# Patient Record
Sex: Female | Born: 1935 | Race: Black or African American | Hispanic: No | Smoking: Never smoker
Health system: Southern US, Community
[De-identification: ages and names within clinical notes are randomized; demographics above are authoritative.]

## PROBLEM LIST (undated history)

## (undated) DIAGNOSIS — I4891 Unspecified atrial fibrillation: Secondary | ICD-10-CM

## (undated) DIAGNOSIS — I2510 Atherosclerotic heart disease of native coronary artery without angina pectoris: Secondary | ICD-10-CM

## (undated) DIAGNOSIS — Z950 Presence of cardiac pacemaker: Secondary | ICD-10-CM

## (undated) DIAGNOSIS — D649 Anemia, unspecified: Secondary | ICD-10-CM

## (undated) DIAGNOSIS — J45909 Unspecified asthma, uncomplicated: Secondary | ICD-10-CM

## (undated) DIAGNOSIS — I639 Cerebral infarction, unspecified: Secondary | ICD-10-CM

## (undated) DIAGNOSIS — C801 Malignant (primary) neoplasm, unspecified: Secondary | ICD-10-CM

## (undated) DIAGNOSIS — K635 Polyp of colon: Secondary | ICD-10-CM

## (undated) HISTORY — PX: CARDIAC SURGERY: SHX584

## (undated) HISTORY — PX: CORONARY ANGIOPLASTY WITH STENT PLACEMENT: SHX49

## (undated) HISTORY — PX: CORONARY ARTERY BYPASS GRAFT: SHX141

## (undated) HISTORY — PX: ABDOMINAL HYSTERECTOMY: SHX81

---

## 2011-08-18 ENCOUNTER — Emergency Department (HOSPITAL_COMMUNITY): Payer: Medicare Other

## 2011-08-18 ENCOUNTER — Inpatient Hospital Stay (HOSPITAL_COMMUNITY)
Admission: EM | Admit: 2011-08-18 | Discharge: 2011-08-23 | DRG: 064 | Disposition: A | Discharge status: Rehabilitation Facility | Payer: Medicare Other | Attending: Neurology | Admitting: Neurology

## 2011-08-18 ENCOUNTER — Encounter (HOSPITAL_COMMUNITY): Payer: Self-pay | Admitting: *Deleted

## 2011-08-18 DIAGNOSIS — I639 Cerebral infarction, unspecified: Secondary | ICD-10-CM | POA: Diagnosis not present

## 2011-08-18 DIAGNOSIS — D649 Anemia, unspecified: Secondary | ICD-10-CM

## 2011-08-18 DIAGNOSIS — Z951 Presence of aortocoronary bypass graft: Secondary | ICD-10-CM

## 2011-08-18 DIAGNOSIS — Z8673 Personal history of transient ischemic attack (TIA), and cerebral infarction without residual deficits: Secondary | ICD-10-CM

## 2011-08-18 DIAGNOSIS — F29 Unspecified psychosis not due to a substance or known physiological condition: Secondary | ICD-10-CM

## 2011-08-18 DIAGNOSIS — E119 Type 2 diabetes mellitus without complications: Secondary | ICD-10-CM | POA: Diagnosis present

## 2011-08-18 DIAGNOSIS — Z950 Presence of cardiac pacemaker: Secondary | ICD-10-CM | POA: Diagnosis present

## 2011-08-18 DIAGNOSIS — Z9889 Other specified postprocedural states: Secondary | ICD-10-CM

## 2011-08-18 DIAGNOSIS — R531 Weakness: Secondary | ICD-10-CM | POA: Diagnosis present

## 2011-08-18 DIAGNOSIS — N289 Disorder of kidney and ureter, unspecified: Secondary | ICD-10-CM | POA: Diagnosis present

## 2011-08-18 DIAGNOSIS — IMO0001 Reserved for inherently not codable concepts without codable children: Secondary | ICD-10-CM | POA: Diagnosis present

## 2011-08-18 DIAGNOSIS — E876 Hypokalemia: Secondary | ICD-10-CM | POA: Diagnosis present

## 2011-08-18 DIAGNOSIS — Z7982 Long term (current) use of aspirin: Secondary | ICD-10-CM

## 2011-08-18 DIAGNOSIS — J449 Chronic obstructive pulmonary disease, unspecified: Secondary | ICD-10-CM | POA: Diagnosis present

## 2011-08-18 DIAGNOSIS — I509 Heart failure, unspecified: Secondary | ICD-10-CM | POA: Diagnosis present

## 2011-08-18 DIAGNOSIS — N390 Urinary tract infection, site not specified: Secondary | ICD-10-CM | POA: Diagnosis present

## 2011-08-18 DIAGNOSIS — R29810 Facial weakness: Secondary | ICD-10-CM | POA: Diagnosis present

## 2011-08-18 DIAGNOSIS — J45909 Unspecified asthma, uncomplicated: Secondary | ICD-10-CM | POA: Diagnosis present

## 2011-08-18 DIAGNOSIS — I2510 Atherosclerotic heart disease of native coronary artery without angina pectoris: Secondary | ICD-10-CM | POA: Diagnosis present

## 2011-08-18 DIAGNOSIS — I6521 Occlusion and stenosis of right carotid artery: Secondary | ICD-10-CM

## 2011-08-18 DIAGNOSIS — G8190 Hemiplegia, unspecified affecting unspecified side: Secondary | ICD-10-CM | POA: Diagnosis present

## 2011-08-18 DIAGNOSIS — R5381 Other malaise: Secondary | ICD-10-CM

## 2011-08-18 DIAGNOSIS — R410 Disorientation, unspecified: Secondary | ICD-10-CM | POA: Diagnosis present

## 2011-08-18 DIAGNOSIS — J4489 Other specified chronic obstructive pulmonary disease: Secondary | ICD-10-CM | POA: Diagnosis present

## 2011-08-18 DIAGNOSIS — G9340 Encephalopathy, unspecified: Secondary | ICD-10-CM | POA: Diagnosis present

## 2011-08-18 DIAGNOSIS — R739 Hyperglycemia, unspecified: Secondary | ICD-10-CM

## 2011-08-18 DIAGNOSIS — Z87898 Personal history of other specified conditions: Secondary | ICD-10-CM | POA: Diagnosis present

## 2011-08-18 DIAGNOSIS — I63239 Cerebral infarction due to unspecified occlusion or stenosis of unspecified carotid arteries: Principal | ICD-10-CM | POA: Diagnosis present

## 2011-08-18 HISTORY — DX: Unspecified asthma, uncomplicated: J45.909

## 2011-08-18 HISTORY — DX: Cerebral infarction, unspecified: I63.9

## 2011-08-18 HISTORY — DX: Presence of cardiac pacemaker: Z95.0

## 2011-08-18 HISTORY — DX: Atherosclerotic heart disease of native coronary artery without angina pectoris: I25.10

## 2011-08-18 LAB — URINALYSIS, ROUTINE W REFLEX MICROSCOPIC
Ketones, ur: NEGATIVE mg/dL
Nitrite: NEGATIVE
Protein, ur: 30 mg/dL — AB
Urobilinogen, UA: 0.2 mg/dL (ref 0.0–1.0)

## 2011-08-18 LAB — CBC WITH DIFFERENTIAL/PLATELET
Basophils Relative: 0 % (ref 0–1)
Eosinophils Absolute: 0 10*3/uL (ref 0.0–0.7)
Eosinophils Relative: 0 % (ref 0–5)
Lymphs Abs: 0.7 10*3/uL (ref 0.7–4.0)
MCH: 24.5 pg — ABNORMAL LOW (ref 26.0–34.0)
MCHC: 31.8 g/dL (ref 30.0–36.0)
MCV: 77 fL — ABNORMAL LOW (ref 78.0–100.0)
Monocytes Absolute: 0.7 10*3/uL (ref 0.1–1.0)
Neutrophils Relative %: 86 % — ABNORMAL HIGH (ref 43–77)
Platelets: 207 10*3/uL (ref 150–400)
RBC: 4 MIL/uL (ref 3.87–5.11)
WBC Morphology: INCREASED

## 2011-08-18 LAB — COMPREHENSIVE METABOLIC PANEL
Alkaline Phosphatase: 75 U/L (ref 39–117)
BUN: 29 mg/dL — ABNORMAL HIGH (ref 6–23)
Chloride: 105 mEq/L (ref 96–112)
Creatinine, Ser: 1.43 mg/dL — ABNORMAL HIGH (ref 0.50–1.10)
GFR calc Af Amer: 37 mL/min — ABNORMAL LOW (ref >90)
Glucose, Bld: 355 mg/dL — ABNORMAL HIGH (ref 70–99)
Potassium: 2.9 mEq/L — ABNORMAL LOW (ref 3.5–5.1)
Total Bilirubin: 1.5 mg/dL — ABNORMAL HIGH (ref 0.3–1.2)

## 2011-08-18 LAB — TROPONIN I: Troponin I: <0.3 ng/mL (ref <0.30)

## 2011-08-18 LAB — URINE MICROSCOPIC-ADD ON

## 2011-08-18 LAB — RETICULOCYTES: Retic Ct Pct: 1 % (ref 0.4–3.1)

## 2011-08-18 LAB — PRO B NATRIURETIC PEPTIDE: Pro B Natriuretic peptide (BNP): 7537 pg/mL — ABNORMAL HIGH (ref 0–450)

## 2011-08-18 LAB — PROCALCITONIN: Procalcitonin: 3.78 ng/mL

## 2011-08-18 MED ORDER — ONDANSETRON HCL 4 MG PO TABS: 4.0000 mg | ORAL_TABLET | Freq: Four times a day (QID) | ORAL | Status: DC | PRN

## 2011-08-18 MED ORDER — POTASSIUM CHLORIDE CRYS ER 20 MEQ PO TBCR
20.0000 meq | EXTENDED_RELEASE_TABLET | Freq: Two times a day (BID) | ORAL | Status: DC
  Administered 2011-08-18 – 2011-08-23 (×8): 20 meq via ORAL
  Filled 2011-08-18 (×10): qty 1

## 2011-08-18 MED ORDER — SODIUM CHLORIDE 0.9 % IJ SOLN
3.0000 mL | Freq: Two times a day (BID) | INTRAMUSCULAR | Status: DC
  Administered 2011-08-20 – 2011-08-22 (×6): 3 mL via INTRAVENOUS

## 2011-08-18 MED ORDER — DEXTROSE 5 % IV SOLN
1.0000 g | INTRAVENOUS | Status: DC
  Administered 2011-08-19 – 2011-08-22 (×4): 1 g via INTRAVENOUS
  Filled 2011-08-18 (×6): qty 10

## 2011-08-18 MED ORDER — ENOXAPARIN SODIUM 30 MG/0.3ML ~~LOC~~ SOLN
30.0000 mg | SUBCUTANEOUS | Status: DC
  Administered 2011-08-18: 30 mg via SUBCUTANEOUS
  Filled 2011-08-18 (×2): qty 0.3

## 2011-08-18 MED ORDER — ISOSORBIDE MONONITRATE ER 30 MG PO TB24
30.0000 mg | ORAL_TABLET | Freq: Every day | ORAL | Status: DC
  Administered 2011-08-19 – 2011-08-23 (×4): 30 mg via ORAL
  Filled 2011-08-18 (×6): qty 1

## 2011-08-18 MED ORDER — LATANOPROST 0.005 % OP SOLN
1.0000 [drp] | Freq: Every day | OPHTHALMIC | Status: DC
  Administered 2011-08-20 – 2011-08-22 (×3): 1 [drp] via OPHTHALMIC
  Filled 2011-08-18 (×2): qty 2.5

## 2011-08-18 MED ORDER — ALBUTEROL SULFATE (5 MG/ML) 0.5% IN NEBU
2.5000 mg | INHALATION_SOLUTION | Freq: Four times a day (QID) | RESPIRATORY_TRACT | Status: DC | PRN
  Administered 2011-08-19: 2.5 mg via RESPIRATORY_TRACT
  Filled 2011-08-18: qty 0.5

## 2011-08-18 MED ORDER — SODIUM CHLORIDE 0.9 % IJ SOLN
3.0000 mL | Freq: Two times a day (BID) | INTRAMUSCULAR | Status: DC
  Administered 2011-08-18: 3 mL via INTRAVENOUS
  Filled 2011-08-18: qty 3

## 2011-08-18 MED ORDER — SODIUM CHLORIDE 0.9 % IV SOLN: 250.0000 mL | INTRAVENOUS | Status: DC | PRN

## 2011-08-18 MED ORDER — SODIUM CHLORIDE 0.9 % IJ SOLN: 3.0000 mL | INTRAMUSCULAR | Status: DC | PRN

## 2011-08-18 MED ORDER — ASPIRIN EC 81 MG PO TBEC
81.0000 mg | DELAYED_RELEASE_TABLET | Freq: Every day | ORAL | Status: DC
  Administered 2011-08-19: 81 mg via ORAL
  Filled 2011-08-18 (×2): qty 1

## 2011-08-18 MED ORDER — CLONIDINE HCL 0.2 MG/24HR TD PTWK
0.2000 mg | MEDICATED_PATCH | TRANSDERMAL | Status: DC
  Administered 2011-08-18: 0.2 mg via TRANSDERMAL

## 2011-08-18 MED ORDER — INSULIN ASPART 100 UNIT/ML ~~LOC~~ SOLN
0.0000 [IU] | Freq: Three times a day (TID) | SUBCUTANEOUS | Status: DC
  Administered 2011-08-19: 5 [IU] via SUBCUTANEOUS

## 2011-08-18 MED ORDER — CLONIDINE HCL 0.2 MG/24HR TD PTWK
MEDICATED_PATCH | TRANSDERMAL | Status: AC
  Filled 2011-08-18: qty 1

## 2011-08-18 MED ORDER — DEXTROSE 5 % IV SOLN
1.0000 g | Freq: Once | INTRAVENOUS | Status: AC
  Administered 2011-08-18: 1 g via INTRAVENOUS
  Filled 2011-08-18: qty 10

## 2011-08-18 MED ORDER — ONDANSETRON HCL 4 MG/2ML IJ SOLN
4.0000 mg | Freq: Four times a day (QID) | INTRAMUSCULAR | Status: DC | PRN
  Administered 2011-08-19: 4 mg via INTRAVENOUS
  Filled 2011-08-18: qty 2

## 2011-08-18 MED ORDER — METOPROLOL TARTRATE 50 MG PO TABS
50.0000 mg | ORAL_TABLET | Freq: Two times a day (BID) | ORAL | Status: DC
  Administered 2011-08-18: 50 mg via ORAL
  Filled 2011-08-18: qty 1

## 2011-08-18 MED ORDER — ENOXAPARIN SODIUM 40 MG/0.4ML ~~LOC~~ SOLN: 40.0000 mg | SUBCUTANEOUS | Status: DC

## 2011-08-18 NOTE — ED Notes (Signed)
Pt c/o SOB, polyuria as well as malodorous urine. Pt denied any pain or discomfort. Pt does not appear to be in distress. Pt has been taking albuterol for SOB with relief however, it has not been affective in past few days.

## 2011-08-18 NOTE — H&P (Signed)
Chief Complaint:  weak  HPI: 78 yo female h/o cri, chf unknown ef, s/p cabg, who lives with her son who is taking excellent care of her and is very knowledgeable about her health problems comes in with several days of worsening generalized weakness, low grade fevers, increase in urinary frequency and new incontinence along with a very fowl smelling odor to urine.  At her baseline she gets up and moves around very well walking around the house without a walker but the last several days has been basically bed bound and needing much more help to get up.  Son gives her her diuretics prn based on how much swelling she has in her legs, there has been no swelling in her legs over the last several weeks.  Has not noticed any worsening sob, patient does not complain of any cp, sob, n/v,d.  She does not frequently require abx tx for infections.  There has been no notice of slurred speech or any focal weakness in any extremeties.  No bleeding issues.  She has also had decreased po intake for several days.  Review of Systems:  O/w neg  Past Medical History: Past Medical History  Diagnosis Date  . Coronary artery disease   . Diabetes mellitus   . Pacemaker   . Asthma    Past Surgical History  Procedure Date  . Cardiac surgery   . Coronary angioplasty with stent placement   . Coronary artery bypass graft   . Abdominal hysterectomy   . Colon polpys     Medications: Prior to Admission medications   Medication Sig Start Date End Date Taking? Authorizing Provider  aspirin EC 81 MG tablet Take 81 mg by mouth every morning.   Yes Historical Provider, MD  bumetanide (BUMEX) 1 MG tablet Take 1 mg by mouth daily. *Only take if fluid retention is current. Provided via son discretion. Patient is to take Klor-Con with this medication*   Yes Historical Provider, MD  cloNIDine (CATAPRES - DOSED IN MG/24 HR) 0.2 mg/24hr patch Place 1 patch onto the skin once a week.   Yes Historical Provider, MD    insulin lispro (HUMALOG) 100 UNIT/ML injection Inject 5-10 Units into the skin 3 (three) times daily before meals. *Inject 5 units in the morning and 10 units at lunch and at dinner*   Yes Historical Provider, MD  isosorbide mononitrate (IMDUR) 30 MG 24 hr tablet Take 30 mg by mouth daily.   Yes Historical Provider, MD  metoprolol (LOPRESSOR) 50 MG tablet Take 50 mg by mouth 2 (two) times daily.   Yes Historical Provider, MD  potassium chloride SA (K-DUR,KLOR-CON) 20 MEQ tablet Take 20 mEq by mouth daily. *To be taken when Bumex is taken for fluid retention*   Yes Historical Provider, MD    Allergies:   Allergies  Allergen Reactions  . Lisinopril     Social History:  reports that she has never smoked. She does not have any smokeless tobacco history on file. She reports that she does not drink alcohol or use illicit drugs.  Family History: History reviewed. No pertinent family history.  Physical Exam: Filed Vitals:   08/18/11 1445 08/18/11 1735 08/18/11 1927 08/18/11 2040  BP: 124/44  139/60 165/71  Pulse: 59  59   Temp: 100 F (37.8 C) 99.5 F (37.5 C)  98.4 F (36.9 C)  TempSrc: Oral Rectal  Oral  Resp: 20  18 17   Height:    5\' 2"  (1.575 m)  Weight:  59.1 kg (130 lb 4.7 oz)  SpO2: 100%  99%    General appearance: alert, cooperative and no distress Neck: no JVD and supple, symmetrical, trachea midline Lungs: clear to auscultation bilaterally Heart: regular rate and rhythm, S1, S2 normal, no murmur, click, rub or gallop Abdomen: soft, non-tender; bowel sounds normal; no masses,  no organomegaly Extremities: extremities normal, atraumatic, no cyanosis or edema Pulses: 2+ and symmetric Skin: Skin color, texture, turgor normal. No rashes or lesions Neurologic: Grossly normal    Labs on Admission:   [facility] 08/18/11 1658  NA 144  K 2.9*  CL 105  CO2 28  GLUCOSE 355*  BUN 29*  CREATININE 1.43*  CALCIUM 8.8  MG --  PHOS --    Basename 08/18/11 1658  AST 15   ALT 11  ALKPHOS 75  BILITOT 1.5*  PROT 6.2  ALBUMIN 2.4*    Basename 08/18/11 1658  WBC 10.6*  NEUTROABS 9.2*  HGB 9.8*  HCT 30.8*  MCV 77.0*  PLT 207    Basename 08/18/11 1658  CKTOTAL --  CKMB --  CKMBINDEX --  TROPONINI <0.30   Radiological Exams on Admission: Dg Chest Portable 1 View  08/18/2011  *RADIOLOGY REPORT*  Clinical Data: Shortness of breath.  PORTABLE CHEST - 1 VIEW  Comparison: None.  Findings: Trachea is midline.  Heart is enlarged.  Pacemaker lead tip projects over the right ventricle.  Lungs are clear.  No pleural fluid.  IMPRESSION: No acute findings.  Original Report Authenticated By: Reyes Ivan, M.D.    Assessment/Plan Present on Admission:  78 yo female with generalized weakness from uti .UTI (urinary tract infection) .Confusion .Generalized weakness .Renal insufficiency, mild .Coronary artery disease .Asthma .Anemia .Hypokalemia .Diabetes mellitus .CHF, chronic .S/P placement of cardiac pacemaker .H/O angioedema  rocephin for uti and cx sent.  Her chf is compensated and will ck an echo to assess her lv function.  No prior visits here so no old records.  Mild renal insuff no baseline.  Will hold off on ivf due to her h/o chf unknown EF as she is still taking po intake.  Hold diuretics.  Ck anemia panel.  Place on ssi.  Obtain PT eval.  Son has no help at home, will need HH/pt set up at d/c for home.  Increase her po potassium intake.  DAVID,RACHAL A [id number] 08/18/2011, 9:17 PM

## 2011-08-18 NOTE — ED Provider Notes (Cosign Needed)
History    This chart was scribed for Ward Givens, MD, MD by Smitty Pluck. The patient was seen in room APA14 and the patient's care was started at 4:20PM.   CSN: [id number]  Arrival date & time 08/18/11  1431   First MD Initiated Contact with Patient 08/18/11 1550      Chief Complaint  Patient presents with  . Shortness of Breath    (Consider location/radiation/quality/duration/timing/severity/associated sxs/prior Treatment) Level V caveat for confusion  The history is provided by the patient and a relative.   Cindy Abbott is a 78 y.o. female who presents to the Emergency Department BIB family due to increased urinary incontinence, increased frequency of urination with foul smell and SOB onset 7 days ago. Pt's son reports pt's urine is dark  in color and has odor. Son reports that pt has had increased confusion within the past week. Pt is ambulatory at home. Patient has chronic shortness of breath. She denies chest pain, nausea, and diarrhea, swelling of her feet. Son denies fever that he is aware of.    Pt has hx pacemaker, bypass surgery, 5 stints placed to prepare her for polyp removal surgery at [facility].   PCP is Dr. Graciela Husbands at [facility] in [place], [place]   Past Medical History  Diagnosis Date  . Coronary artery disease   . Diabetes mellitus   . Pacemaker   . Asthma     Past Surgical History  Procedure Date  . Cardiac surgery   . Coronary angioplasty with stent placement x5 stents    . Coronary artery bypass graft  2000  . Abdominal hysterectomy   . Colon polpys   Pacemaker--[employer]  History reviewed. No pertinent family history.  History  Substance Use Topics  . Smoking status: Never Smoker   . Smokeless tobacco: Not on file  . Alcohol Use: No  lives with son  OB History    Grav Para Term Preterm Abortions TAB SAB Ect Mult Living                  Review of Systems  All other systems reviewed and are negative.  10 Systems reviewed and all are  negative for acute change except as noted in the HPI.    Allergies  Lisinopril  Home Medications   Current Outpatient Rx  Name Route Sig Dispense Refill  . ASPIRIN EC 81 MG PO TBEC Oral Take 81 mg by mouth every morning.    . BUMETANIDE 1 MG PO TABS Oral Take 1 mg by mouth daily. *Only take if fluid retention is current. Provided via son discretion. Patient is to take Klor-Con with this medication*    . CLONIDINE HCL 0.2 MG/24HR TD PTWK Transdermal Place 1 patch onto the skin once a week.    . INSULIN LISPRO (HUMAN) 100 UNIT/ML [place] SOLN Subcutaneous Inject 5-10 Units into the skin 3 (three) times daily before meals. *Inject 5 units in the morning and 10 units at lunch and at dinner*    . ISOSORBIDE MONONITRATE ER 30 MG PO TB24 Oral Take 30 mg by mouth daily.    Marland Kitchen METOPROLOL TARTRATE 50 MG PO TABS Oral Take 50 mg by mouth 2 (two) times daily.    Marland Kitchen POTASSIUM CHLORIDE CRYS ER 20 MEQ PO TBCR Oral Take 20 mEq by mouth daily. *To be taken when Bumex is taken for fluid retention*      BP 124/44  Pulse 59  Temp 100 F (37.8 C) (Oral)  Resp 20  SpO2 100%  Vital signs normal borderline bradycardia   Physical Exam  Nursing note and vitals reviewed. Constitutional: She appears well-developed and well-nourished.  Non-toxic appearance. She does not appear ill. No distress.       Seems generally weak   HENT:  Head: Normocephalic and atraumatic.  Right Ear: External ear normal.  Left Ear: External ear normal.  Nose: Nose normal. No mucosal edema or rhinorrhea.  Mouth/Throat: Oropharynx is clear and moist and mucous membranes are normal. No dental abscesses or uvula swelling.       Tongue is dry  Eyes: Conjunctivae and EOM are normal. Pupils are equal, round, and reactive to light.  Neck: Normal range of motion and full passive range of motion without pain. Neck supple.  Cardiovascular: Normal rate, regular rhythm and normal heart sounds.  Exam reveals no gallop and no friction rub.     No murmur heard.      harsh crescendo systolic murmur   Pulmonary/Chest: Breath sounds normal. Tachypnea noted. No respiratory distress. She has no wheezes. She has no rhonchi. She has no rales. She exhibits no tenderness and no crepitus.  Abdominal: Soft. Normal appearance and bowel sounds are normal. She exhibits no distension. There is no tenderness. There is no rebound and no guarding.  Musculoskeletal: Normal range of motion. She exhibits no edema and no tenderness.       Moves all extremities well.   Neurological: She is alert. She has normal strength. No cranial nerve deficit.       Pt thinks she is at [facility] in [place]  Skin: Skin is warm, dry and intact. No rash noted. No erythema. No pallor.  Psychiatric: She has a normal mood and affect. Her speech is normal and behavior is normal. Her mood appears not anxious.       Flat affect     ED Course  Procedures (including critical care time)   Medications  cefTRIAXone (ROCEPHIN) 1 g in dextrose 5 % 50 mL IVPB (1 g Intravenous New Bag/Given 08/18/11 1850)    DIAGNOSTIC STUDIES: Oxygen Saturation is 100% on room air, normal by my interpretation.    COORDINATION OF CARE: 4:31PM EDP discusses pt ED treatment with pt  6:48PM EDP rechecks pt and discuss lab results with pt's son  1905 Dr Onalee Hua, admit to tele, team 1  Results for orders placed during the hospital encounter of 08/18/11  CBC WITH DIFFERENTIAL      Component Value Range   WBC 10.6 (*) 4.0 - 10.5 K/uL   RBC 4.00  3.87 - 5.11 MIL/uL   Hemoglobin 9.8 (*) 12.0 - 15.0 g/dL   HCT 16.1 (*) 09.6 - 04.5 %   MCV 77.0 (*) 78.0 - 100.0 fL   MCH 24.5 (*) 26.0 - 34.0 pg   MCHC 31.8  30.0 - 36.0 g/dL   RDW 40.9 (*) 81.1 - 91.4 %   Platelets 207  150 - 400 K/uL   Neutrophils Relative 86 (*) 43 - 77 %   Lymphocytes Relative 7 (*) 12 - 46 %   Monocytes Relative 7  3 - 12 %   Eosinophils Relative 0  0 - 5 %   Basophils Relative 0  0 - 1 %   Neutro Abs 9.2 (*) 1.7 - 7.7 K/uL   Lymphs  Abs 0.7  0.7 - 4.0 K/uL   Monocytes Absolute 0.7  0.1 - 1.0 K/uL   Eosinophils Absolute 0.0  0.0 - 0.7 K/uL  Basophils Absolute 0.0  0.0 - 0.1 K/uL   RBC Morphology SCHISTOCYTES PRESENT (2-5/hpf)     WBC Morphology INCREASED BANDS (>20% BANDS)     Smear Review LARGE PLATELETS PRESENT    COMPREHENSIVE METABOLIC PANEL      Component Value Range   Sodium 144  135 - 145 mEq/L   Potassium 2.9 (*) 3.5 - 5.1 mEq/L   Chloride 105  96 - 112 mEq/L   CO2 28  19 - 32 mEq/L   Glucose, Bld 355 (*) 70 - 99 mg/dL   BUN 29 (*) 6 - 23 mg/dL   Creatinine, Ser 1.61 (*) 0.50 - 1.10 mg/dL   Calcium 8.8  8.4 - 09.6 mg/dL   Total Protein 6.2  6.0 - 8.3 g/dL   Albumin 2.4 (*) 3.5 - 5.2 g/dL   AST 15  0 - 37 U/L   ALT 11  0 - 35 U/L   Alkaline Phosphatase 75  39 - 117 U/L   Total Bilirubin 1.5 (*) 0.3 - 1.2 mg/dL   GFR calc non Af Amer 32 (*) >90 mL/min   GFR calc Af Amer 37 (*) >90 mL/min  URINALYSIS, ROUTINE W REFLEX MICROSCOPIC      Component Value Range   Color, Urine YELLOW  YELLOW   APPearance HAZY (*) CLEAR   Specific Gravity, Urine 1.020  1.005 - 1.030   pH 5.5  5.0 - 8.0   Glucose, UA >1000 (*) NEGATIVE mg/dL   Hgb urine dipstick MODERATE (*) NEGATIVE   Bilirubin Urine NEGATIVE  NEGATIVE   Ketones, ur NEGATIVE  NEGATIVE mg/dL   Protein, ur 30 (*) NEGATIVE mg/dL   Urobilinogen, UA 0.2  0.0 - 1.0 mg/dL   Nitrite NEGATIVE  NEGATIVE   Leukocytes, UA SMALL (*) NEGATIVE  PRO B NATRIURETIC PEPTIDE      Component Value Range   Pro B Natriuretic peptide (BNP) 7537.0 (*) 0 - 450 pg/mL  TROPONIN I      Component Value Range   Troponin I <0.30  <0.30 ng/mL  URINE MICROSCOPIC-ADD ON      Component Value Range   Squamous Epithelial / LPF RARE  RARE   WBC, UA TOO NUMEROUS TO COUNT  <3 WBC/hpf   RBC / HPF 3-6  <3 RBC/hpf   Bacteria, UA MANY (*) RARE   Laboratory interpretation all normal except urinary tract infection, elevated BNP, hyperglycemia, renal insufficiency, anemia, please note there  are no old labs to compare to   Dg Chest Portable 1 View  08/18/2011  *RADIOLOGY REPORT*  Clinical Data: Shortness of breath.  PORTABLE CHEST - 1 VIEW  Comparison: None.  Findings: Trachea is midline.  Heart is enlarged.  Pacemaker lead tip projects over the right ventricle.  Lungs are clear.  No pleural fluid.  IMPRESSION: No acute findings.  Original Report Authenticated By: Reyes Ivan, M.D.    Date: 08/18/2011  Rate: 95  Rhythm: demand pacemaker  QRS Axis:   Intervals:   ST/T Wave abnormalities:  Conduction Disutrbances  Narrative Interpretation:   Old EKG Reviewed: none available    1. Urinary tract infection   2. Confusion   3. Renal insufficiency   4. Anemia   5. Hyperglycemia   6. Coronary artery disease   7. Asthma    Plan admission  Devoria Albe, MD, FACEP    MDM   I personally performed the services described in this documentation, which was scribed in my presence. The recorded information has been  reviewed and considered.  Devoria Albe, MD, Armando Gang      Ward Givens, MD 08/18/11 [phone]

## 2011-08-18 NOTE — ED Notes (Signed)
Sob for breath , frequency of urination with odor. Cindy Abbott,.

## 2011-08-19 ENCOUNTER — Inpatient Hospital Stay (HOSPITAL_COMMUNITY): Payer: Medicare Other

## 2011-08-19 ENCOUNTER — Encounter (HOSPITAL_COMMUNITY): Payer: Self-pay | Admitting: Internal Medicine

## 2011-08-19 DIAGNOSIS — I6330 Cerebral infarction due to thrombosis of unspecified cerebral artery: Secondary | ICD-10-CM

## 2011-08-19 DIAGNOSIS — N289 Disorder of kidney and ureter, unspecified: Secondary | ICD-10-CM

## 2011-08-19 DIAGNOSIS — I509 Heart failure, unspecified: Secondary | ICD-10-CM

## 2011-08-19 DIAGNOSIS — I2510 Atherosclerotic heart disease of native coronary artery without angina pectoris: Secondary | ICD-10-CM

## 2011-08-19 DIAGNOSIS — I059 Rheumatic mitral valve disease, unspecified: Secondary | ICD-10-CM

## 2011-08-19 DIAGNOSIS — I639 Cerebral infarction, unspecified: Secondary | ICD-10-CM | POA: Diagnosis present

## 2011-08-19 DIAGNOSIS — D649 Anemia, unspecified: Secondary | ICD-10-CM

## 2011-08-19 DIAGNOSIS — J45909 Unspecified asthma, uncomplicated: Secondary | ICD-10-CM

## 2011-08-19 DIAGNOSIS — E876 Hypokalemia: Secondary | ICD-10-CM

## 2011-08-19 DIAGNOSIS — N390 Urinary tract infection, site not specified: Secondary | ICD-10-CM

## 2011-08-19 HISTORY — DX: Cerebral infarction, unspecified: I63.9

## 2011-08-19 LAB — BASIC METABOLIC PANEL
BUN: 31 mg/dL — ABNORMAL HIGH (ref 6–23)
CO2: 24 mEq/L (ref 19–32)
Chloride: 105 mEq/L (ref 96–112)
Creatinine, Ser: 1.3 mg/dL — ABNORMAL HIGH (ref 0.50–1.10)
Glucose, Bld: 313 mg/dL — ABNORMAL HIGH (ref 70–99)

## 2011-08-19 LAB — PROTIME-INR
INR: 1.11 (ref 0.00–1.49)
Prothrombin Time: 14.5 seconds (ref 11.6–15.2)

## 2011-08-19 LAB — CBC
HCT: 28.5 % — ABNORMAL LOW (ref 36.0–46.0)
MCH: 24.6 pg — ABNORMAL LOW (ref 26.0–34.0)
MCV: 76.2 fL — ABNORMAL LOW (ref 78.0–100.0)
RBC: 3.74 MIL/uL — ABNORMAL LOW (ref 3.87–5.11)
WBC: 11.3 10*3/uL — ABNORMAL HIGH (ref 4.0–10.5)

## 2011-08-19 LAB — IRON AND TIBC
Saturation Ratios: 6 % — ABNORMAL LOW (ref 20–55)
TIBC: 284 ug/dL (ref 250–470)
UIBC: 266 ug/dL (ref 125–400)

## 2011-08-19 LAB — MAGNESIUM: Magnesium: 1.6 mg/dL (ref 1.5–2.5)

## 2011-08-19 LAB — GLUCOSE, CAPILLARY: Glucose-Capillary: 211 mg/dL — ABNORMAL HIGH (ref 70–99)

## 2011-08-19 LAB — TSH: TSH: 0.665 u[IU]/mL (ref 0.350–4.500)

## 2011-08-19 LAB — MRSA PCR SCREENING: MRSA by PCR: NEGATIVE

## 2011-08-19 LAB — FERRITIN: Ferritin: 83 ng/mL (ref 10–291)

## 2011-08-19 LAB — HEMOGLOBIN A1C: Mean Plasma Glucose: 174 mg/dL — ABNORMAL HIGH (ref <117)

## 2011-08-19 LAB — APTT: aPTT: 33 seconds (ref 24–37)

## 2011-08-19 MED ORDER — INSULIN GLARGINE 100 UNIT/ML ~~LOC~~ SOLN
5.0000 [IU] | Freq: Every day | SUBCUTANEOUS | Status: DC
  Administered 2011-08-20 – 2011-08-21 (×2): 5 [IU] via SUBCUTANEOUS

## 2011-08-19 MED ORDER — ALTEPLASE 100 MG IV SOLR
INTRAVENOUS | Status: AC
  Filled 2011-08-19: qty 100

## 2011-08-19 MED ORDER — SENNOSIDES-DOCUSATE SODIUM 8.6-50 MG PO TABS
1.0000 | ORAL_TABLET | Freq: Every evening | ORAL | Status: DC | PRN
  Administered 2011-08-23: 1 via ORAL
  Filled 2011-08-19 (×2): qty 1

## 2011-08-19 MED ORDER — POTASSIUM CHLORIDE IN NACL 40-0.9 MEQ/L-% IV SOLN
INTRAVENOUS | Status: DC
  Administered 2011-08-19: 09:00:00 via INTRAVENOUS
  Filled 2011-08-19 (×3): qty 1000

## 2011-08-19 MED ORDER — HYDRALAZINE HCL 20 MG/ML IJ SOLN: 10.0000 mg | Freq: Once | INTRAMUSCULAR | Status: DC

## 2011-08-19 MED ORDER — HYDRALAZINE HCL 20 MG/ML IJ SOLN
INTRAMUSCULAR | Status: AC
  Administered 2011-08-19: 10 mg
  Filled 2011-08-19: qty 1

## 2011-08-19 MED ORDER — INSULIN ASPART 100 UNIT/ML ~~LOC~~ SOLN
0.0000 [IU] | Freq: Three times a day (TID) | SUBCUTANEOUS | Status: DC
  Administered 2011-08-19: 5 [IU] via SUBCUTANEOUS
  Administered 2011-08-19: 11 [IU] via SUBCUTANEOUS
  Administered 2011-08-20: 3 [IU] via SUBCUTANEOUS
  Administered 2011-08-20 – 2011-08-21 (×3): 5 [IU] via SUBCUTANEOUS
  Administered 2011-08-21 (×2): 8 [IU] via SUBCUTANEOUS
  Administered 2011-08-22: 3 [IU] via SUBCUTANEOUS

## 2011-08-19 MED ORDER — ALBUTEROL SULFATE (5 MG/ML) 0.5% IN NEBU
2.5000 mg | INHALATION_SOLUTION | RESPIRATORY_TRACT | Status: DC | PRN
  Administered 2011-08-19: 2.5 mg via RESPIRATORY_TRACT
  Filled 2011-08-19: qty 0.5

## 2011-08-19 MED ORDER — PANTOPRAZOLE SODIUM 40 MG IV SOLR
40.0000 mg | Freq: Every day | INTRAVENOUS | Status: DC
  Administered 2011-08-20: 40 mg via INTRAVENOUS
  Filled 2011-08-19 (×3): qty 40

## 2011-08-19 MED ORDER — IPRATROPIUM BROMIDE 0.02 % IN SOLN
0.5000 mg | Freq: Four times a day (QID) | RESPIRATORY_TRACT | Status: DC
  Administered 2011-08-20 – 2011-08-21 (×8): 0.5 mg via RESPIRATORY_TRACT
  Filled 2011-08-19 (×8): qty 2.5

## 2011-08-19 MED ORDER — ALBUTEROL SULFATE (5 MG/ML) 0.5% IN NEBU
2.5000 mg | INHALATION_SOLUTION | RESPIRATORY_TRACT | Status: DC | PRN
  Administered 2011-08-23: 2.5 mg via RESPIRATORY_TRACT
  Filled 2011-08-19: qty 0.5

## 2011-08-19 MED ORDER — SODIUM CHLORIDE 0.9 % IV SOLN
INTRAVENOUS | Status: DC
  Administered 2011-08-19: 23:00:00 via INTRAVENOUS

## 2011-08-19 MED ORDER — ACETAMINOPHEN 325 MG PO TABS: 650.0000 mg | ORAL_TABLET | ORAL | Status: DC | PRN

## 2011-08-19 MED ORDER — ACETAMINOPHEN 650 MG RE SUPP: 650.0000 mg | RECTAL | Status: DC | PRN

## 2011-08-19 MED ORDER — INSULIN ASPART 100 UNIT/ML ~~LOC~~ SOLN
0.0000 [IU] | Freq: Every day | SUBCUTANEOUS | Status: DC
  Administered 2011-08-21: 3 [IU] via SUBCUTANEOUS

## 2011-08-19 MED ORDER — METOPROLOL TARTRATE 25 MG PO TABS
25.0000 mg | ORAL_TABLET | Freq: Two times a day (BID) | ORAL | Status: DC
  Administered 2011-08-19 – 2011-08-23 (×6): 25 mg via ORAL
  Filled 2011-08-19 (×10): qty 1

## 2011-08-19 MED ORDER — ALBUTEROL SULFATE (5 MG/ML) 0.5% IN NEBU
2.5000 mg | INHALATION_SOLUTION | Freq: Four times a day (QID) | RESPIRATORY_TRACT | Status: DC
  Administered 2011-08-20 – 2011-08-21 (×8): 2.5 mg via RESPIRATORY_TRACT
  Filled 2011-08-19 (×8): qty 0.5

## 2011-08-19 MED ORDER — ALTEPLASE (STROKE) FULL DOSE INFUSION
0.9000 mg/kg | Freq: Once | INTRAVENOUS | Status: AC
  Administered 2011-08-19: 53 mg via INTRAVENOUS
  Filled 2011-08-19: qty 53

## 2011-08-19 MED ORDER — ALBUTEROL SULFATE (5 MG/ML) 0.5% IN NEBU
2.5000 mg | INHALATION_SOLUTION | Freq: Two times a day (BID) | RESPIRATORY_TRACT | Status: DC
  Administered 2011-08-19: 2.5 mg via RESPIRATORY_TRACT
  Filled 2011-08-19 (×2): qty 0.5

## 2011-08-19 MED ORDER — IPRATROPIUM BROMIDE 0.02 % IN SOLN
0.5000 mg | RESPIRATORY_TRACT | Status: DC | PRN
  Administered 2011-08-23: 0.5 mg via RESPIRATORY_TRACT
  Filled 2011-08-19: qty 2.5

## 2011-08-19 NOTE — Consult Note (Signed)
Name: Cindy Abbott MRN: [id number] DOB: [birth year]  LOS: 1  CRITICAL CARE CONSULT NOTE  History of Present Illness: 78 y/o female with CAD and asthma vs. COPD was admitted otn [facility] hospital on 08/19/11 for an acute ischaemic stroke in the setting of being hospitalized for a UTI.  Apparently she was admitted to [facility] on 8/14 for confusion due to a UTI.  On 8/15 she developed the sudden onset of left sided facial droop and weakness with slurred speech.  She received tPA and was transferred to [facility] for further management. PCCM consulted for shortness of breath which has responded to albuterol.  Lines / Drains: PIV  Cultures / Sepsis markers: 08/18/11 Urine culture > 100K GNR  Antibiotics: 8/14 Ceftriaxone (UTI) >>  Tests / Events: 8/15 CT Head >> Age related changes with atrophy and chronic microvascular ischaemic change, no hemorrhage     Past Medical History  Diagnosis Date  . Coronary artery disease   . Diabetes mellitus   . Pacemaker   . Asthma   . Stroke, acute, thrombotic 08/19/2011   Past Surgical History  Procedure Date  . Cardiac surgery   . Coronary angioplasty with stent placement   . Coronary artery bypass graft   . Abdominal hysterectomy   . Colon polpys    Prior to Admission medications   Medication Sig Start Date End Date Taking? Authorizing Provider  albuterol (PROVENTIL) (5 MG/ML) 0.5% nebulizer solution Take 2.5 mg by nebulization every 6 (six) hours as needed.   Yes Historical Provider, MD  aspirin EC 81 MG tablet Take 81 mg by mouth every morning.   Yes Historical Provider, MD  bumetanide (BUMEX) 1 MG tablet Take 1 mg by mouth daily. *Only take if fluid retention is current. Provided via son discretion. Patient is to take Klor-Con with this medication*   Yes Historical Provider, MD  cloNIDine (CATAPRES - DOSED IN MG/24 HR) 0.2 mg/24hr patch Place 1 patch onto the skin once a week.   Yes Historical Provider, MD  insulin lispro (HUMALOG) 100 UNIT/ML  injection Inject 5-10 Units into the skin 3 (three) times daily before meals. *Inject 5 units in the morning and 10 units at lunch and at dinner*   Yes Historical Provider, MD  isosorbide mononitrate (IMDUR) 30 MG 24 hr tablet Take 30 mg by mouth daily.   Yes Historical Provider, MD  latanoprost (XALATAN) 0.005 % ophthalmic solution Place 1 drop into both eyes at bedtime.   Yes Historical Provider, MD  metoprolol (LOPRESSOR) 50 MG tablet Take 50 mg by mouth 2 (two) times daily.   Yes Historical Provider, MD  potassium chloride SA (K-DUR,KLOR-CON) 20 MEQ tablet Take 20 mEq by mouth daily. *To be taken when Bumex is taken for fluid retention*   Yes Historical Provider, MD   Allergies  Allergen Reactions  . Lisinopril    History reviewed. No pertinent family history. Social History  reports that she has never smoked. She does not have any smokeless tobacco history on file. She reports that she does not drink alcohol or use illicit drugs.  Review Of Systems   Cannot obtain due to slurred speech  Vital Signs:   Filed Vitals:   08/19/11 2230 08/19/11 2245 08/19/11 2300 08/19/11 2315  BP: 151/39 149/37 148/46 149/41  Pulse: 59   59  Temp:      TempSrc:      Resp: 22 21 20 22   Height:      Weight:  SpO2: 100% 99% 99% 100%    Physical Examination: Gen: chronically ill appearing HEENT: NCAT, PERRL,  Neck: supple without masses PULM: Insp crackles R base, prolonged expiration CV: RRR, no mgr, JVD elevated AB: BS+, soft, nontender, no hsm Ext: warm, trace edema, no clubbing, no cyanosis, scd's in place Derm: no rash or skin breakdown Neuro: Awake and alert, oriented to hospital, follows commands on R; L facial droop, does not move L hand, can wiggle L foot, bend leg at hip Psyche: Normal mood and affect  Labs and Imaging:   CBC    Component Value Date/Time   WBC 11.3* 08/19/2011 0444   RBC 3.74* 08/19/2011 0444   HGB 9.2* 08/19/2011 0444   HCT 28.5* 08/19/2011 0444   PLT 208  08/19/2011 0444   MCV 76.2* 08/19/2011 0444   MCH 24.6* 08/19/2011 0444   MCHC 32.3 08/19/2011 0444   RDW 16.9* 08/19/2011 0444   LYMPHSABS 0.7 08/18/2011 1658   MONOABS 0.7 08/18/2011 1658   EOSABS 0.0 08/18/2011 1658   BASOSABS 0.0 08/18/2011 1658    BMET    Component Value Date/Time   NA 141 08/19/2011 0444   K 3.1* 08/19/2011 0444   CL 105 08/19/2011 0444   CO2 24 08/19/2011 0444   GLUCOSE 313* 08/19/2011 0444   BUN 31* 08/19/2011 0444   CREATININE 1.30* 08/19/2011 0444   CALCIUM 8.2* 08/19/2011 0444   GFRNONAA 36* 08/19/2011 0444   GFRAA 42* 08/19/2011 0444    ABG No results found for this basename: phart, pco2, pco2art, po2, po2art, hco3, tco2, acidbasedef, o2sat   CXR: Cardiomegally, no pulm edema  Assessment and Plan: 78 y/o female with CAD, CHF, and asthma who had an acute ischaemic CVA on 8/15 while being treated for a UTI at [facility].  Etiology of stroke uncertain, underlying a-fib?  PCCM consulted for shortness of breath and increased work of breathing.  This responded well to albuterol and is likely due to her underlying asthma vs. COPD.  Stroke, acute, thrombotic (08/19/2011)   Assessment: ischaemic, s/p tPA; L sided weakness    Plan:  -per neurology  Asthma ()   Assessment: vs. COPD; per son, she receives scheduled nebulized bronchodilators at home   Plan:  -scheduled and prn duoneb  UTI (urinary tract infection) (08/18/2011)   Assessment:    Plan:  -ceftriaxone -f/u urine culture  Shortness of breath:   Assessment: likely due to asthma as responded to bronchodilators, JVD up but CXR normal and otherwise not volume up on exam; RLL crackles but no fevers, cough to suggest pneumonia   Plan: -monitor fluid status -repeat CXR if febrile, status changes -see asthma   Heber Valparaiso, M.D. Pulmonary and Critical Care Medicine [facility] Pager: [phone]  08/19/2011, 11:34 PM

## 2011-08-19 NOTE — Progress Notes (Signed)
Pt's son called RN in room, pt.'s face drooping on left side,pt.'s speech slurred, pt. Weak on left side, VSS, [id number], Dr. Sherrie Mustache paged.

## 2011-08-19 NOTE — H&P (Signed)
Admission H&P    Chief Complaint: Left sided weakness HPI: Cindy Abbott is an 78 y.o. female who was admitted to [facility] on 08/18/2011 with a UTI.  Today was noted by staff to be doing well until about 1730 when she was noted to have left facial droop, left sided weakness and slurred speech.  Family was with the patient and notified staff.   Patient soon became less alert as well.  Code stroke was called.  Initial exam is documented as " the patient was semi-alert and tried to follow commands as ordered. She had a visual field deficit with left visual field neglect, left facial droop, and flaccid paresis of her left arm and left leg. She was able to lift up her her right arm against gravity approximately 45 and was able to flex her right leg. She had noticeable dysarthria".  Head CT was unremarkable.  Teleneurology was consulted and patient was given tPA.  She is now transferred to [facility] for further management.    LSN: [id number] tPA Given: Yes  Past Medical History  Diagnosis Date  . Coronary artery disease   . Diabetes mellitus   . Pacemaker   . Asthma   . Stroke, acute, thrombotic 08/19/2011    Past Surgical History  Procedure Date  . Cardiac surgery   . Coronary angioplasty with stent placement   . Coronary artery bypass graft   . Abdominal hysterectomy   . Colon polpys     History reviewed. No pertinent family history. Social History:  reports that she has never smoked. She does not have any smokeless tobacco history on file. She reports that she does not drink alcohol or use illicit drugs.  Allergies:  Allergies  Allergen Reactions  . Lisinopril     Medications Prior to Admission  Medication Sig Dispense Refill  . albuterol (PROVENTIL) (5 MG/ML) 0.5% nebulizer solution Take 2.5 mg by nebulization every 6 (six) hours as needed.      Marland Kitchen aspirin EC 81 MG tablet Take 81 mg by mouth every morning.      . bumetanide (BUMEX) 1 MG tablet Take 1 mg by mouth daily. *Only take if fluid retention is  current. Provided via son discretion. Patient is to take Klor-Con with this medication*      . cloNIDine (CATAPRES - DOSED IN MG/24 HR) 0.2 mg/24hr patch Place 1 patch onto the skin once a week.      . insulin lispro (HUMALOG) 100 UNIT/ML injection Inject 5-10 Units into the skin 3 (three) times daily before meals. *Inject 5 units in the morning and 10 units at lunch and at dinner*      . isosorbide mononitrate (IMDUR) 30 MG 24 hr tablet Take 30 mg by mouth daily.      Marland Kitchen latanoprost (XALATAN) 0.005 % ophthalmic solution Place 1 drop into both eyes at bedtime.      . metoprolol (LOPRESSOR) 50 MG tablet Take 50 mg by mouth 2 (two) times daily.      . potassium chloride SA (K-DUR,KLOR-CON) 20 MEQ tablet Take 20 mEq by mouth daily. *To be taken when Bumex is taken for fluid retention*        ROS: History obtained from the patient  General ROS: negative for - chills, fatigue, fever, night sweats, weight gain or weight loss Psychological ROS: negative for - behavioral disorder, hallucinations, memory difficulties, mood swings or suicidal ideation Ophthalmic ROS: negative for - blurry vision, double vision, eye pain or loss of vision  ENT ROS: negative for - epistaxis, nasal discharge, oral lesions, sore throat, tinnitus or vertigo Allergy and Immunology ROS: negative for - hives or itchy/watery eyes Hematological and Lymphatic ROS: negative for - bleeding problems, bruising or swollen lymph nodes Endocrine ROS: negative for - galactorrhea, hair pattern changes, polydipsia/polyuria or temperature intolerance Respiratory ROS: shortness of breath  Cardiovascular ROS: negative for - chest pain, dyspnea on exertion, edema or irregular heartbeat Gastrointestinal ROS: negative for - abdominal pain, diarrhea, hematemesis, nausea/vomiting or stool incontinence Genito-Urinary ROS: negative for - dysuria, hematuria, incontinence or urinary frequency/urgency Musculoskeletal ROS: negative for - joint  swelling or muscular weakness Neurological ROS: as noted in HPI Dermatological ROS: negative for rash and skin lesion changes  Physical Examination: Blood pressure 171/45, pulse 59, temperature 99.6 F (37.6 C), temperature source Oral, resp. rate 23, height 5\' 2"  (1.575 m), weight 61 kg (134 lb 7.7 oz), SpO2 99.00%.  General Examination: HEENT-  Normocephalic, no lesions, without obvious abnormality.  Normal external eye and conjunctiva.  Normal TM's bilaterally.  Normal auditory canals and external ears. Normal external nose, mucus membranes and septum.  Normal pharynx. Neck supple with no masses, nodes, nodules or enlargement. Cardiovascular - S1, S2 normal Lungs - decreased breath sounds at the bases with wheezing noted.   Abdomen - soft, non-tender; bowel sounds normal; no masses,  no organomegaly Extremities - no edema  Neurologic Examination: Mental Status: Alert, oriented, thought content appropriate.  Speech fluent although decreased due to increased work of breathing.  Able to follow commands.  Left neglect. Cranial Nerves: II: Discs flat bilaterally;  Poor vision from the right eye.  Pupils equal, round, reactive to light and accommodation III,IV, VI: ptosis not present, extra-ocular motions intact bilaterally with right gaze preference V,VII: left facial droop, facial light touch sensation normal bilaterally VIII: hearing normal bilaterally IX,X: gag reflex decreased XI: trapezius strength decreased on the left XII: tongue strength normal  Motor: Able to lift RUE and RLE against gravity and uses spontaneously.  Unable to lift LUE or LLE off the bed.  Hand grip at 2-3/5.  Able to wiggle toes on the left. Sensory: Pinprick and light touch intact throughout, bilaterally Deep Tendon Reflexes: 1+ with absent AJ's bilaterally Plantars: Right: upgoing   Left: upgoing Cerebellar: Not performed  Laboratory Studies:   Basic Metabolic Panel:  Lab 08/19/11 4403 08/19/11 0444  08/18/11 1658  NA -- 141 144  K -- 3.1* 2.9*  CL -- 105 105  CO2 -- 24 28  GLUCOSE -- 313* 355*  BUN -- 31* 29*  CREATININE -- 1.30* 1.43*  CALCIUM -- 8.2* 8.8  MG 1.6 -- --  PHOS -- -- --    Liver Function Tests:  Lab 08/18/11 1658  AST 15  ALT 11  ALKPHOS 75  BILITOT 1.5*  PROT 6.2  ALBUMIN 2.4*   No results found for this basename: LIPASE:5,AMYLASE:5 in the last 168 hours No results found for this basename: AMMONIA:3 in the last 168 hours  CBC:  Lab 08/19/11 0444 08/18/11 1658  WBC 11.3* 10.6*  NEUTROABS -- 9.2*  HGB 9.2* 9.8*  HCT 28.5* 30.8*  MCV 76.2* 77.0*  PLT 208 207    Cardiac Enzymes:  Lab 08/18/11 1658  CKTOTAL --  CKMB --  CKMBINDEX --  TROPONINI <0.30    BNP: No components found with this basename: POCBNP:5  CBG:  Lab 08/19/11 2023 08/19/11 1731 08/19/11 1647 08/19/11 1130 08/19/11 0743  GLUCAP 211* 252* 245* 340* 258*  Microbiology: Results for orders placed during the hospital encounter of 08/18/11  URINE CULTURE     Status: Normal (Preliminary result)   Collection Time   08/18/11  5:33 PM      Component Value Range Status Comment   Specimen Description URINE, CATHETERIZED   Final    Special Requests NONE   Final    Culture  Setup Time 08/19/2011 00:30   Final    Colony Count >=100,000 COLONIES/ML   Final    Culture GRAM NEGATIVE RODS   Final    Report Status PENDING   Incomplete     Coagulation Studies:  [facility] 08/19/11 2016  LABPROT 14.5  INR 1.11    Urinalysis:  Lab 08/18/11 1732  COLORURINE YELLOW  LABSPEC 1.020  PHURINE 5.5  GLUCOSEU >1000*  HGBUR MODERATE*  BILIRUBINUR NEGATIVE  KETONESUR NEGATIVE  PROTEINUR 30*  UROBILINOGEN 0.2  NITRITE NEGATIVE  LEUKOCYTESUR SMALL*    Lipid Panel:  No results found for this basename: chol, trig, hdl, cholhdl, vldl, ldlcalc    HgbA1C:  Lab Results  Component Value Date   HGBA1C 7.7* 08/18/2011    Urine Drug Screen:   No results found for this basename:  labopia, cocainscrnur, labbenz, amphetmu, thcu, labbarb    Alcohol Level: No results found for this basename: ETH:2 in the last 168 hours  Imaging: Dg Chest 1 View  08/19/2011  *RADIOLOGY REPORT*  Clinical Data: Acute onset weakness and slurred speech.  CHEST - 1 VIEW  Comparison: Chest 08/18/2011.  Findings: The patient and is status post CABG.  Marked enlargement of the cardiopericardial silhouette is again seen.  Lungs are clear.  No pneumothorax or effusion.  IMPRESSION: Marked enlargement of the cardiopericardial silhouette.  No acute abnormality.  Original Report Authenticated By: Bernadene Bell. Maricela Curet, M.D.   Ct Head Wo Contrast  08/19/2011  *RADIOLOGY REPORT*  Clinical Data: Code stroke.  Agonal breathing.  Left-sided facial droop and left-sided weakness.  CT HEAD WITHOUT CONTRAST  Technique:  Contiguous axial images were obtained from the base of the skull through the vertex without contrast.  Comparison: None.  Findings: Slight motion degradation.  Overall diagnostic.  Moderate age related atrophy with chronic microvascular ischemic change.  No definite acute cortical infarction, hemorrhage, mass lesion, hydrocephalus, or extra-axial fluid.  Intact calvarium. Clear sinuses and mastoids. Moderate vascular calcification.  IMPRESSION: Age related changes with atrophy and chronic microvascular ischemic change.  No visible acute cortical infarct or intracranial hemorrhage.  Critical Value/emergent results were called by telephone at the time of interpretation on 08/19/2011 at 6:30 p.m. to Dr. Sherrie Mustache, who verbally acknowledged these results.  Original Report Authenticated By: Elsie Stain, M.D.   Dg Chest Portable 1 View  08/18/2011  *RADIOLOGY REPORT*  Clinical Data: Shortness of breath.  PORTABLE CHEST - 1 VIEW  Comparison: None.  Findings: Trachea is midline.  Heart is enlarged.  Pacemaker lead tip projects over the right ventricle.  Lungs are clear.  No pleural fluid.  IMPRESSION: No acute  findings.  Original Report Authenticated By: Reyes Ivan, M.D.    Assessment: 78 y.o. female with acute onset left sided weakness.  tPA administered.  Transferred here for further management.  CT shows no acute changes.  Patient with multiple stroke risk factors.  Initally with elevated BP.  Hydralazine given and BP now controlled.  Patient on ASA prior to admission.  Stroke Risk Factors - CAD, diabetes mellitus and stroke in the past  Plan: 1. HgbA1c, fasting lipid panel  2. PT consult, OT consult, Speech consult 3. Echocardiogram 4. Carotid dopplers 5. Prophylactic therapy-Hold all blood thinners at this time.  Plavix to be considered for long term prophylaxis.   7. Risk factor modification.  BP initially elevated. Will hold all antihypertensives at this time and only treat on a prn basis. 8. Telemetry monitoring 9. Frequent neuro checks 10.  Patient with shortness of breath even after albuterol.  Will have CCM evaluate.  Consulting MD notified.      Thana Farr, MD [facility] [phone] 08/19/2011, 11:19 PM

## 2011-08-19 NOTE — Progress Notes (Signed)
*  PRELIMINARY RESULTS* Echocardiogram 2D Echocardiogram has been performed.  Cindy Abbott 08/19/2011, 11:57 AM

## 2011-08-19 NOTE — Progress Notes (Signed)
Pt. Transferred to ICU via stretcher, report given to Laury Deep RN.

## 2011-08-19 NOTE — Progress Notes (Addendum)
At a little after 5:30 PM, registered nurse, Darel Hong paged me. She reported that the patient was eating, conversing with her family and the nursing staff when she began having a left-sided facial droop, slurred speech, and noticeable left-sided weakness. Her blood pressure was 133/71, heart rate 60-62 beats per minute, and oxygen saturation 99% on 2 L of oxygen. Her EKG revealed a ventricular paced rhythm with a heart rate of 61 beats per minute. CT scan of her head revealed no acute changes, but with age-related atrophy and chronic microvascular ischemic change. Upon my initial evaluation, the patient was semi-alert and try to follow commands as ordered. She had a visual field deficit with left visual field neglect, left facial droop, and flaccid paresis of her left arm and left leg. She was able to lift up her her right arm against gravity approximately 45 and was able to flex her right leg. She had noticeable dysarthria. Quite different from my exam this morning. Telemetry neurologist, Dr. Caron Presume, was consulted. The exam was performed again. Dr. Caron Presume discussed the pros and cons and the risks and benefits of starting t-PA with her son, [name]. He agreed with TPA treatment. The patient was transferred to the ICU. The patient was discussed with  Dr. Onalee Hua.      Total critical care time: 45 minutes.    Pt transferred [facility] under neuro service dr Thad Ranger.  Spoke to dr Thad Ranger.  Pt stable on tpa.  Son updated.  Full code.

## 2011-08-19 NOTE — Progress Notes (Signed)
Utilization Review Complete  

## 2011-08-19 NOTE — Progress Notes (Signed)
Dr. Sherrie Mustache in room, new orders received.

## 2011-08-19 NOTE — Progress Notes (Signed)
Pt. Back from CT and chest x-ray, via stretcher, family at side, Vital signs stable.

## 2011-08-19 NOTE — Evaluation (Signed)
Physical Therapy Evaluation Patient Details Name: Cindy Abbott Abbott MRN: [id number] DOB: 11-May-1922 Today's Date: 08/19/2011 Time: 0820-     PT Assessment / Plan / Recommendation Clinical Impression  Pt with improving strength and mobility who is not yet at prior functional level who will need skilled care to improve to prior functional level.    PT Assessment  Patient needs continued PT services    Follow Up Recommendations  Home health PT    Barriers to Discharge None      Equipment Recommendations  None recommended by PT    Recommendations for Other Services     Frequency Min 3X/week    Precautions / Restrictions Precautions Precautions: Fall Restrictions Weight Bearing Restrictions: No   Pertinent Vitals/Pain none      Mobility  Bed Mobility Bed Mobility: Sit to Supine Sit to Supine: 6: Modified independent (Device/Increase time) Transfers Transfers: Sit to Stand Sit to Stand: 5: Supervision Ambulation/Gait Ambulation/Gait Assistance: 5: Supervision Ambulation Distance (Feet): 15 Feet Assistive device: Rolling walker Ambulation/Gait Assistance Details: Pt ambulates slowly with RW; attempted ambulation without RW but pt was to unsteady Gait velocity: slow    Exercises     PT Diagnosis: Generalized weakness  PT Problem List: Decreased strength;Decreased activity tolerance PT Treatment Interventions: Gait training;Therapeutic exercise   PT Goals Acute Rehab PT Goals PT Goal Formulation: With patient Time For Goal Achievement: 08/24/11 Potential to Achieve Goals: Good Pt will go Sit to Stand: with modified independence PT Goal: Sit to Stand - Progress: Goal set today Pt will Transfer Bed to Chair/Chair to Bed: with modified independence PT Transfer Goal: Bed to Chair/Chair to Bed - Progress: Goal set today Pt will Ambulate: 16 - 50 feet PT Goal: Ambulate - Progress: Goal set today  Visit Information  Last PT Received On: 08/19/11    Subjective Data  Subjective: Cindy Abbott Abbott states she is in no pain.  Feels better than she was Patient Stated Goal: Pt wants to be able to walk again   Prior Functioning  Home Living Lives With: Son Available Help at Discharge: Family Type of Home: House Home Access: Stairs to enter Secretary/administrator of Steps: 3 Entrance Stairs-Rails: Right Home Layout: One level Bathroom Shower/Tub: Engineer, manufacturing systems: Standard Bathroom Accessibility: Yes Home Adaptive Equipment: None (has walker in the house but does not use normally) Additional Comments: Pt sone states that pt normally does not walk over approximately 15 yards due to breathing difficulties Prior Function Level of Independence: Independent with assistive device(s) Able to Take Stairs?: Yes (with son's assistance) Driving: No Vocation: Retired Musician: No difficulties Dominant Hand: Right    Cognition  Overall Cognitive Status: Appears within functional limits for tasks assessed/performed Arousal/Alertness: Awake/alert Orientation Level: Appears intact for tasks assessed Behavior During Session: [facility] for tasks performed    Extremity/Trunk Assessment Right Lower Extremity Assessment RLE ROM/Strength/Tone: Deficits RLE ROM/Strength/Tone Deficits: generally 3+/5 Left Lower Extremity Assessment LLE ROM/Strength/Tone: Deficits LLE ROM/Strength/Tone Deficits: generally 3/5   Balance Balance Balance Assessed: No  End of Session PT - End of Session Equipment Utilized During Treatment: Gait belt Activity Tolerance: Patient tolerated treatment well Patient left: in bed;with call bell/phone within reach  GP     Cindy Abbott Abbott,Cindy Abbott 08/19/2011, 9:43 AM

## 2011-08-19 NOTE — Progress Notes (Signed)
Subjective: The patient is lying in bed. She has no complaints of nausea, vomiting, or abdominal pain. Her son is in the room. He says that she is much more alert than she was yesterday.  Objective: Vital signs in last 24 hours: Filed Vitals:   08/19/11 0154 08/19/11 0440 08/19/11 0450 08/19/11 0745  BP:   122/62   Pulse:   59   Temp:   98.1 F (36.7 C)   TempSrc:   Oral   Resp:   18   Height:      Weight:  59.1 kg (130 lb 4.7 oz)    SpO2: 100%  99% 98%   No intake or output data in the 24 hours ending 08/19/11 0818  Weight change:   Physical exam: General: Patient is a pleasant elderly 78 year old African-American woman laying in bed, in no acute distress. Lungs: Decreased breath sounds at the bases, otherwise clear. Heart: S1, S2, with a soft systolic murmur and borderline bradycardia. Abdomen: Positive bowel sounds, soft, nontender, nondistended. Extremities: No pedal edema. Neurologic: She is alert. She is oriented to herself, her son, and hospital. She is not oriented to time, date, or month. She is pleasant. Cranial nerves II through XII are grossly intact.   Lab Results: Basic Metabolic Panel:  Basename 08/19/11 0444 08/18/11 1658  NA 141 144  K 3.1* 2.9*  CL 105 105  CO2 24 28  GLUCOSE 313* 355*  BUN 31* 29*  CREATININE 1.30* 1.43*  CALCIUM 8.2* 8.8  MG -- --  PHOS -- --   Liver Function Tests:  Basename 08/18/11 1658  AST 15  ALT 11  ALKPHOS 75  BILITOT 1.5*  PROT 6.2  ALBUMIN 2.4*   No results found for this basename: LIPASE:2,AMYLASE:2 in the last 72 hours No results found for this basename: AMMONIA:2 in the last 72 hours CBC:  Basename 08/19/11 0444 08/18/11 1658  WBC 11.3* 10.6*  NEUTROABS -- 9.2*  HGB 9.2* 9.8*  HCT 28.5* 30.8*  MCV 76.2* 77.0*  PLT 208 207   Cardiac Enzymes:  Basename 08/18/11 1658  CKTOTAL --  CKMB --  CKMBINDEX --  TROPONINI <0.30   BNP:  Basename 08/18/11 1658  PROBNP 7537.0*   D-Dimer: No results  found for this basename: DDIMER:2 in the last 72 hours CBG:  Basename 08/19/11 0743 08/18/11 2153  GLUCAP 258* 312*   Hemoglobin A1C: No results found for this basename: HGBA1C in the last 72 hours Fasting Lipid Panel: No results found for this basename: CHOL,HDL,LDLCALC,TRIG,CHOLHDL,LDLDIRECT in the last 72 hours Thyroid Function Tests: No results found for this basename: TSH,T4TOTAL,FREET4,T3FREE,THYROIDAB in the last 72 hours Anemia Panel:  Basename 08/18/11 2130  VITAMINB12 --  FOLATE --  FERRITIN --  TIBC --  IRON --  RETICCTPCT 1.0   Coagulation: No results found for this basename: LABPROT:2,INR:2 in the last 72 hours Urine Drug Screen: Drugs of Abuse  No results found for this basename: labopia, cocainscrnur, labbenz, amphetmu, thcu, labbarb    Alcohol Level: No results found for this basename: ETH:2 in the last 72 hours Urinalysis:  Basename 08/18/11 1732  COLORURINE YELLOW  LABSPEC 1.020  PHURINE 5.5  GLUCOSEU >1000*  HGBUR MODERATE*  BILIRUBINUR NEGATIVE  KETONESUR NEGATIVE  PROTEINUR 30*  UROBILINOGEN 0.2  NITRITE NEGATIVE  LEUKOCYTESUR SMALL*   Misc. Labs:   Micro: No results found for this or any previous visit (from the past 240 hour(s)).  Studies/Results: Dg Chest Portable 1 View  08/18/2011  *RADIOLOGY REPORT*  Clinical  Data: Shortness of breath.  PORTABLE CHEST - 1 VIEW  Comparison: None.  Findings: Trachea is midline.  Heart is enlarged.  Pacemaker lead tip projects over the right ventricle.  Lungs are clear.  No pleural fluid.  IMPRESSION: No acute findings.  Original Report Authenticated By: Reyes Ivan, M.D.    Medications:  Scheduled:   . albuterol  2.5 mg Nebulization BID  . aspirin EC  81 mg Oral Daily  . cefTRIAXone (ROCEPHIN) IVPB 1 gram/50 mL D5W  1 g Intravenous Once  . cefTRIAXone (ROCEPHIN)  IV  1 g Intravenous Q24H  . cloNIDine  0.2 mg Transdermal Weekly  . enoxaparin (LOVENOX) injection  30 mg Subcutaneous Q24H  .  insulin aspart  0-15 Units Subcutaneous TID WC  . insulin aspart  0-5 Units Subcutaneous QHS  . insulin glargine  5 Units Subcutaneous QHS  . isosorbide mononitrate  30 mg Oral Daily  . latanoprost  1 drop Both Eyes QHS  . metoprolol  25 mg Oral BID  . potassium chloride SA  20 mEq Oral BID  . sodium chloride  3 mL Intravenous Q12H  . sodium chloride  3 mL Intravenous Q12H  . DISCONTD: enoxaparin (LOVENOX) injection  40 mg Subcutaneous Q24H  . DISCONTD: insulin aspart  0-9 Units Subcutaneous TID WC  . DISCONTD: metoprolol  50 mg Oral BID   Continuous:   . 0.9 % NaCl with KCl 40 mEq / L     ZOX:WRUEAV chloride, albuterol, ondansetron (ZOFRAN) IV, ondansetron, sodium chloride, DISCONTD: albuterol  Assessment: Principal Problem:  *Generalized weakness Active Problems:  Coronary artery disease  Asthma  UTI (urinary tract infection)  Confusion  Renal insufficiency, mild  Anemia  Hypokalemia  Diabetes mellitus  S/P CABG x 3  CHF, chronic  S/P placement of cardiac pacemaker  S/P colonoscopy with polypectomy  H/O angioedema    1. Urinary tract infection. We'll continue Rocephin. We'll check the results of the urine culture when available.  Generalized weakness and confusion/encephalopathy. Secondary to #1. Resolving.  Acute renal insufficiency. Her baseline creatinine is unknown. She may have underlying chronic kidney disease.  Type 2 diabetes mellitus. She is on sliding scale NovoLog currently. Her capillary blood glucose is not under optimal control currently. A1c is pending.  Hypokalemia. Possibly secondary to history of diuretic therapy. Magnesium deficiency will need to be ruled out.  Microscopic anemia. Anemia panel is pending.  Coronary artery disease/chronic congestive heart failure. Her ejection fraction is unknown. 2-D echocardiogram is pending.   Plan:  1. Continue antibiotic therapy. 2. Will start gentle IV fluids although her pro BNP is elevated and she  has a history of chronic congestive heart failure. I believe gentle IV fluids we'll augment healing of a urinary tract infection. 3. We'll check the results of the 2-D echocardiogram pending. We'll check the results of the urine culture pending. 4. We'll continue to replete potassium chloride in the IV fluids and orally. We'll check a magnesium level to rule out deficiency. 5. We'll check the results of the anemia panel pending. 6. Will increase the sliding scale NovoLog to the moderate scale. We'll add bedtime Lantus. We'll check the results of the hemoglobin A1c pending. 7. We'll order TSH.     LOS: 1 day   Cindy Abbott 08/19/2011, 8:18 AM

## 2011-08-19 NOTE — Progress Notes (Signed)
Consult with Neurologist Dr. Caron Presume done via link, new orders received.

## 2011-08-20 ENCOUNTER — Inpatient Hospital Stay (HOSPITAL_COMMUNITY): Payer: Medicare Other

## 2011-08-20 DIAGNOSIS — I6529 Occlusion and stenosis of unspecified carotid artery: Secondary | ICD-10-CM

## 2011-08-20 DIAGNOSIS — I6350 Cerebral infarction due to unspecified occlusion or stenosis of unspecified cerebral artery: Secondary | ICD-10-CM

## 2011-08-20 DIAGNOSIS — I69921 Dysphasia following unspecified cerebrovascular disease: Secondary | ICD-10-CM

## 2011-08-20 DIAGNOSIS — G8110 Spastic hemiplegia affecting unspecified side: Secondary | ICD-10-CM

## 2011-08-20 LAB — GLUCOSE, CAPILLARY
Glucose-Capillary: 202 mg/dL — ABNORMAL HIGH (ref 70–99)
Glucose-Capillary: 216 mg/dL — ABNORMAL HIGH (ref 70–99)

## 2011-08-20 LAB — LIPID PANEL
Cholesterol: 121 mg/dL (ref 0–200)
HDL: 27 mg/dL — ABNORMAL LOW (ref >39)
Total CHOL/HDL Ratio: 4.5 RATIO
Triglycerides: 118 mg/dL (ref <150)
VLDL: 24 mg/dL (ref 0–40)

## 2011-08-20 LAB — URINE CULTURE

## 2011-08-20 LAB — HEMOGLOBIN A1C: Mean Plasma Glucose: 189 mg/dL — ABNORMAL HIGH (ref <117)

## 2011-08-20 MED ORDER — RESOURCE THICKENUP CLEAR PO POWD
ORAL | Status: DC | PRN
Start: 2011-08-20 — End: 2011-08-23
  Filled 2011-08-20: qty 125

## 2011-08-20 MED ORDER — ASPIRIN 300 MG RE SUPP
300.0000 mg | Freq: Every day | RECTAL | Status: DC
  Administered 2011-08-20: 300 mg via RECTAL
  Filled 2011-08-20 (×2): qty 1

## 2011-08-20 MED ORDER — IOHEXOL 350 MG/ML SOLN
50.0000 mL | Freq: Once | INTRAVENOUS | Status: AC | PRN
  Administered 2011-08-20: 50 mL via INTRAVENOUS

## 2011-08-20 MED ORDER — BIOTENE DRY MOUTH MT LIQD
15.0000 mL | Freq: Four times a day (QID) | OROMUCOSAL | Status: DC
  Administered 2011-08-20 – 2011-08-22 (×7): 15 mL via OROMUCOSAL

## 2011-08-20 NOTE — Progress Notes (Signed)
I met with patient, son and his wife at bedside. Family can provide 24/7 care at d/c and prefer CIR. I will follow up Monday to prepare pt for admit when medically ready. Please call for any questions. [phone]

## 2011-08-20 NOTE — Progress Notes (Signed)
Stroke Team Progress Note  HISTORY Cindy Abbott is an 78 y.o. female who was admitted to [facility] on 08/18/2011 with a UTI. Today 08/19/2011 was noted by staff to be doing well until about 1730 when she was noted to have left facial droop, left sided weakness and slurred speech. Family was with the patient and notified staff. Patient soon became less alert as well. Code stroke was called. Initial exam is documented as " the patient was semi-alert and tried to follow commands as ordered. She had a visual field deficit with left visual field neglect, left facial droop, and flaccid paresis of her left arm and left leg. She was able to lift up her her right arm against gravity approximately 45 and was able to flex her right leg. She had noticeable dysarthria". Head CT was unremarkable. Teleneurology was consulted and patient was given tPA. She is now transferred to [facility] for further management.   SUBJECTIVE No family  is at the bedside.  She has persistent left hemiplegia and dysarthria.  OBJECTIVE Most recent Vital Signs: Filed Vitals:   08/20/11 0730 08/20/11 0800 08/20/11 0808 08/20/11 0900  BP: 143/36 141/42  148/39  Pulse: 60 60  60  Temp:   98.3 F (36.8 C)   TempSrc:   Oral   Resp: 14 15  15   Height:      Weight:      SpO2: 100% 100% 100% 100%   CBG (last 3)   Basename 08/20/11 0923 08/19/11 2023 08/19/11 1731  GLUCAP 202* 211* 252*   Intake/Output from previous day: 08/15 0701 - 08/16 0700 In: 1032.5 [I.V.:980.5; IV Piggyback:52] Out: -   IV Fluid Intake:     . sodium chloride 50 mL/hr at 08/20/11 0900  . 0.9 % NaCl with KCl 40 mEq / L 50 mL/hr at 08/19/11 2038    MEDICATIONS    . ipratropium  0.5 mg Nebulization Q6H   And  . albuterol  2.5 mg Nebulization Q6H  . alteplase  0.9 mg/kg Intravenous Once  . antiseptic oral rinse  15 mL Mouth Rinse QID  . cefTRIAXone (ROCEPHIN)  IV  1 g Intravenous Q24H  . cloNIDine  0.2 mg Transdermal Weekly  . hydrALAZINE      . hydrALAZINE  10 mg  Intravenous Once  . insulin aspart  0-15 Units Subcutaneous TID WC  . insulin aspart  0-5 Units Subcutaneous QHS  . insulin glargine  5 Units Subcutaneous QHS  . isosorbide mononitrate  30 mg Oral Daily  . latanoprost  1 drop Both Eyes QHS  . metoprolol  25 mg Oral BID  . pantoprazole (PROTONIX) IV  40 mg Intravenous QHS  . potassium chloride SA  20 mEq Oral BID  . sodium chloride  3 mL Intravenous Q12H  . DISCONTD: albuterol  2.5 mg Nebulization BID  . DISCONTD: aspirin EC  81 mg Oral Daily  . DISCONTD: enoxaparin (LOVENOX) injection  30 mg Subcutaneous Q24H  . DISCONTD: sodium chloride  3 mL Intravenous Q12H   PRN:  sodium chloride, acetaminophen, acetaminophen, albuterol, ipratropium, ondansetron (ZOFRAN) IV, ondansetron, senna-docusate, sodium chloride, DISCONTD: albuterol  Diet:  NPO  liquids Activity:  Bedrest DVT Prophylaxis:  SCDs   CLINICALLY SIGNIFICANT STUDIES Basic Metabolic Panel:  Lab 08/20/11 1478 08/19/11 0917 08/19/11 0444 08/18/11 1658  NA -- -- 141 144  K 4.1 -- 3.1* --  CL -- -- 105 105  CO2 -- -- 24 28  GLUCOSE -- -- 313* 355*  BUN -- --  31* 29*  CREATININE -- -- 1.30* 1.43*  CALCIUM -- -- 8.2* 8.8  MG -- 1.6 -- --  PHOS -- -- -- --   Liver Function Tests:  Lab 08/18/11 1658  AST 15  ALT 11  ALKPHOS 75  BILITOT 1.5*  PROT 6.2  ALBUMIN 2.4*   CBC:  Lab 08/19/11 0444 08/18/11 1658  WBC 11.3* 10.6*  NEUTROABS -- 9.2*  HGB 9.2* 9.8*  HCT 28.5* 30.8*  MCV 76.2* 77.0*  PLT 208 207   Coagulation:  Lab 08/19/11 2016  LABPROT 14.5  INR 1.11   Cardiac Enzymes:  Lab 08/18/11 1658  CKTOTAL --  CKMB --  CKMBINDEX --  TROPONINI <0.30   Urinalysis:  Lab 08/18/11 1732  COLORURINE YELLOW  LABSPEC 1.020  PHURINE 5.5  GLUCOSEU >1000*  HGBUR MODERATE*  BILIRUBINUR NEGATIVE  KETONESUR NEGATIVE  PROTEINUR 30*  UROBILINOGEN 0.2  NITRITE NEGATIVE  LEUKOCYTESUR SMALL*   Urine Culture > 100K GNR, final report pending  Lipid Panel      Component Value Date/Time   CHOL 121 08/20/2011 0407   TRIG 118 08/20/2011 0407   HDL 27* 08/20/2011 0407   CHOLHDL 4.5 08/20/2011 0407   VLDL 24 08/20/2011 0407   LDLCALC 70 08/20/2011 0407   HgbA1C  Lab Results  Component Value Date   HGBA1C 8.2* 08/20/2011   Urine Drug Screen:   No results found for this basename: labopia, cocainscrnur, labbenz, amphetmu, thcu, labbarb    Alcohol Level: No results found for this basename: ETH:2 in the last 168 hours  CT of the brain  08/19/2011  Age related changes with atrophy and chronic microvascular ischemic change.  No visible acute cortical infarct or intracranial hemorrhage.    MRI of the brain  pacer  MRA of the brain  pacer  2D Echocardiogram  08/19/2011 EF 55-60% with no source of embolus. Probable hypokinesis of the apical myocardium. Mitral valve: Mild to at worst moderate regurgitation.  Carotid Doppler    CXR   08/19/2011  Marked enlargement of the cardiopericardial silhouette.  No acute abnormality.  08/19/2011  Cardiomegaly without failure.  No acute abnormality or interval change. 08/18/2011  : No acute findings.   EKG  Paced rhythm.   Therapy Recommendations PT - ; OT - ; ST -   Physical Exam   Elderly african american lady not in distress.Awake alert. Afebrile. Head is nontraumatic. Neck is supple without bruit. Hearing is normal. Cardiac exam no murmur or gallop. Lungs are clear to auscultation. Distal pulses are well felt.  Neurological Exam ; Awake alert oriented x 2. severe dysarthria. No aphasia. Right gaze preference but can look to left.does not blink to threat on the left but does so on the right. CV her right lower facial weakness. Tongue deviates to the left. Dense left hemiplegia with grade 1-2 strength. Diminished sensation on the left. Mild left hemi-neglect. Left plantar is upgoing. Right plantar is downgoing. NIH stroke scale score 19. ASSESSMENT [name] is a 78 y.o. female presenting with acute left facial  droop, left sided weakness and slurred speech while an IP at [facility] for UTI. Specialists on Call were consulted and patient was administered tPA, 08/19/2011 at 1933 per RN report. Intervention was not considered. Suspected embolic infarct, workup underway. On aspirin 81 mg orally every day prior to admission. aspirin 325 mg orally every day ordered daily on admission. Patient with resultant left hemiparesis.  -diabetes, uncontrolleld, HgbA1c 8.2 -LDL 70 -CAD -Pacemaker -Asthma -UTI, >  100k GNR, final report pending, on Rocephin IV D#2 -anemia, Hgb 9.2 -leukocytosis, Wbc 11.2  Hospital day # 2  TREATMENT/PLAN -CT angio of the head and neck as patient cannot have MRI/A secondary to pacemaker -Resume aspirin 325 mg orally every day for secondary stroke prevention after 1733 if imaging negative for hemorrhage. 1st dose to be given prior to 12 midnight tonight -OOB. Therapy evals. Rehab consult. -get Specialist on Call records/consult note from [facility] -strict blood pressure control. Followup imaging per TPA protocol This patient is critically ill and at significant risk of neurological worsening, death and care requires constant monitoring of vital signs, hemodynamics,respiratory and cardiac monitoring,review of multiple databases, neurological assessment, discussion with family, other specialists and medical decision making of high complexity. I spent 30 minutes of neurocritical care time  in the care of  this patient.   Annie Main, MSN, RN, ANVP-BC, ANP-BC, [facility] Pager: [phone] 08/20/2011 12:36 PM  Scribe for Dr. Delia Heady, Stroke Center Medical Director, who has personally reviewed chart, pertinent data, examined the patient and developed the plan of care. Pager:  [phone]

## 2011-08-20 NOTE — Progress Notes (Signed)
*  PRELIMINARY RESULTS* Vascular Ultrasound Carotid Doppler has been completed.  Preliminary findings: Bilaterally 40-59% ICA stenosis with antegrade vertebral flow. Very technically difficult.  Farrel Demark, RDMS, RVT  08/20/2011, 10:14 AM

## 2011-08-20 NOTE — Evaluation (Signed)
Occupational Therapy Evaluation Patient Details Name: Cindy Abbott MRN: [id number] DOB: [birth year] Today's Date: 08/20/2011 Time: 4098-1191 OT Time Calculation (min): 30 min  OT Assessment / Plan / Recommendation Clinical Impression  78 yo female admitted to [facility] with severe UTI and code stroke called. Pt given TPA and transfered to [facility]. Pt with Lt side hemplegia that could benefit from skilled OT acutely.    OT Assessment  Patient needs continued OT Services    Follow Up Recommendations  Inpatient Rehab    Barriers to Discharge Other (comment) (TBA- lives with son)    Equipment Recommendations  Defer to next venue    Recommendations for Other Services Rehab consult  Frequency  Min 2X/week    Precautions / Restrictions Precautions Precautions: Fall Restrictions Weight Bearing Restrictions: No   Pertinent Vitals/Pain none    ADL  Grooming: Simulated;Wash/dry hands;Wash/dry face;Moderate assistance Where Assessed - Grooming: Supported sitting Upper Body Dressing: Performed;Maximal assistance Where Assessed - Upper Body Dressing: Supported sitting Toilet Transfer: Simulated;+2 Total assistance Toilet Transfer: Patient Percentage: 20% Statistician Method: Surveyor, minerals: Raised toilet seat with arms (or 3-in-1 over toilet) Equipment Used: Gait belt Transfers/Ambulation Related to ADLs: pt unable to perform at this time ADL Comments: Pt demonstrates Lt neglect. Pt pleasant and agreeable to treatment. Pt reports no deficits or changes since admission. Pt oriented to self and place (hospital) pt unable to state city or name of hospital. Pt states "i think its a hospital" Pt unable to state reason for admission  pt reports "my daughter thought I was sick". Pt with strong Lt lateral lean. Pt weight bearing on Lt UE and returned to EOB sitting support with increased truck activation. Pt attending to Lt side with mod v/c or tracking an object. Pt with Lt  facial droop present. Pt with poor awareness of deficits or need for assistance. Pt positioned in chair with Rn (A)ing with thicken liquids    OT Diagnosis: Generalized weakness;Cognitive deficits;Hemiplegia non-dominant side  OT Problem List: Decreased strength;Decreased activity tolerance;Impaired balance (sitting and/or standing);Decreased coordination;Decreased cognition;Decreased safety awareness;Decreased knowledge of use of DME or AE;Decreased knowledge of precautions;Cardiopulmonary status limiting activity;Impaired UE functional use;Impaired sensation OT Treatment Interventions: Self-care/ADL training;Therapeutic exercise;Neuromuscular education;DME and/or AE instruction;Therapeutic activities;Cognitive remediation/compensation;Visual/perceptual remediation/compensation;Patient/family education;Balance training   OT Goals Acute Rehab OT Goals OT Goal Formulation: With patient Time For Goal Achievement: 09/03/11 Potential to Achieve Goals: Good ADL Goals Pt Will Perform Grooming: with min assist;Sitting, chair;Supported;with cueing (comment type and amount) (verbal cue) ADL Goal: Grooming - Progress: Goal set today Pt Will Perform Upper Body Bathing: with min assist;Sitting, chair;Supported;with cueing (comment type and amount) (verbal) ADL Goal: Upper Body Bathing - Progress: Goal set today Pt Will Perform Upper Body Dressing: with min assist;Supported;Sitting, chair ADL Goal: Upper Body Dressing - Progress: Goal set today Pt Will Transfer to Toilet: with max assist;3-in-1;Stand pivot transfer ADL Goal: Toilet Transfer - Progress: Goal set today Miscellaneous OT Goals Miscellaneous OT Goal #1: Pt will tolerate weight bearing on Lt Ue for 2 minutes as precursor to EOB sitting  OT Goal: Miscellaneous Goal #1 - Progress: Goal set today  Visit Information  Last OT Received On: 08/20/11 Assistance Needed: +2 PT/OT Co-Evaluation/Treatment: Yes    Subjective Data  Subjective: "I can't  have too much blanket on me so I can walk" Patient Stated Goal: pt with cognitive deficits and difficult to fully assess   Prior Functioning  Vision/Perception  Home Living Lives With: Son Available Help at Discharge:  Family Type of Home: House Home Access: Stairs to enter [employer] of Steps: 3 Entrance Stairs-Rails: Right Home Layout: One level Bathroom Shower/Tub: Engineer, manufacturing systems: Standard Bathroom Accessibility: Yes Home Adaptive Equipment: Walker - rolling;Other (comment) (has RW but does not use it normally) Additional Comments: ALL information obtained from PT evaluation on [id number] at [facility]- Per chart review son reports pt normally only walks ~15 yds due to COPD Prior Function Level of Independence: Independent with assistive device(s) Able to Take Stairs?: Yes Driving: No Vocation: Retired Musician: No difficulties Dominant Hand: Right   Vision - Assessment Eye Alignment: Within Functional Limits Vision Assessment: Vision tested Ocular Range of Motion: Within Functional Limits Tracking/Visual Pursuits: Decreased smoothness of vertical tracking;Requires cues, head turns, or add eye shifts to track Saccades: Undershoots Convergence: Impaired (comment)  Cognition  Overall Cognitive Status: Impaired Area of Impairment: Memory;Following commands;Safety/judgement;Awareness of errors;Problem solving Arousal/Alertness: Awake/alert Orientation Level: Disoriented to;Place;Situation;Time Behavior During Session: [facility] for tasks performed Memory Deficits: pt unable to recall number of children. Pt reports none when asked. Pt reports living with sister however chart states lives with son. Pt reports daughter was the reason for admission Following Commands: Follows one step commands inconsistently Safety/Judgement: Decreased awareness of safety precautions;Decreased safety judgement for tasks assessed Awareness of Errors:  Assistance required to identify errors made Problem Solving: Pt asked "should you try to walk by yourself" pt said "i am not sure about that" which was a good response. However pt requesting only one blanket so that "its not too heavy so I walk"    Extremity/Trunk Assessment Right Upper Extremity Assessment RUE ROM/Strength/Tone: Within functional levels RUE Sensation: WFL - Light Touch RUE Coordination: WFL - gross/fine motor Left Upper Extremity Assessment LUE ROM/Strength/Tone: Deficits LUE ROM/Strength/Tone Deficits: hand grasp 2 out 5 (gross grasp), bicep 2- out 5, tricep 3+ out 5, shoulder flexion 2- out 5 LUE Sensation: Deficits LUE Sensation Deficits: poor with sensation testing, slight subluxation at joint LUE Coordination: Deficits Trunk Assessment Trunk Assessment: Kyphotic   Mobility Bed Mobility Bed Mobility: Rolling Left;Sitting - Scoot to Edge of Bed;Supine to Sit Rolling Left: 2: Max assist Supine to Sit: 1: +2 Total assist;HOB elevated Supine to Sit: Patient Percentage: 10% Sitting - Scoot to Edge of Bed: 1: +2 Total assist Sitting - Scoot to Edge of Bed: Patient Percentage: 10% Details for Bed Mobility Assistance: Pt required (A) to sequence task. Pt log rolled to Lt side to provide weight bearing prior to EOB. Pt with Lateral / posterior LT LOB lean Transfers Transfers: Sit to Stand;Stand to Sit Sit to Stand: With upper extremity assist;1: +2 Total assist;From bed Sit to Stand: Patient Percentage: 30% Stand to Sit: 1: +2 Total assist;To chair/3-in-1;With armrests Stand to Sit: Patient Percentage: 30% Details for Transfer Assistance: pt with bil le buckle and tried blocking. Pt with Lt lean with static standing.    Exercise    Balance Balance Balance Assessed: Yes Static Sitting Balance Static Sitting - Balance Support: Right upper extremity supported;Feet supported Static Sitting - Level of Assistance: 2: Max assist (DEMONSTRATES PUSHERS)  End of Session OT -  End of Session Equipment Utilized During Treatment: Gait belt Activity Tolerance: Patient tolerated treatment well Patient left: in chair;with call bell/phone within reach Nurse Communication: Mobility status  GO     Lucile Shutters 08/20/2011, 11:49 AM Pager: [phone]

## 2011-08-20 NOTE — Progress Notes (Signed)
Inpatient Diabetes Program Recommendations  AACE/ADA: New Consensus Statement on Inpatient Glycemic Control  Target Ranges:  Prepandial:   less than 140 mg/dL      Peak postprandial:   less than 180 mg/dL (1-2 hours)      Critically ill patients:  140 - 180 mg/dL  Pager:  [phone] Hours:  8 am-10pm   Reason for Visit: Elevated glucose:  Results for ANNET, MANUKYAN (MRN [id number]) as of 08/20/2011 11:20  Ref. Range 08/19/2011 11:30 08/19/2011 16:47 08/19/2011 17:31 08/19/2011 20:23 08/20/2011 09:23  Glucose-Capillary Latest Range: 70-99 mg/dL 952 (H) 841 (H) 324 (H) 211 (H) 202 (H)   Inpatient Diabetes Program Recommendations  Insulin - Basal: Increase Lantus to 15 units qhs or begin ICU Hyperglycemia protocol  Alfredia Client PhD, RN, BC-ADM Diabetes Coordinator  Office:  [phone] Team Pager:  [phone]

## 2011-08-20 NOTE — Progress Notes (Signed)
OT Cancellation Note  Treatment cancelled today due to medical issues with patient which prohibited therapy (strict bedrest order set). OT to await increased activity orders.  Harrel Carina [place] Pager: [phone]  08/20/2011, 7:26 AM

## 2011-08-20 NOTE — Progress Notes (Signed)
PT Cancellation Note  Treatment cancelled today due to pt currently on Strict Bedrest.  Please advance activity order to allow for therapy evals.  Thanks.    Karma Ansley, Alison Murray 08/20/2011, 7:39 AM

## 2011-08-20 NOTE — Evaluation (Signed)
Clinical/Bedside Swallow Evaluation Patient Details  Name: Cindy Abbott MRN: [id number] Date of Birth: [birth year]  Today's Date: 08/20/2011 Time: 1015-1045 SLP Time Calculation (min): 30 min  Past Medical History:  Past Medical History  Diagnosis Date  . Coronary artery disease   . Diabetes mellitus   . Pacemaker   . Asthma   . Stroke, acute, thrombotic 08/19/2011   Past Surgical History:  Past Surgical History  Procedure Date  . Cardiac surgery   . Coronary angioplasty with stent placement   . Coronary artery bypass graft   . Abdominal hysterectomy   . Colon polpys    HPI:  78 y/o female admitted to [facility] on 08/18/11 with UTI. Patient developed stroke like symptoms and transeferred to [facility] for further mangagement.  Patient received tPA.  CT unremarkable.  CXR completed 08/19/11 indicates cardiomegaly w/o failure with no acute abnormality or intervel change.  Patient failed RN Stroke Swallow Screen,   Patient referred for BSE per stroke protocol.     Assessment / Plan / Recommendation Clinical Impression  Dysphagia indicated mainly due to decreased sensation and weaknes.  +s/s of aspiration noted s/p swallow of trial ice chips and thin water by spoon and cup due to marked delay in initiation.  Solid Po trials not attempted secondary to patient's dentures not available and  due to oral  dysphagia .  Recommend to initiate conservative diet of dysphagia 1 (puree) and nectar thick liquids with full supervision with all  meals as aspiration risk remains.  ST to follow for diet tolerance and possible diet advancement.      Aspiration Risk  Moderate    Diet Recommendation Dysphagia 1 (Puree);Nectar-thick liquid   Liquid Administration via: Cup;No straw Medication Administration: Crushed with puree Supervision: Full supervision/cueing for compensatory strategies Compensations: Small sips/bites;Slow rate;Multiple dry swallows after each bite/sip;Clear throat intermittently Postural Changes and/or  Swallow Maneuvers: Seated upright 90 degrees;Upright 30-60 min after meal    Other  Recommendations Oral Care Recommendations: Oral care before and after PO Other Recommendations: Order thickener from pharmacy;Clarify dietary restrictions;Prohibited food (jello, ice cream, thin soups);Remove water pitcher   Follow Up Recommendations  Home health SLP    Frequency and Duration min 2x/week  2 weeks       SLP Swallow Goals Patient will consume recommended diet without observed clinical signs of aspiration with: Minimal assistance Patient will utilize recommended strategies during swallow to increase swallowing safety with: Minimal assistance   Swallow Study Prior Functional Status   Lived at home with her son with no prior reports of dysphagia     General Date of Onset: 08/19/11 HPI: 78 y/o female admitted to [facility] on 08/18/11 with UTI. Patient developed stroke like symptoms and transeferred to [facility] for further mangagement.  Patient received tPA.  CT unremarkable.  CXR completed 08/19/11 indicates cariomegaly w/o failure with no acute abnormality or intervel change.  Patient failed RN Stroke Swallow Screen,   Patient referred for BSE per stroke protocol.   Type of Study: Bedside swallow evaluation Previous Swallow Assessment: No prior reports of dysphagia  Diet Prior to this Study: NPO Temperature Spikes Noted: No Respiratory Status: Room air History of Recent Intubation: No Behavior/Cognition: Alert;Cooperative;Pleasant mood;Confused;Requires cueing Oral Cavity - Dentition: Dentures, not available;Edentulous Self-Feeding Abilities: Total assist Patient Positioning: Upright in bed Baseline Vocal Quality: Hoarse Volitional Cough: Strong Volitional Swallow: Able to elicit    Oral/Motor/Sensory Function Overall Oral Motor/Sensory Function: Impaired Labial ROM: Reduced left Labial Symmetry: Abnormal symmetry left Labial Strength: Reduced  Labial Sensation: Reduced Lingual ROM: Reduced  left Lingual Symmetry: Abnormal symmetry left Lingual Strength: Reduced Lingual Sensation: Reduced Facial ROM: Reduced left Facial Symmetry: Left droop Facial Strength: Reduced Facial Sensation: Reduced Velum: Within Functional Limits Mandible: Within Functional Limits   Ice Chips Ice chips: Impaired Presentation: Spoon Oral Phase Functional Implications: Left anterior spillage;Prolonged oral transit Pharyngeal Phase Impairments: Throat Clearing - Delayed;Suspected delayed Swallow   Thin Liquid Thin Liquid: Impaired Presentation: Cup;Spoon Oral Phase Impairments: Reduced labial seal Oral Phase Functional Implications: Left anterior spillage Pharyngeal  Phase Impairments: Suspected delayed Swallow;Throat Clearing - Delayed;Multiple swallows;Wet Vocal Quality    Nectar Thick Nectar Thick Liquid: Impaired Presentation: Cup;Spoon Pharyngeal Phase Impairments: Suspected delayed Swallow   Honey Thick Honey Thick Liquid: Not tested   Puree Puree: Impaired Oral Phase Impairments: Reduced lingual movement/coordination Oral Phase Functional Implications: Left anterior spillage;Prolonged oral transit Pharyngeal Phase Impairments: Suspected delayed Swallow   Solid   Moreen Fowler MS, CCC-SLP [phone]    Solid: Not tested       [facility] 08/20/2011,11:26 AM

## 2011-08-20 NOTE — Evaluation (Signed)
Speech Language Pathology Evaluation Patient Details Name: Cindy Abbott MRN: [id number] DOB: 10-21-1922 Today's Date: 08/20/2011 Time: 1045-1100 SLP Time Calculation (min): 15 min  Problem List:  Patient Active Problem List  Diagnosis  . Coronary artery disease  . Asthma  . UTI (urinary tract infection)  . Confusion  . Generalized weakness  . Renal insufficiency, mild  . Anemia  . Hypokalemia  . Diabetes mellitus  . S/P CABG x 3  . CHF, chronic  . S/P placement of cardiac pacemaker  . S/P colonoscopy with polypectomy  . H/O angioedema  . Stroke, acute, thrombotic  . CVA (cerebral vascular accident)   Past Medical History:  Past Medical History  Diagnosis Date  . Coronary artery disease   . Diabetes mellitus   . Pacemaker   . Asthma   . Stroke, acute, thrombotic 08/19/2011   Past Surgical History:  Past Surgical History  Procedure Date  . Cardiac surgery   . Coronary angioplasty with stent placement   . Coronary artery bypass graft   . Abdominal hysterectomy   . Colon polpys    HPI:  78 y/o female admitted to [facility] on 08/18/11 with UTI.  Patient developed stroke like symptoms and transferred to [facility] for further management.  Patient received tPA. CT indicates no visible acute cortical infarct or intracranial hemorrhage.  Patient referred for Cognitive Linguistic Evaluation per Stroke Protocol.     Assessment / Plan / Recommendation Clinical Impression  Presents with moderate cogntitive deficts in areas of  orientation, working memory, sustained attention, problem solving ( visual neglect on left)  , awareness, and sequencing. Patient presents with dysarthria but  Intelligibility increased throughout evaluation.  Skilled ST warranted in acute care setting to improve functional cognitive skills to increase safety in current environment.    SLP Assessment  Patient needs continued Speech Lanaguage Pathology Services    Follow Up Recommendations  Home health SLP    Frequency  and Duration min 2x/week  2 weeks      SLP Goals  SLP Goals Potential to Achieve Goals: Good Potential Considerations: Previous level of function;Cooperation/participation level Progress/Goals/Alternative treatment plan discussed with pt/caregiver and they: No caregivers available SLP Goal #1: Patient will demonstrate intellectual and emergent awareness of current deficits with moderate verbal cues.  SLP Goal #2: Patient will increase orientation to place, time, and situation with moderate verbal and visual cues.  SLP Goal #3: Patient will complete target problem solving tasks with max verbal, visual, and tactile cues.    SLP Evaluation Prior Functioning  Cognitive/Linguistic Baseline: Within functional limits Type of Home: House Lives With: Son Available Help at Discharge: Family Education: 12 grade education  Vocation: Retired   IT consultant  Overall Cognitive Status: Impaired Arousal/Alertness: Awake/alert Orientation Level: Oriented to person;Disoriented to situation;Disoriented to place;Disoriented to time Attention: Focused;Sustained Focused Attention: Appears intact Sustained Attention: Impaired Sustained Attention Impairment: Verbal basic;Functional basic Memory: Impaired Memory Impairment: Storage deficit;Decreased recall of new information;Decreased short term memory Decreased Short Term Memory: Verbal basic;Functional basic Awareness: Impaired Awareness Impairment: Intellectual impairment;Emergent impairment;Anticipatory impairment Problem Solving: Impaired Problem Solving Impairment: Verbal basic;Functional basic Executive Function: Sequencing;Organizing;Decision Making;Self Monitoring;Self Correcting Sequencing: Impaired Sequencing Impairment: Verbal basic;Functional basic Organizing: Impaired Organizing Impairment: Verbal basic;Functional basic Decision Making: Impaired Decision Making Impairment: Verbal basic;Functional basic Self Monitoring: Impaired Self  Monitoring Impairment: Verbal basic;Functional basic Self Correcting: Impaired Self Correcting Impairment: Verbal basic;Functional basic Behaviors: Perseveration Safety/Judgment: Impaired    Comprehension  Auditory Comprehension Overall Auditory Comprehension: Impaired Yes/No Questions: Impaired Basic Immediate  Environment Questions: 50-74% accurate Commands: Impaired Two Step Basic Commands: 25-49% accurate Conversation: Simple Interfering Components: Motor planning;Working Radio broadcast assistant: Dietitian: Not tested Reading Comprehension Reading Status: Impaired Sentence Level: Impaired Interfering Components: Visual scanning;Left neglect/inattention    Expression Expression Primary Mode of Expression: Verbal Verbal Expression Overall Verbal Expression: Appears within functional limits for tasks assessed Written Expression Dominant Hand: Right Written Expression: Not tested   Oral / Motor Oral Motor/Sensory Function Overall Oral Motor/Sensory Function: Impaired Labial ROM: Reduced left Labial Symmetry: Abnormal symmetry left Labial Strength: Reduced Labial Sensation: Reduced Lingual ROM: Reduced left Lingual Symmetry: Abnormal symmetry left Lingual Strength: Reduced Lingual Sensation: Reduced Facial ROM: Reduced left Facial Symmetry: Left droop Facial Strength: Reduced Facial Sensation: Reduced Velum: Within Functional Limits Mandible: Within Functional Limits Motor Speech Overall Motor Speech: Impaired Phonation: Breathy;Low vocal intensity;Hoarse Resonance: Within functional limits Articulation: Impaired Level of Impairment: Sentence Intelligibility: Intelligibility reduced Word: 75-100% accurate Phrase: 75-100% accurate Sentence: 50-74% accurate Conversation: 50-74% accurate Effective Techniques: Slow rate;Increased vocal intensity   Moreen Fowler MS, CCC-SLP [phone]      Cindy Abbott 08/20/2011, 11:57 AM

## 2011-08-20 NOTE — Evaluation (Addendum)
Physical Therapy Evaluation Patient Details Name: Cindy Abbott MRN: [id number] DOB: [birth year] Today's Date: 08/20/2011 Time: 1478-2956 PT Time Calculation (min): 28 min  PT Assessment / Plan / Recommendation Clinical Impression  pt presents with Ischemic CVA.  pt with L sided neglect and L visual deficits.  See OT note for further visual assessment.  pt with limited mobility PTA and now requiring 2 person A for all mobility.      PT Assessment  Patient needs continued PT services    Follow Up Recommendations  Inpatient Rehab    Barriers to Discharge None      Equipment Recommendations  Defer to next venue    Recommendations for Other Services     Frequency Min 4X/week    Precautions / Restrictions Precautions Precautions: Fall Restrictions Weight Bearing Restrictions: No   Pertinent Vitals/Pain No indications of pain.        Mobility  Bed Mobility Bed Mobility: Rolling Left;Sitting - Scoot to Edge of Bed;Supine to Sit Rolling Left: 2: Max assist Supine to Sit: 1: +2 Total assist;HOB elevated Supine to Sit: Patient Percentage: 10% Sitting - Scoot to Edge of Bed: 1: +2 Total assist Sitting - Scoot to Edge of Bed: Patient Percentage: 10% Details for Bed Mobility Assistance: Pt required (A) to sequence task. Pt log rolled to Lt side to provide weight bearing prior to EOB. Pt with Lateral / posterior LT LOB lean Transfers Transfers: Sit to Stand;Stand to Sit;Stand Pivot Transfers Sit to Stand: With upper extremity assist;1: +2 Total assist;From bed Sit to Stand: Patient Percentage: 30% Stand to Sit: 1: +2 Total assist;To chair/3-in-1;With armrests Stand to Sit: Patient Percentage: 30% Details for Transfer Assistance: pt with bil le buckle and tried blocking. Pt with Lt lean with static standing.  Ambulation/Gait Ambulation/Gait Assistance: Not tested (comment) Stairs: No Wheelchair Mobility Wheelchair Mobility: No Modified Rankin (Stroke Patients Only) Pre-Morbid Rankin  Score: Moderate disability Modified Rankin: Severe disability    Exercises     PT Diagnosis: Difficulty walking;Hemiplegia non-dominant side  PT Problem List: Decreased strength;Decreased range of motion;Decreased activity tolerance;Decreased balance;Decreased mobility;Decreased cognition;Decreased knowledge of use of DME;Decreased safety awareness PT Treatment Interventions: DME instruction;Gait training;Functional mobility training;Therapeutic activities;Therapeutic exercise;Balance training;Neuromuscular re-education;Cognitive remediation;Patient/family education   PT Goals Acute Rehab PT Goals PT Goal Formulation: With patient Time For Goal Achievement: 09/03/11 Potential to Achieve Goals: Good Pt will go Supine/Side to Sit: with min assist PT Goal: Supine/Side to Sit - Progress: Goal set today Pt will go Sit to Supine/Side: with min assist PT Goal: Sit to Supine/Side - Progress: Goal set today Pt will go Sit to Stand: with min assist PT Goal: Sit to Stand - Progress: Goal set today Pt will go Stand to Sit: with min assist PT Goal: Stand to Sit - Progress: Goal set today Pt will Transfer Bed to Chair/Chair to Bed: with min assist PT Transfer Goal: Bed to Chair/Chair to Bed - Progress: Goal set today Pt will Ambulate: 16 - 50 feet;with min assist;with least restrictive assistive device PT Goal: Ambulate - Progress: Goal set today  Visit Information  Last PT Received On: 08/20/11 Assistance Needed: +2 PT/OT Co-Evaluation/Treatment: Yes    Subjective Data  Subjective: pt pleasantly confused.   Patient Stated Goal: None Stated.     Prior Functioning  Home Living Lives With: Son Available Help at Discharge: Family Type of Home: House Home Access: Stairs to enter Secretary/administrator of Steps: 3 Entrance Stairs-Rails: Right Home Layout: One level Bathroom Shower/Tub: Tub/shower unit [employer]  Toilet: Standard Bathroom Accessibility: Yes Home Adaptive Equipment: Walker -  rolling;Other (comment) (has RW but does not use it normally) Additional Comments: ALL information obtained from PT evaluation on [id number] at [facility]- Per chart review son reports pt normally only walks ~15 yds due to COPD Prior Function Level of Independence: Independent with assistive device(s) Able to Take Stairs?: Yes Driving: No Vocation: Retired Musician: No difficulties Dominant Hand: Right    Cognition  Overall Cognitive Status: Impaired Area of Impairment: Memory;Following commands;Safety/judgement;Awareness of errors;Problem solving Arousal/Alertness: Awake/alert Orientation Level: Disoriented to;Place;Situation;Time Behavior During Session: [facility] for tasks performed Memory Deficits: pt unable to recall number of children. Pt reports none when asked. Pt reports living with sister however chart states lives with son. Pt reports daughter was the reason for admission Following Commands: Follows one step commands inconsistently Safety/Judgement: Decreased awareness of safety precautions;Decreased safety judgement for tasks assessed Awareness of Errors: Assistance required to identify errors made Problem Solving: Pt asked "should you try to walk by yourself" pt said "i am not sure about that" which was a good response. However pt requesting only one blanket so that "its not too heavy so I walk"    Extremity/Trunk Assessment Right Upper Extremity Assessment RUE ROM/Strength/Tone: Within functional levels RUE Sensation: WFL - Light Touch RUE Coordination: WFL - gross/fine motor Left Upper Extremity Assessment LUE ROM/Strength/Tone: Deficits LUE ROM/Strength/Tone Deficits: hand grasp 2 out 5 (gross grasp), bicep 2- out 5, tricep 3+ out 5, shoulder flexion 2- out 5 LUE Sensation: Deficits LUE Sensation Deficits: poor with sensation testing, slight subluxation at joint LUE Coordination: Deficits Right Lower Extremity Assessment RLE ROM/Strength/Tone: Deficits RLE  ROM/Strength/Tone Deficits: Grossly 3+/5 Left Lower Extremity Assessment LLE ROM/Strength/Tone: Deficits LLE ROM/Strength/Tone Deficits: Difficult to assess as pt inconsistent with following directions, but grossly appears to be 2/5.  pt actively moves LE, but not within full ROM against gravity.   Trunk Assessment Trunk Assessment: Kyphotic   Balance Balance Balance Assessed: Yes Static Sitting Balance Static Sitting - Balance Support: Right upper extremity supported;Feet supported Static Sitting - Level of Assistance: 2: Max assist  End of Session PT - End of Session Equipment Utilized During Treatment: Gait belt Activity Tolerance: Patient tolerated treatment well Patient left: in chair;with call bell/phone within reach Nurse Communication: Mobility status  GP     Sunny Schlein,  [phone] 08/20/2011, 1:07 PM

## 2011-08-20 NOTE — Consult Note (Signed)
Name: Cindy Abbott MRN: [id number] DOB: November 06, 1922  LOS: 2  CRITICAL CARE CONSULT NOTE  History of Present Illness: 78 y/o female with CAD and asthma vs. COPD was admitted otn [facility] on 08/19/11 for an acute ischaemic stroke in the setting of being hospitalized for a UTI.  Apparently she was admitted to [facility] on 8/14 for confusion due to a UTI.  On 8/15 she developed the sudden onset of left sided facial droop and weakness with slurred speech.  She received tPA and was transferred to [facility] for further management. PCCM consulted for shortness of breath which has responded to albuterol.  Interval history:  No acute issues overnight.  No dyspnea this AM.  Vital Signs:   Temp:  [98.3 F (36.8 C)-99.6 F (37.6 C)] 98.3 F (36.8 C) (08/16 0808) Pulse Rate:  [59-66] 60  (08/16 0900) Resp:  [14-25] 15  (08/16 0900) BP: (113-205)/(32-71) 148/39 mmHg (08/16 0900) SpO2:  [97 %-100 %] 100 % (08/16 0900) Weight:  [61 kg (134 lb 7.7 oz)] 61 kg (134 lb 7.7 oz) (08/16 0530)  Physical Examination: Gen: No distress HEENT: PERRL Neck: No JVD PULM: Bilateral diminished air entry, no added breath sounds CV: RRR, no murmurs AB: Soft, non tender, bowel sounds present Ext:No edema / cyanosis Derm: Skin intact Neuro: Awake and alert, oriented to hospital, follows commands on R; L facial droop, does not move L hand, can wiggle L foot, bend leg at hip  Labs  Lab 08/20/11 0001 08/19/11 2016 08/19/11 0917 08/19/11 0444 08/18/11 1914 08/18/11 1658  HGB -- -- -- 9.2* -- 9.8*  HCT -- -- -- 28.5* -- 30.8*  WBC -- -- -- 11.3* -- 10.6*  PLT -- -- -- 208 -- 207  NA -- -- -- 141 -- 144  K 4.1 -- -- 3.1* -- --  CL -- -- -- 105 -- 105  CO2 -- -- -- 24 -- 28  GLUCOSE -- -- -- 313* -- 355*  BUN -- -- -- 31* -- 29*  CREATININE -- -- -- 1.30* -- 1.43*  CALCIUM -- -- -- 8.2* -- 8.8  MG -- -- 1.6 -- -- --  PHOS -- -- -- -- -- --  AST -- -- -- -- -- 15  ALT -- -- -- -- -- 11  ALKPHOS -- -- -- -- -- 75    BILITOT -- -- -- -- -- 1.5*  PROT -- -- -- -- -- 6.2  ALBUMIN -- -- -- -- -- 2.4*  INR -- 1.11 -- -- -- --  APTT -- 33 -- -- -- --  INR -- 1.11 -- -- -- --  LATICACIDVEN -- -- -- -- 1.9 --  TROPONINI -- -- -- -- -- <0.30   ABG No results found for this basename: phart,  pco2,  pco2art,  po2,  po2art,  hco3,  tco2,  acidbasedef,  o2sat   CXR: Cardiomegally, no pulm edema  ASSESSMENT AND PLAN:  Acute ischemic CVA, s/p tPA, residual L weakness History of congestive heart failure, but no evidence of exacerbation "Asthma" vs COPD Dyspnea - resolved UTI  -->  Supplemental oxygen, goal SpO2>92 -->  Bronchodilators  - bronchospasm -->  Ceftriaxone - UTI -->  CVA regimen per Neurology -->  PCCM will sign off, please reconsult PRN  Orlean Bradford, M.D., F.C.C.P. Pulmonary and Critical Care Medicine [facility] Cell: [phone] Pager: [phone]  08/20/2011, 10:30 AM

## 2011-08-20 NOTE — Consult Note (Signed)
Physical Medicine and Rehabilitation Consult Reason for Consult: CVA Referring Physician: Dr. Pearlean Brownie   HPI: Cindy Abbott is a 78 y.o. right-handed female with history of coronary artery disease with pacemaker, diabetes mellitus. Admitted 08/19/2011 from [facility] with acute onset of left-sided weakness slurred speech. Initial cranial CT scan showed age-related atrophy without acute change. Patient did receive TPA. Full workup is ongoing per neurology services. Carotid Dopplers with bilateral 40-59% ICA stenosis. Echocardiogram with ejection fraction of 70%. A urine study was completed showing gram-negative rods and placed on Rocephin. Hemoglobin A1c of 8.2 with insulin therapy as directed. Physical occupational therapy ongoing with recommendations of physical medicine rehabilitation consult to consider inpatient rehabilitation services CT angiogram of the brain demonstrates right ICA occlusion  Review of Systems  Cardiovascular: Positive for palpitations.  Gastrointestinal: Positive for constipation.  Musculoskeletal: Positive for myalgias.  Neurological: Positive for dizziness.  All other systems reviewed and are negative.   Past Medical History  Diagnosis Date  . Coronary artery disease   . Diabetes mellitus   . Pacemaker   . Asthma   . Stroke, acute, thrombotic 08/19/2011   Past Surgical History  Procedure Date  . Cardiac surgery   . Coronary angioplasty with stent placement   . Coronary artery bypass graft   . Abdominal hysterectomy   . Colon polpys    History reviewed. No pertinent family history. Social History:  reports that she has never smoked. She does not have any smokeless tobacco history on file. She reports that she does not drink alcohol or use illicit drugs. Allergies:  Allergies  Allergen Reactions  . Lisinopril    Medications Prior to Admission  Medication Sig Dispense Refill  . albuterol (PROVENTIL) (5 MG/ML) 0.5% nebulizer solution Take 2.5 mg by  nebulization every 6 (six) hours as needed.      Marland Kitchen aspirin EC 81 MG tablet Take 81 mg by mouth every morning.      . bumetanide (BUMEX) 1 MG tablet Take 1 mg by mouth daily. *Only take if fluid retention is current. Provided via son discretion. Patient is to take Klor-Con with this medication*      . cloNIDine (CATAPRES - DOSED IN MG/24 HR) 0.2 mg/24hr patch Place 1 patch onto the skin once a week.      . insulin lispro (HUMALOG) 100 UNIT/ML injection Inject 5-10 Units into the skin 3 (three) times daily before meals. *Inject 5 units in the morning and 10 units at lunch and at dinner*      . isosorbide mononitrate (IMDUR) 30 MG 24 hr tablet Take 30 mg by mouth daily.      Marland Kitchen latanoprost (XALATAN) 0.005 % ophthalmic solution Place 1 drop into both eyes at bedtime.      . metoprolol (LOPRESSOR) 50 MG tablet Take 50 mg by mouth 2 (two) times daily.      . potassium chloride SA (K-DUR,KLOR-CON) 20 MEQ tablet Take 20 mEq by mouth daily. *To be taken when Bumex is taken for fluid retention*        Home: Home Living Lives With: Son Available Help at Discharge: Family Type of Home: House Home Access: Stairs to enter Secretary/administrator of Steps: 3 Entrance Stairs-Rails: Right Home Layout: One level Bathroom Shower/Tub: Engineer, manufacturing systems: Standard Bathroom Accessibility: Yes Home Adaptive Equipment: Walker - rolling;Other (comment) (has RW but does not use it normally) Additional Comments: ALL information obtained from PT evaluation on [id number] at [facility]- Per chart review son  reports pt normally only walks ~15 yds due to COPD  Functional History: Prior Function Able to Take Stairs?: Yes Driving: No Vocation: Retired Functional Status:  Mobility: Bed Mobility Bed Mobility: Rolling Left;Sitting - Scoot to [employer] of Bed;Supine to [employer] Left: 2: Max assist Supine to Sit: 1: +2 Total assist;HOB elevated Supine to Sit: Patient Percentage: 10% Sitting - Scoot to Edge  of Bed: 1: +2 Total assist Sitting - Scoot to Edge of Bed: Patient Percentage: 10% Sit to Supine: 6: Modified independent (Device/Increase time) Transfers Transfers: Sit to Stand Sit to Stand: With upper extremity assist;1: +2 Total assist;From bed Sit to Stand: Patient Percentage: 30% Stand to Sit: 1: +2 Total assist;To chair/3-in-1;With armrests Stand to Sit: Patient Percentage: 30% Ambulation/Gait Ambulation/Gait Assistance: 5: Supervision Ambulation Distance (Feet): 15 Feet Assistive device: Rolling walker Ambulation/Gait Assistance Details: Pt ambulates slowly with RW; attempted ambulation without RW but pt was to unsteady Gait velocity: slow    ADL: ADL Grooming: Simulated;Wash/dry hands;Wash/dry face;Moderate assistance Where Assessed - Grooming: Supported sitting Upper Body Dressing: Performed;Maximal assistance Where Assessed - Upper Body Dressing: Supported sitting Toilet Transfer: Simulated;+2 Total assistance Toilet Transfer Method: Surveyor, minerals: Raised toilet seat with arms (or 3-in-1 over toilet) Equipment Used: Gait belt Transfers/Ambulation Related to ADLs: pt unable to perform at this time ADL Comments: Pt demonstrates Lt neglect. Pt pleasant and agreeable to treatment. Pt reports no deficits or changes since admission. Pt oriented to self and place (hospital) pt unable to state city or name of hospital. Pt states "i think its a hospital" Pt unable to state reason for admission  pt reports "my daughter thought I was sick". Pt with strong Lt lateral lean. Pt weight bearing on Lt UE and returned to EOB sitting support with increased truck activation. Pt attending to Lt side with mod v/c or tracking an object. Pt with Lt facial droop present. Pt with poor awareness of deficits or need for assistance. Pt positioned in chair with Rn (A)ing with thicken liquids  Cognition: Cognition Overall Cognitive Status: Impaired Arousal/Alertness:  Awake/alert Orientation Level: Oriented to person;Disoriented to situation;Disoriented to place;Disoriented to time Attention: Focused;Sustained Focused Attention: Appears intact Sustained Attention: Impaired Sustained Attention Impairment: Verbal basic;Functional basic Memory: Impaired Memory Impairment: Storage deficit;Decreased recall of new information;Decreased short term memory Decreased Short Term Memory: Verbal basic;Functional basic Awareness: Impaired Awareness Impairment: Intellectual impairment;Emergent impairment;Anticipatory impairment Problem Solving: Impaired Problem Solving Impairment: Verbal basic;Functional basic Executive Function: Sequencing;Organizing;Decision Making;Self Monitoring;Self Correcting Sequencing: Impaired Sequencing Impairment: Verbal basic;Functional basic Organizing: Impaired Organizing Impairment: Verbal basic;Functional basic Decision Making: Impaired Decision Making Impairment: Verbal basic;Functional basic Self Monitoring: Impaired Self Monitoring Impairment: Verbal basic;Functional basic Self Correcting: Impaired Self Correcting Impairment: Verbal basic;Functional basic Behaviors: Perseveration Safety/Judgment: Impaired Cognition Overall Cognitive Status: Impaired Area of Impairment: Memory;Following commands;Safety/judgement;Awareness of errors;Problem solving Arousal/Alertness: Awake/alert Orientation Level: Disoriented to;Place;Situation;Time Behavior During Session: [facility] for tasks performed Memory Deficits: pt unable to recall number of children. Pt reports none when asked. Pt reports living with sister however chart states lives with son. Pt reports daughter was the reason for admission Following Commands: Follows one step commands inconsistently Safety/Judgement: Decreased awareness of safety precautions;Decreased safety judgement for tasks assessed Awareness of Errors: Assistance required to identify errors made Problem Solving: Pt  asked "should you try to walk by yourself" pt said "i am not sure about that" which was a good response. However pt requesting only one blanket so that "its not too heavy so I walk"  Blood pressure  148/39, pulse 60, temperature 98.3 F (36.8 C), temperature source Oral, resp. rate 15, height 5\' 2"  (1.575 m), weight 61 kg (134 lb 7.7 oz), SpO2 100.00%. Physical Exam  HENT:       Left facial droop  Eyes:       Pupils reactive to light  Neck: Neck supple. No thyromegaly present.  Cardiovascular: Normal rate and regular rhythm.   Pulmonary/Chest: Breath sounds normal. No respiratory distress. She has no wheezes.  Abdominal: Bowel sounds are normal. She exhibits no distension. There is no tenderness.  Neurological: She is alert.       Patient could not give her appropriate age or place. She does show some left-sided neglect as well as dysarthric speech. She would follow basic commands.  Skin: Skin is warm and dry.  left upper extremity strength is 0/5. Left lower tremor strength 2 minus/5 in the hip extensor otherwise 0/5 Right-sided strength is 4 minus/5 in the right deltoid, biceps, triceps, grip, hip flexor, knee extensor ankle dorsiflexor Sensation is intact to light touch in bilateral upper and lower extremities There is a right gaze preference. She can be cue to look toward the left. There is a left central 7 Mild dysarthria noted.  Results for orders placed during the hospital encounter of 08/18/11 (from the past 24 hour(s))  GLUCOSE, CAPILLARY     Status: Abnormal   Collection Time   08/19/11  4:47 PM      Component Value Range   Glucose-Capillary 245 (*) 70 - 99 mg/dL  GLUCOSE, CAPILLARY     Status: Abnormal   Collection Time   08/19/11  5:31 PM      Component Value Range   Glucose-Capillary 252 (*) 70 - 99 mg/dL  APTT     Status: Normal   Collection Time   08/19/11  8:16 PM      Component Value Range   aPTT 33  24 - 37 seconds  PROTIME-INR     Status: Normal   Collection  Time   08/19/11  8:16 PM      Component Value Range   Prothrombin Time 14.5  11.6 - 15.2 seconds   INR 1.11  0.00 - 1.49  GLUCOSE, CAPILLARY     Status: Abnormal   Collection Time   08/19/11  8:23 PM      Component Value Range   Glucose-Capillary 211 (*) 70 - 99 mg/dL   Comment 1 Notify RN    MRSA PCR SCREENING     Status: Normal   Collection Time   08/19/11  9:41 PM      Component Value Range   MRSA by PCR NEGATIVE  NEGATIVE  POTASSIUM     Status: Normal   Collection Time   08/20/11 12:01 AM      Component Value Range   Potassium 4.1  3.5 - 5.1 mEq/L  HEMOGLOBIN A1C     Status: Abnormal   Collection Time   08/20/11  4:07 AM      Component Value Range   Hemoglobin A1C 8.2 (*) <5.7 %   Mean Plasma Glucose 189 (*) <117 mg/dL  LIPID PANEL     Status: Abnormal   Collection Time   08/20/11  4:07 AM      Component Value Range   Cholesterol 121  0 - 200 mg/dL   Triglycerides 161  <096 mg/dL   HDL 27 (*) >04 mg/dL   Total CHOL/HDL Ratio 4.5     VLDL 24  0 -  40 mg/dL   LDL Cholesterol 70  0 - 99 mg/dL  GLUCOSE, CAPILLARY     Status: Abnormal   Collection Time   08/20/11  9:23 AM      Component Value Range   Glucose-Capillary 202 (*) 70 - 99 mg/dL   Dg Chest 1 View  4/78/2956  *RADIOLOGY REPORT*  Clinical Data: Acute onset weakness and slurred speech.  CHEST - 1 VIEW  Comparison: Chest 08/18/2011.  Findings: The patient and is status post CABG.  Marked enlargement of the cardiopericardial silhouette is again seen.  Lungs are clear.  No pneumothorax or effusion.  IMPRESSION: Marked enlargement of the cardiopericardial silhouette.  No acute abnormality.  Original Report Authenticated By: Bernadene Bell. Maricela Curet, M.D.   Ct Head Wo Contrast  08/19/2011  *RADIOLOGY REPORT*  Clinical Data: Code stroke.  Agonal breathing.  Left-sided facial droop and left-sided weakness.  CT HEAD WITHOUT CONTRAST  Technique:  Contiguous axial images were obtained from the base of the skull through the vertex  without contrast.  Comparison: None.  Findings: Slight motion degradation.  Overall diagnostic.  Moderate age related atrophy with chronic microvascular ischemic change.  No definite acute cortical infarction, hemorrhage, mass lesion, hydrocephalus, or extra-axial fluid.  Intact calvarium. Clear sinuses and mastoids. Moderate vascular calcification.  IMPRESSION: Age related changes with atrophy and chronic microvascular ischemic change.  No visible acute cortical infarct or intracranial hemorrhage.  Critical Value/emergent results were called by telephone at the time of interpretation on 08/19/2011 at 6:30 p.m. to Dr. Sherrie Mustache, who verbally acknowledged these results.  Original Report Authenticated By: Elsie Stain, M.D.   Dg Chest Port 1 View  08/19/2011  *RADIOLOGY REPORT*  Clinical Data: Short of breath.  PORTABLE CHEST - 1 VIEW  Comparison: 08/19/2011.  Findings: Cardiomegaly.  Median sternotomy.  No airspace disease. No effusion.  Single lead left subclavian cardiac pacemaker appears unchanged.  IMPRESSION: Cardiomegaly without failure.  No acute abnormality or interval change.  Original Report Authenticated By: Andreas Newport, M.D.   Dg Chest Portable 1 View  08/18/2011  *RADIOLOGY REPORT*  Clinical Data: Shortness of breath.  PORTABLE CHEST - 1 VIEW  Comparison: None.  Findings: Trachea is midline.  Heart is enlarged.  Pacemaker lead tip projects over the right ventricle.  Lungs are clear.  No pleural fluid.  IMPRESSION: No acute findings.  Original Report Authenticated By: Reyes Ivan, M.D.    Assessment/Plan: Diagnosis: right frontal and parietal infarct causing left hemiparesis as well as left neglect, left central 7 palsy as well as dysarthria. 1. Does the need for close, 24 hr/day medical supervision in concert with the patient's rehab needs make it unreasonable for this patient to be served in a less intensive setting? Yes 2. Co-Morbidities requiring supervision/potential  complications: coronary artery disease, urinary tract infection, renal insufficiency, diabetes, CHF 3. Due to bladder management, bowel management, safety, skin/wound care, disease management, medication administration and patient education, does the patient require 24 hr/day rehab nursing? Yes 4. Does the patient require coordinated care of a physician, rehab nurse, PT (1-2 hrs/day, 5 days/week), OT (1-2 hrs/day, 5 days/week) and SLP (0.5-1 hrs/day, 5 days/week) to address physical and functional deficits in the context of the above medical diagnosis(es)? Yes Addressing deficits in the following areas: balance, endurance, locomotion, strength, transferring, bowel/bladder control, bathing, dressing, grooming, toileting and cognition 5. Can the patient actively participate in an intensive therapy program of at least 3 hrs of therapy per day at least 5 days per week?  Potentially 6. The potential for patient to make measurable gains while on inpatient rehab is good 7. Anticipated functional outcomes upon discharge from inpatient rehab are minimal assist mobility with PT, minimal assist ADLs with OT, safe oral intake with SLP. 8. Estimated rehab length of stay to reach the above functional goals is: 3 weeks 9. Does the patient have adequate social supports to accommodate these discharge functional goals? need to establish availability of 24 7 caregiver who is able to provide physical assistance 10. Anticipated D/C setting: Home 11. Anticipated post D/C treatments: HH therapy 12. Overall Rehab/Functional Prognosis: good  RECOMMENDATIONS: This patient's condition is appropriate for continued rehabilitative care in the following setting: if patient has 24 7 physical assistance post discharge then CIR if not then to SNF once medically stable Patient has agreed to participate in recommended program. Potentially Note that insurance prior authorization may be required for reimbursement for recommended  care.  Comment:    08/20/2011

## 2011-08-21 DIAGNOSIS — I6521 Occlusion and stenosis of right carotid artery: Secondary | ICD-10-CM | POA: Diagnosis present

## 2011-08-21 LAB — GLUCOSE, CAPILLARY
Glucose-Capillary: 178 mg/dL — ABNORMAL HIGH (ref 70–99)
Glucose-Capillary: 255 mg/dL — ABNORMAL HIGH (ref 70–99)

## 2011-08-21 MED ORDER — ASPIRIN 325 MG PO TABS
325.0000 mg | ORAL_TABLET | Freq: Every day | ORAL | Status: DC
  Administered 2011-08-21 – 2011-08-23 (×3): 325 mg via ORAL
  Filled 2011-08-21 (×3): qty 1

## 2011-08-21 MED ORDER — PANTOPRAZOLE SODIUM 40 MG PO TBEC
40.0000 mg | DELAYED_RELEASE_TABLET | Freq: Every day | ORAL | Status: DC
  Administered 2011-08-21 – 2011-08-23 (×3): 40 mg via ORAL
  Filled 2011-08-21 (×3): qty 1

## 2011-08-21 MED ORDER — IPRATROPIUM BROMIDE 0.02 % IN SOLN
0.5000 mg | Freq: Two times a day (BID) | RESPIRATORY_TRACT | Status: DC
  Administered 2011-08-22 (×2): 0.5 mg via RESPIRATORY_TRACT
  Filled 2011-08-21 (×2): qty 2.5

## 2011-08-21 MED ORDER — IPRATROPIUM BROMIDE 0.02 % IN SOLN: 0.5000 mg | Freq: Two times a day (BID) | RESPIRATORY_TRACT | Status: DC

## 2011-08-21 MED ORDER — ALBUTEROL SULFATE (5 MG/ML) 0.5% IN NEBU
2.5000 mg | INHALATION_SOLUTION | Freq: Two times a day (BID) | RESPIRATORY_TRACT | Status: DC
  Administered 2011-08-22 (×2): 2.5 mg via RESPIRATORY_TRACT
  Filled 2011-08-21 (×2): qty 0.5

## 2011-08-21 MED ORDER — ALBUTEROL SULFATE (5 MG/ML) 0.5% IN NEBU: 2.5000 mg | INHALATION_SOLUTION | Freq: Four times a day (QID) | RESPIRATORY_TRACT | Status: DC

## 2011-08-21 NOTE — Progress Notes (Signed)
Stroke Team Progress Note  HISTORY Cindy Abbott is an 78 y.o. female who was admitted to [facility] on 08/18/2011 with a UTI. Today 08/19/2011 was noted by staff to be doing well until about 1730 when she was noted to have left facial droop, left sided weakness and slurred speech. Family was with the patient and notified staff. Patient soon became less alert as well. Code stroke was called. Initial exam is documented as " the patient was semi-alert and tried to follow commands as ordered. She had a visual field deficit with left visual field neglect, left facial droop, and flaccid paresis of her left arm and left leg. She was able to lift up her her right arm against gravity approximately 45 and was able to flex her right leg. She had noticeable dysarthria". Head CT was unremarkable. Teleneurology was consulted and patient was given tPA. She is now transferred to [facility] for further management.   SUBJECTIVE No family  is at the bedside.  She has persistent left hemiplegia and dysarthria. She has remained stable overnight. Speech is improved slightly though left-sided weakness persists. CT angiogram yesterday showed occlusion of the right internal carotid artery in the neck. There is a small right subcortical infarct noted.  OBJECTIVE Most recent Vital Signs: Filed Vitals:   08/21/11 0600 08/21/11 0700 08/21/11 0800 08/21/11 0805  BP: 147/49 163/51    Pulse: 60 60    Temp:   99.4 F (37.4 C)   TempSrc:   Oral   Resp: 15 15    Height:      Weight:      SpO2: 100% 99%  100%   CBG (last 3)   Basename 08/20/11 2232 08/20/11 1715 08/20/11 1256  GLUCAP 178* 174* 216*   Intake/Output from previous day: 08/16 0701 - 08/17 0700 In: 1250 [I.V.:1200; IV Piggyback:50] Out: 1150 [Urine:1150]  IV Fluid Intake:      . sodium chloride 50 mL/hr at 08/21/11 0900  . DISCONTD: 0.9 % NaCl with KCl 40 mEq / L 50 mL/hr at 08/19/11 2038    MEDICATIONS     . ipratropium  0.5 mg Nebulization Q6H   And  . albuterol   2.5 mg Nebulization Q6H  . antiseptic oral rinse  15 mL Mouth Rinse QID  . aspirin  300 mg Rectal Daily  . cefTRIAXone (ROCEPHIN)  IV  1 g Intravenous Q24H  . cloNIDine  0.2 mg Transdermal Weekly  . hydrALAZINE  10 mg Intravenous Once  . insulin aspart  0-15 Units Subcutaneous TID WC  . insulin aspart  0-5 Units Subcutaneous QHS  . insulin glargine  5 Units Subcutaneous QHS  . isosorbide mononitrate  30 mg Oral Daily  . latanoprost  1 drop Both Eyes QHS  . metoprolol  25 mg Oral BID  . pantoprazole (PROTONIX) IV  40 mg Intravenous QHS  . potassium chloride SA  20 mEq Oral BID  . sodium chloride  3 mL Intravenous Q12H   PRN:  sodium chloride, acetaminophen, acetaminophen, albuterol, iohexol, ipratropium, ondansetron (ZOFRAN) IV, ondansetron, RESOURCE THICKENUP CLEAR, senna-docusate, sodium chloride  Diet:  Dysphagia  liquids Activity:  Bedrest DVT Prophylaxis:  SCDs   CLINICALLY SIGNIFICANT STUDIES Basic Metabolic Panel:   Lab 08/20/11 0001 08/19/11 0917 08/19/11 0444 08/18/11 1658  NA -- -- 141 144  K 4.1 -- 3.1* --  CL -- -- 105 105  CO2 -- -- 24 28  GLUCOSE -- -- 313* 355*  BUN -- -- 31* 29*  CREATININE -- --  1.30* 1.43*  CALCIUM -- -- 8.2* 8.8  MG -- 1.6 -- --  PHOS -- -- -- --   Liver Function Tests:   Lab 08/18/11 1658  AST 15  ALT 11  ALKPHOS 75  BILITOT 1.5*  PROT 6.2  ALBUMIN 2.4*   CBC:   Lab 08/19/11 0444 08/18/11 1658  WBC 11.3* 10.6*  NEUTROABS -- 9.2*  HGB 9.2* 9.8*  HCT 28.5* 30.8*  MCV 76.2* 77.0*  PLT 208 207   Coagulation:   Lab 08/19/11 2016  LABPROT 14.5  INR 1.11   Cardiac Enzymes:   Lab 08/18/11 1658  CKTOTAL --  CKMB --  CKMBINDEX --  TROPONINI <0.30   Urinalysis:   Lab 08/18/11 1732  COLORURINE YELLOW  LABSPEC 1.020  PHURINE 5.5  GLUCOSEU >1000*  HGBUR MODERATE*  BILIRUBINUR NEGATIVE  KETONESUR NEGATIVE  PROTEINUR 30*  UROBILINOGEN 0.2  NITRITE NEGATIVE  LEUKOCYTESUR SMALL*   Urine Culture > 100K GNR, final  report pending  Lipid Panel    Component Value Date/Time   CHOL 121 08/20/2011 0407   TRIG 118 08/20/2011 0407   HDL 27* 08/20/2011 0407   CHOLHDL 4.5 08/20/2011 0407   VLDL 24 08/20/2011 0407   LDLCALC 70 08/20/2011 0407   HgbA1C  Lab Results  Component Value Date   HGBA1C 8.2* 08/20/2011   Urine Drug Screen:   No results found for this basename: labopia,  cocainscrnur,  labbenz,  amphetmu,  thcu,  labbarb    Alcohol Level: No results found for this basename: ETH:2 in the last 168 hours  CT of the brain  08/19/2011  Age related changes with atrophy and chronic microvascular ischemic change.  No visible acute cortical infarct or intracranial hemorrhage.    MRI of the brain  pacer  MRA of the brain  pacer  2D Echocardiogram  08/19/2011 EF 55-60% with no source of embolus. Probable hypokinesis of the apical myocardium. Mitral valve: Mild to at worst moderate regurgitation.  Carotid Doppler    CXR   08/19/2011  Marked enlargement of the cardiopericardial silhouette.  No acute abnormality.  08/19/2011  Cardiomegaly without failure.  No acute abnormality or interval change. 08/18/2011  : No acute findings.   EKG  Paced rhythm.   Therapy Recommendations PT - ; OT - ; ST -   Physical Exam   Elderly african american lady not in distress.Awake alert. Afebrile. Head is nontraumatic. Neck is supple without bruit. Hearing is normal. Cardiac exam soft ejection  murmur.no gallop. Lungs are clear to auscultation. Distal pulses are well felt.  Neurological Exam ; Awake alert oriented x 2. mild dysarthria. No aphasia. Right gaze preference but can look to left.does not blink to threat on the left but does so on the right.Moderate left lower facial weakness. Tongue deviates minimally to the left. Dense left hemiplegia with grade 3 strength LUE and 3/5 strength LLE. Diminished sensation on the left. Mild left hemi-neglect. Left plantar is upgoing. Right plantar is downgoing.  . ASSESSMENT Cindy Abbott  is a 78 y.o. female presenting with acute left facial droop, left sided weakness and slurred speech while an IP at [facility] for UTI due to RICA occlusion. Specialists on Call were consulted and patient was administered tPA, 08/19/2011 at 1933 per RN report. Intervention was not considered. Suspected embolic infarct, workup underway. On aspirin 81 mg orally every day prior to admission. aspirin 325 mg orally every day ordered daily on admission. Patient with resultant left hemiparesis.  -diabetes, uncontrolleld,  HgbA1c 8.2 -LDL 70 -CAD -Pacemaker -Asthma -UTI, > 100k GNR, final report pending, on Rocephin IV D#2 -anemia, Hgb 9.2 -leukocytosis, Wbc 11.2  Hospital day # 3  TREATMENT/PLAN -Aspirin plus Plavix for 3 months followed by aspirin alone, Cannot do  MRI/A secondary to pacemaker -OOB. Therapy evals. Rehab consult. -get Specialist on Call records/consult note from [facility] -strict blood pressure control.   -Mobilize out of bed. Transfer to the floor. Resume home medications. Delia Heady, MD Medical Director [facility] Pager: [phone] 08/21/2011 9:29 AM   08/21/2011 9:24 AM

## 2011-08-22 ENCOUNTER — Inpatient Hospital Stay (HOSPITAL_COMMUNITY): Payer: Medicare Other

## 2011-08-22 LAB — GLUCOSE, CAPILLARY
Glucose-Capillary: 250 mg/dL — ABNORMAL HIGH (ref 70–99)
Glucose-Capillary: 180 mg/dL — ABNORMAL HIGH (ref 70–99)
Glucose-Capillary: 140 mg/dL — ABNORMAL HIGH (ref 70–99)
Glucose-Capillary: 195 mg/dL — ABNORMAL HIGH (ref 70–99)

## 2011-08-22 MED ORDER — INSULIN GLARGINE 100 UNIT/ML ~~LOC~~ SOLN
10.0000 [IU] | Freq: Every day | SUBCUTANEOUS | Status: DC
  Administered 2011-08-22: 10 [IU] via SUBCUTANEOUS

## 2011-08-22 MED ORDER — INSULIN ASPART 100 UNIT/ML ~~LOC~~ SOLN
5.0000 [IU] | Freq: Every day | SUBCUTANEOUS | Status: DC
  Administered 2011-08-23: 5 [IU] via SUBCUTANEOUS

## 2011-08-22 MED ORDER — INSULIN ASPART 100 UNIT/ML ~~LOC~~ SOLN
10.0000 [IU] | Freq: Two times a day (BID) | SUBCUTANEOUS | Status: DC
  Administered 2011-08-22 – 2011-08-23 (×3): 10 [IU] via SUBCUTANEOUS

## 2011-08-22 NOTE — Progress Notes (Signed)
History: Cindy Abbott is an 78 y.o. female who was admitted to [facility] on 08/18/2011 with a UTI. Today 08/19/2011 was noted by staff to be doing well until about 1730 when she was noted to have left facial droop, left sided weakness and slurred speech. Family was with the patient and notified staff. Patient soon became less alert as well. Code stroke was called. Initial exam is documented as " the patient was semi-alert and tried to follow commands as ordered. She had a visual field deficit with left visual field neglect, left facial droop, and flaccid paresis of her left arm and left leg. She was able to lift up her her right arm against gravity approximately 45 and was able to flex her right leg. She had noticeable dysarthria". Head CT was unremarkable. Teleneurology was consulted and patient was given tPA. She is now transferred to [facility] for further management.   Subjective: I'm doing ok.  Feeling stronger.  Having trouble putting my glasses on.    Objective: BP 180/44  Pulse 59  Temp 98.5 F (36.9 C) (Oral)  Resp 18  Ht 5\' 2"  (1.575 m)  Wt 69.8 kg (153 lb 14.1 oz)  BMI 28.15 kg/m2  SpO2 98%  CBGs  Basename 08/22/11 0659 08/21/11 2151 08/21/11 1624 08/21/11 1120 08/21/11 0754 08/20/11 2232 08/20/11 1715 08/20/11 1256 08/20/11 0923 08/19/11 2023  GLUCAP 180* 250* 255* 288* 225* 178* 174* 216* 202* 211*    Diet: D1, nectar  Activity: up with assistance  DVT Prophylaxis: SCD  Medications: Scheduled:   . albuterol  2.5 mg Nebulization BID   And  . ipratropium  0.5 mg Nebulization BID  . antiseptic oral rinse  15 mL Mouth Rinse QID  . aspirin  325 mg Oral Daily  . cefTRIAXone (ROCEPHIN)  IV  1 g Intravenous Q24H  . cloNIDine  0.2 mg Transdermal Weekly  . insulin aspart  0-15 Units Subcutaneous TID WC  . insulin aspart  0-5 Units Subcutaneous QHS  . insulin glargine  5 Units Subcutaneous QHS  . isosorbide mononitrate  30 mg Oral Daily  . latanoprost  1 drop Both Eyes QHS  . metoprolol  25  mg Oral BID  . pantoprazole  40 mg Oral Q1200  . potassium chloride SA  20 mEq Oral BID  . sodium chloride  3 mL Intravenous Q12H  . DISCONTD: albuterol  2.5 mg Nebulization Q6H  . DISCONTD: albuterol  2.5 mg Nebulization Q6H  . DISCONTD: hydrALAZINE  10 mg Intravenous Once  . DISCONTD: ipratropium  0.5 mg Nebulization Q6H  . DISCONTD: ipratropium  0.5 mg Nebulization BID  . DISCONTD: pantoprazole (PROTONIX) IV  40 mg Intravenous QHS    Neurologic Exam: Mental Status: Alert, oriented to self and place, thought content appropriate.  Speech fluent without evidence of aphasia. Able to follow 3 step commands without difficulty. Cranial Nerves: II- does not blink to threat on the left. III/IV/VI-Extraocular movements intact.  Pupils reactive bilaterally. V/VII-mild lower left facial droop VIII-hearing grossly intact IX/X-not assessed XI-bilateral shoulder shrug intact XII-midline tongue extension Motor: 3+/5 LUE/LLE, 5/5 RUE/RLE with normal tone and bulk.  No neglect. Sensory: Light touch intact throughout, bilaterally Deep Tendon Reflexes: 2+ and symmetric throughout Plantars: up going on left. Cerebellar: Normal finger-to-nose on right.  Lungs: decreased air movement throughout.  No rhonchi or wheeze.  Lab Results: Basic Metabolic Panel:  Lab 08/20/11 4098 08/19/11 0917 08/19/11 0444 08/18/11 1658  NA -- -- 141 144  K 4.1 -- 3.1* --  CL -- -- 105 105  CO2 -- -- 24 28  GLUCOSE -- -- 313* 355*  BUN -- -- 31* 29*  CREATININE -- -- 1.30* 1.43*  CALCIUM -- -- 8.2* 8.8  MG -- 1.6 -- --  PHOS -- -- -- --   Liver Function Tests:  Lab 08/18/11 1658  AST 15  ALT 11  ALKPHOS 75  BILITOT 1.5*  PROT 6.2  ALBUMIN 2.4*   CBC:  Lab 08/19/11 0444 08/18/11 1658  WBC 11.3* 10.6*  NEUTROABS -- 9.2*  HGB 9.2* 9.8*  HCT 28.5* 30.8*  MCV 76.2* 77.0*  PLT 208 207   CBG:  Lab 08/22/11 0659 08/21/11 2151 08/21/11 1624 08/21/11 1120 08/21/11 0754 08/20/11 2232  GLUCAP 180* 250*  255* 288* 225* 178*   Hemoglobin A1C:  Lab 08/20/11 0407  HGBA1C 8.2*   Fasting Lipid Panel:  Lab 08/20/11 0407  CHOL 121  HDL 27*  LDLCALC 70  TRIG 295  CHOLHDL 4.5  LDLDIRECT --   Thyroid Function Tests:  Lab 08/19/11 0917  TSH 0.665  T4TOTAL --  FREET4 --  T3FREE --  THYROIDAB --   Coagulation:  Lab 08/19/11 2016  LABPROT 14.5  INR 1.11   Urinalysis:  Lab 08/18/11 1732  COLORURINE YELLOW  LABSPEC 1.020  PHURINE 5.5  GLUCOSEU >1000*  HGBUR MODERATE*  BILIRUBINUR NEGATIVE  KETONESUR NEGATIVE  PROTEINUR 30*  UROBILINOGEN 0.2  NITRITE NEGATIVE  LEUKOCYTESUR SMALL*    08/19/2011 CT of the brain Age related changes with atrophy and chronic microvascular ischemic change. No visible acute cortical infarct or intracranial hemorrhage.   08/20/2011 CTA NECK  Findings: Peripheral right upper lobe 4.1 mm nodule. If the patient is at high risk for bronchogenic carcinoma, follow-up chest CT at 1 year is recommended.  If the patient is at low risk, no follow-up is needed.  This recommendation follows the consensus statement: Guidelines for Management of Small Pulmonary Nodules Detected on CT Scans:  A Statement from the Fleischner Society as published in Radiology 2005; 237:395-400.  Prominent plaque aortic arch and origin great vessels.  Mild to slightly moderate narrowing of the origin great vessels.  Innominate artery bifurcation prominent plaque with mild to moderate narrowing.  Plaque of the right common carotid artery with mild to moderate narrowing.  Right internal carotid artery is occluded just beyond its origin.  Plaque with narrowing of the left common carotid artery. 68% maximal stenosis of the left common carotid artery 8 cm above its origin and 2 cm proximal to the bifurcation.  Plaque of the proximal left internal carotid artery with 72% diameter stenosis.  Ectatic left internal carotid artery distal vertical segment.  Mild to moderate narrowing left subclavian  artery.  Moderate to marked narrowing proximal left vertebral artery.  Moderate narrowing proximal right vertebral artery.   Review of the MIP images confirms the above findings.  IMPRESSION: Right internal carotid artery is occluded just beyond its origin.  Plaque of the right common carotid artery with mild to moderate narrowing.  68% maximal stenosis of the left common carotid artery 8 cm above its origin and 2 cm proximal to the bifurcation.  Plaque of the proximal left internal carotid artery with 72% diameter stenosis.  Mild to moderate narrowing left subclavian artery.  Moderate to marked narrowing proximal left vertebral artery.  Moderate narrowing proximal right vertebral artery.    08/20/2011 CTA HEAD  Findings:  Acute small posterior right lenticular nucleus infarct. Subtle haziness of gray-white differentiation right hemisphere  may reflect early acute larger right hemispheric infarct.  Small vessel disease type changes.  Global atrophy without hydrocephalus.  No intracranial hemorrhage.  No intracranial mass or abnormal enhancement.  Right internal carotid artery is occluded.  Reconstitution of flow right carotid terminus region.  Calcified left internal carotid artery cavernous segment with moderate to slightly marked narrowing.  Mild narrowing and irregularity of the A1 segment and M1 segment of the anterior cerebral artery and middle cerebral artery bilaterally respectively.  Small slightly irregular left vertebral artery.  Dominant right vertebral artery with mild narrowing and irregularity.  Attenuated irregular basilar artery.  No aneurysm noted.   Review of the MIP images confirms the above findings.  IMPRESSION: Right internal carotid artery is occluded.  Reconstitution of flow right carotid terminus region.  Intracranial atherosclerotic type changes otherwise as detailed above.  Acute small posterior right lenticular nucleus infarct. Subtle haziness of gray-white differentiation right  hemisphere may reflect early acute larger right hemispheric infarct.   Fuller Canada, M.D.   2D Echocardiogram 08/19/2011 EF 55-60% with no source of embolus. Probable hypokinesis of the apical myocardium. Mitral valve: Mild to at worst moderate regurgitation  CXR  08/19/2011 Marked enlargement of the cardiopericardial silhouette. No acute abnormality.  08/19/2011 Cardiomegaly without failure. No acute abnormality or interval change. 08/18/2011 : No acute findings.   Therapies:  PT:CIR recommended [id number] indicated mainly due to decreased sensation and weaknes. +s/s of aspiration noted s/p swallow of trial ice chips and thin water by spoon and cup due to marked delay in initiation. Solid Po trials not attempted secondary to patient's dentures not available and due to oral dysphagia . Recommend to initiate conservative diet of dysphagia 1 (puree) and nectar thick liquids with full supervision with all meals as aspiration risk remains. ST to follow for diet tolerance and possible diet advancement.    Assessment: 78 yo aaf with right brain CVA which presented as acute left facial droop, left sided weakness and slurred speech while an IP at [facility] for UTI. Specialists on Call were consulted and patient was administered tPA, 08/19/2011 at 1933 per RN report. On aspirin 81 mg orally every day prior to admission. aspirin 325 mg orally every day ordered daily on admission. Patient with resultant left hemiparesis and dysphagia.   Has progressive mild leukocytosis despite IV rocephin and congested upper airway sounds.  Concern exists for aspiration.    -diabetes, uncontrolleld, HgbA1c 8.2  -LDL 70  -CAD  -Pacemaker  -Asthma  -UTI, > 100k GNR, final report pending, on Rocephin IV D#2  -anemia, Hgb 9.2  -leukocytosis, Wbc 11.2  Plan: 1. CXR 2 view due to cough, leukocytosis 2. Continue with aspiration precautions and dysphagia diet/nectar.  SLP to re-eval 3. Check CBC w/diff and BMP 4. Maintain SBP  <180 5. CIR admission when bed avail and medically able. 6. D/C foley. Monitor for retention. 7. Increase lantus to 10 units and resume home dose pre-meal insulin.  Watch CBGs.   LOS: 4 days   Marya Fossa PA-C [facility] [phone] 08/22/2011  9:49 AM  I have examined this patient, reviewed chart, developed plan of care and agree with above  Delia Heady, MD Medical Director [facility] Pager: [phone] 08/22/2011 12:12 PM

## 2011-08-22 NOTE — Progress Notes (Signed)
Speech Language Pathology Dysphagia Treatment Patient Details Name: Cindy Abbott MRN: [id number] DOB: 23-Jun-1922 Today's Date: 08/22/2011 Time: 9562-1308 SLP Time Calculation (min): 15 min  Assessment / Plan / Recommendation Clinical Impression  Purpose of diagnostic treatment follow up from initial BSE for diet tolerance of dysphagia 1 diet consistency  and nectar thick liquids.  Patient observed during evening meal .  Improvement noted in oral phase with increased oral awareness and in area of initiation.   Min to moderate throat clears noted s/p swallow of puree.  Max verbal cues required for patient to complete swallow strategy of double swallow each bite.  Strategy effective in eliminating throat clears.   No coughing or vocal changes noted  with current diet however patient with increased WOB.  Recommend to continue current diet with full supervision.  Recommend to  proceed with objective  assessment of MBS to assess risk for aspiration  prior to diet advancement due to s/s present during treatment.      Diet Recommendation  Continue with Current Diet: Dysphagia 1 (puree) Initiate / Change Diet: Nectar-thick liquid    SLP Plan MBS;New goals to be determined pending objective testing      Swallowing Goals  SLP Swallowing Goals Swallow Study Goal #1 - Progress: Progressing toward goal Swallow Study Goal #2 - Progress: Progressing toward goal  General Temperature Spikes Noted: No Respiratory Status: Room air Behavior/Cognition: Alert;Cooperative;Pleasant mood Oral Cavity - Dentition: Edentulous Patient Positioning: Upright in chair  Oral Cavity - Oral Hygiene Does patient have any of the following "at risk" factors?: Diet - patient on thickened liquids;Other - dysphagia Patient is AT RISK - Oral Care Protocol followed (see row info): Yes   Dysphagia Treatment Treatment focused on: Skilled observation of diet tolerance Treatment Methods/Modalities: Skilled observation;Differential  diagnosis Patient observed directly with PO's: Yes Type of PO's observed: Dysphagia 1 (puree);Nectar-thick liquids Feeding: Able to feed self Liquids provided via: Cup Pharyngeal Phase Signs & Symptoms: Suspected delayed swallow initiation Type of cueing: Verbal Amount of cueing: Maximal   Moreen Fowler MS, CCC-SLP [phone]     [facility] 08/22/2011, 6:26 PM

## 2011-08-23 ENCOUNTER — Inpatient Hospital Stay (HOSPITAL_COMMUNITY): Payer: Medicare Other

## 2011-08-23 ENCOUNTER — Inpatient Hospital Stay (HOSPITAL_COMMUNITY)
Admission: RE | Admit: 2011-08-23 | Discharge: 2011-09-14 | DRG: 945 | Disposition: A | Discharge status: Home Health | Payer: Medicare Other | Source: Ambulatory Visit | Attending: Physical Medicine & Rehabilitation | Admitting: Physical Medicine & Rehabilitation

## 2011-08-23 DIAGNOSIS — Z9861 Coronary angioplasty status: Secondary | ICD-10-CM

## 2011-08-23 DIAGNOSIS — Z860100 Personal history of colon polyps, unspecified: Secondary | ICD-10-CM

## 2011-08-23 DIAGNOSIS — I639 Cerebral infarction, unspecified: Secondary | ICD-10-CM

## 2011-08-23 DIAGNOSIS — N390 Urinary tract infection, site not specified: Secondary | ICD-10-CM | POA: Diagnosis present

## 2011-08-23 DIAGNOSIS — Z951 Presence of aortocoronary bypass graft: Secondary | ICD-10-CM

## 2011-08-23 DIAGNOSIS — B373 Candidiasis of vulva and vagina: Secondary | ICD-10-CM | POA: Diagnosis not present

## 2011-08-23 DIAGNOSIS — I6330 Cerebral infarction due to thrombosis of unspecified cerebral artery: Secondary | ICD-10-CM | POA: Diagnosis present

## 2011-08-23 DIAGNOSIS — Z950 Presence of cardiac pacemaker: Secondary | ICD-10-CM

## 2011-08-23 DIAGNOSIS — B3731 Acute candidiasis of vulva and vagina: Secondary | ICD-10-CM | POA: Diagnosis not present

## 2011-08-23 DIAGNOSIS — Z5189 Encounter for other specified aftercare: Principal | ICD-10-CM

## 2011-08-23 DIAGNOSIS — R1310 Dysphagia, unspecified: Secondary | ICD-10-CM | POA: Diagnosis present

## 2011-08-23 DIAGNOSIS — D649 Anemia, unspecified: Secondary | ICD-10-CM | POA: Diagnosis present

## 2011-08-23 DIAGNOSIS — J45909 Unspecified asthma, uncomplicated: Secondary | ICD-10-CM | POA: Diagnosis present

## 2011-08-23 DIAGNOSIS — A498 Other bacterial infections of unspecified site: Secondary | ICD-10-CM | POA: Diagnosis present

## 2011-08-23 DIAGNOSIS — E119 Type 2 diabetes mellitus without complications: Secondary | ICD-10-CM | POA: Diagnosis present

## 2011-08-23 DIAGNOSIS — I10 Essential (primary) hypertension: Secondary | ICD-10-CM | POA: Diagnosis present

## 2011-08-23 DIAGNOSIS — Z9282 Status post administration of tPA (rtPA) in a different facility within the last 24 hours prior to admission to current facility: Secondary | ICD-10-CM

## 2011-08-23 DIAGNOSIS — G8190 Hemiplegia, unspecified affecting unspecified side: Secondary | ICD-10-CM | POA: Diagnosis present

## 2011-08-23 DIAGNOSIS — I2510 Atherosclerotic heart disease of native coronary artery without angina pectoris: Secondary | ICD-10-CM | POA: Diagnosis present

## 2011-08-23 LAB — CBC WITH DIFFERENTIAL/PLATELET
Basophils Absolute: 0 10*3/uL (ref 0.0–0.1)
Eosinophils Absolute: 0.1 10*3/uL (ref 0.0–0.7)
Lymphocytes Relative: 12 % (ref 12–46)
Lymphs Abs: 1.1 10*3/uL (ref 0.7–4.0)
Neutrophils Relative %: 79 % — ABNORMAL HIGH (ref 43–77)
Platelets: 236 10*3/uL (ref 150–400)
RBC: 3.49 MIL/uL — ABNORMAL LOW (ref 3.87–5.11)
RDW: 17.8 % — ABNORMAL HIGH (ref 11.5–15.5)
WBC: 9 10*3/uL (ref 4.0–10.5)

## 2011-08-23 LAB — BASIC METABOLIC PANEL
Calcium: 8.6 mg/dL (ref 8.4–10.5)
GFR calc non Af Amer: 41 mL/min — ABNORMAL LOW (ref >90)
Sodium: 143 mEq/L (ref 135–145)

## 2011-08-23 LAB — GLUCOSE, CAPILLARY: Glucose-Capillary: 258 mg/dL — ABNORMAL HIGH (ref 70–99)

## 2011-08-23 MED ORDER — SORBITOL 70 % SOLN
30.0000 mL | Freq: Every day | Status: DC | PRN
  Administered 2011-08-29 – 2011-09-05 (×2): 30 mL via ORAL
  Filled 2011-08-23 (×3): qty 30

## 2011-08-23 MED ORDER — BIOTENE DRY MOUTH MT LIQD
15.0000 mL | Freq: Four times a day (QID) | OROMUCOSAL | Status: DC
  Administered 2011-08-23 – 2011-09-14 (×77): 15 mL via OROMUCOSAL
  Filled 2011-08-23: qty 15

## 2011-08-23 MED ORDER — POTASSIUM CHLORIDE CRYS ER 20 MEQ PO TBCR
20.0000 meq | EXTENDED_RELEASE_TABLET | Freq: Two times a day (BID) | ORAL | Status: DC
  Administered 2011-08-23: 20 meq via ORAL
  Filled 2011-08-23 (×4): qty 1

## 2011-08-23 MED ORDER — ACETAMINOPHEN 325 MG PO TABS
325.0000 mg | ORAL_TABLET | ORAL | Status: DC | PRN
  Filled 2011-08-23: qty 2

## 2011-08-23 MED ORDER — INSULIN ASPART 100 UNIT/ML ~~LOC~~ SOLN
5.0000 [IU] | Freq: Every day | SUBCUTANEOUS | Status: DC
  Administered 2011-08-24 – 2011-09-04 (×11): 5 [IU] via SUBCUTANEOUS
  Administered 2011-09-05: 09:00:00 via SUBCUTANEOUS
  Administered 2011-09-06 – 2011-09-14 (×9): 5 [IU] via SUBCUTANEOUS

## 2011-08-23 MED ORDER — SULFAMETHOXAZOLE-TMP DS 800-160 MG PO TABS
1.0000 | ORAL_TABLET | Freq: Two times a day (BID) | ORAL | Status: DC
  Administered 2011-08-23: 1 via ORAL
  Filled 2011-08-23 (×2): qty 1

## 2011-08-23 MED ORDER — PNEUMOCOCCAL VAC POLYVALENT 25 MCG/0.5ML IJ INJ: 0.5000 mL | INJECTION | INTRAMUSCULAR | Status: DC

## 2011-08-23 MED ORDER — LATANOPROST 0.005 % OP SOLN
1.0000 [drp] | Freq: Every day | OPHTHALMIC | Status: DC
  Administered 2011-08-23 – 2011-09-13 (×22): 1 [drp] via OPHTHALMIC
  Filled 2011-08-23 (×2): qty 2.5

## 2011-08-23 MED ORDER — ONDANSETRON HCL 4 MG PO TABS: 4.0000 mg | ORAL_TABLET | Freq: Four times a day (QID) | ORAL | Status: DC | PRN

## 2011-08-23 MED ORDER — CLONIDINE HCL 0.2 MG/24HR TD PTWK: 1.0000 | MEDICATED_PATCH | TRANSDERMAL | Status: DC

## 2011-08-23 MED ORDER — SULFAMETHOXAZOLE-TMP DS 800-160 MG PO TABS: 1.0000 | ORAL_TABLET | Freq: Two times a day (BID) | ORAL | Status: AC

## 2011-08-23 MED ORDER — ONDANSETRON HCL 4 MG/2ML IJ SOLN
4.0000 mg | Freq: Four times a day (QID) | INTRAMUSCULAR | Status: DC | PRN
  Filled 2011-08-23: qty 2

## 2011-08-23 MED ORDER — INSULIN GLARGINE 100 UNIT/ML ~~LOC~~ SOLN
10.0000 [IU] | Freq: Every day | SUBCUTANEOUS | Status: DC
  Administered 2011-08-23 – 2011-09-13 (×22): 10 [IU] via SUBCUTANEOUS

## 2011-08-23 MED ORDER — IPRATROPIUM BROMIDE 0.02 % IN SOLN
0.5000 mg | Freq: Two times a day (BID) | RESPIRATORY_TRACT | Status: DC
  Administered 2011-08-24 – 2011-09-14 (×36): 0.5 mg via RESPIRATORY_TRACT
  Filled 2011-08-23 (×45): qty 2.5

## 2011-08-23 MED ORDER — ISOSORBIDE MONONITRATE ER 30 MG PO TB24
30.0000 mg | ORAL_TABLET | Freq: Every day | ORAL | Status: DC
  Administered 2011-08-24 – 2011-09-14 (×22): 30 mg via ORAL
  Filled 2011-08-23 (×25): qty 1

## 2011-08-23 MED ORDER — ALBUTEROL SULFATE (5 MG/ML) 0.5% IN NEBU
2.5000 mg | INHALATION_SOLUTION | Freq: Two times a day (BID) | RESPIRATORY_TRACT | Status: DC
  Administered 2011-08-24 – 2011-09-14 (×36): 2.5 mg via RESPIRATORY_TRACT
  Filled 2011-08-23 (×44): qty 0.5

## 2011-08-23 MED ORDER — SULFAMETHOXAZOLE-TMP DS 800-160 MG PO TABS
1.0000 | ORAL_TABLET | Freq: Two times a day (BID) | ORAL | Status: DC
  Administered 2011-08-23 – 2011-08-30 (×14): 1 via ORAL
  Filled 2011-08-23 (×17): qty 1

## 2011-08-23 MED ORDER — ASPIRIN 325 MG PO TABS
325.0000 mg | ORAL_TABLET | Freq: Every day | ORAL | Status: DC
  Administered 2011-08-24 – 2011-09-08 (×16): 325 mg via ORAL
  Filled 2011-08-23 (×18): qty 1

## 2011-08-23 MED ORDER — PANTOPRAZOLE SODIUM 40 MG PO TBEC
40.0000 mg | DELAYED_RELEASE_TABLET | Freq: Every day | ORAL | Status: DC
  Administered 2011-08-24 – 2011-09-14 (×22): 40 mg via ORAL
  Filled 2011-08-23 (×25): qty 1

## 2011-08-23 MED ORDER — CLONIDINE HCL 0.2 MG/24HR TD PTWK
0.2000 mg | MEDICATED_PATCH | TRANSDERMAL | Status: DC
  Administered 2011-08-24 – 2011-09-14 (×4): 0.2 mg via TRANSDERMAL
  Filled 2011-08-23 (×6): qty 1

## 2011-08-23 MED ORDER — METOPROLOL TARTRATE 25 MG PO TABS
25.0000 mg | ORAL_TABLET | Freq: Two times a day (BID) | ORAL | Status: DC
  Administered 2011-08-23 – 2011-09-01 (×18): 25 mg via ORAL
  Filled 2011-08-23 (×20): qty 1

## 2011-08-23 MED ORDER — POLYETHYLENE GLYCOL 3350 17 G PO PACK
17.0000 g | PACK | Freq: Every day | ORAL | Status: DC | PRN
  Filled 2011-08-23 (×2): qty 1

## 2011-08-23 MED ORDER — INSULIN ASPART 100 UNIT/ML ~~LOC~~ SOLN
10.0000 [IU] | Freq: Two times a day (BID) | SUBCUTANEOUS | Status: DC
  Administered 2011-08-25 – 2011-08-26 (×2): 10 [IU] via SUBCUTANEOUS
  Administered 2011-08-27 – 2011-08-30 (×7): 5 [IU] via SUBCUTANEOUS
  Administered 2011-08-31: 10 [IU] via SUBCUTANEOUS
  Administered 2011-09-01: 5 [IU] via SUBCUTANEOUS
  Administered 2011-09-01 – 2011-09-03 (×4): 10 [IU] via SUBCUTANEOUS
  Administered 2011-09-03: 5 [IU] via SUBCUTANEOUS
  Administered 2011-09-04 (×2): via SUBCUTANEOUS
  Administered 2011-09-05: 10 [IU] via SUBCUTANEOUS
  Administered 2011-09-06: 5 [IU] via SUBCUTANEOUS
  Administered 2011-09-07: 10 [IU] via SUBCUTANEOUS

## 2011-08-23 NOTE — Progress Notes (Signed)
Pt had  4 runs of V-Tach at 0209,denies any discomfort on assessment,Dr Renolds(on call) paged and notified,said to continue monitoring,pt reassured,family at bedside. [name], Cindy Abbott

## 2011-08-23 NOTE — Plan of Care (Signed)
Overall Plan of Care [facility]) Patient Details Name: Cindy Abbott MRN: [id number] DOB: 1922-12-06  Diagnosis:  Thrombotic infarct  Primary Diagnosis:    CVA (cerebral infarction) Co-morbidities: dm, htn, uti, cad  Functional Problem List  Patient demonstrates impairments in the following areas: Balance, Bladder, Bowel, Cognition, Endurance, Motor, Perception and Safety cognition , bowel and bladder   Basic ADL's: eating, grooming, bathing, dressing and toileting Advanced ADL's: simple meal preparation  Transfers:  bed mobility, bed to chair, toilet, tub/shower and car Locomotion:  ambulation, wheelchair mobility and stairs  Additional Impairments:  Swallowing, Communication  comprehension and expression and Social Cognition   social interaction, problem solving, memory, attention and awareness  Anticipated Outcomes Item Anticipated Outcome  Eating/Swallowing  Min assist   Basic self-care  Min assist  Tolieting  Min assist  Bowel/Bladder  Min assist   Transfers  Min A  Locomotion  Min A  Communication    Cognition    Pain    Safety/Judgment  Mod independence  Other     Therapy Plan: PT Frequency: 1-2 X/day, 60-90 minutes;5 out of 7 days OT Frequency: 1-2 X/day, 60-90 minutes SLP Frequency: 1-2 X/day, 30-60 minutes;5 out of 7 days   Team Interventions: Item RN PT OT SLP SW TR Other  Self Care/Advanced ADL Retraining   x      Neuromuscular Re-Education  x x      Therapeutic Activities  x x x     UE/LE Strength Training/ROM  x x      UE/LE Coordination Activities  x x      Visual/Perceptual Remediation/Compensation  x x      DME/Adaptive Equipment Instruction  x x      Therapeutic Exercise  x x      Balance/Vestibular Training  x x      Patient/Family Education x x x x     Cognitive Remediation/Compensation  x x x     Functional Mobility Training  x x      Ambulation/Gait Training  x       Astronomer x   x     Speech/Language Facilitation    x     Bladder Management x        Bowel Management x        Disease Management/Prevention x        Pain Management         Medication Management x        Skin Care/Wound Management x        Splinting/Orthotics  x       Discharge Planning  x x      Psychosocial Support   x                         Team Discharge Planning: Destination:  Home Projected Follow-up:  PT, OT and SLP Home Health Projected Equipment Needs:  Bedside Commode, Tub Bench, Walker, Wheelchair and TBD once consult with family Patient/family involved in discharge planning:  No family available and patient unable  MD ELOS: 3-4 weeks Medical Rehab Prognosis:  Excellent Assessment: pt is admitted for CIR therapies. The team will be addressing self- care, NMR, fxnl mobility, safety, cognition,  and communcation, swallowing. Goals are set at minimal assistance

## 2011-08-23 NOTE — Procedures (Signed)
Objective Swallowing Evaluation: Modified Barium Swallowing Study  Patient Details  Name: Cindy Abbott MRN: [id number] Date of Birth: 21-Feb-1922  Today's Date: 08/23/2011 Time: 1135-1200 SLP Time Calculation (min): 25 min  Past Medical History:  Past Medical History  Diagnosis Date  . Coronary artery disease   . Diabetes mellitus   . Pacemaker   . Asthma   . Stroke, acute, thrombotic 08/19/2011   Past Surgical History:  Past Surgical History  Procedure Date  . Cardiac surgery   . Coronary angioplasty with stent placement   . Coronary artery bypass graft   . Abdominal hysterectomy   . Colon polpys    HPI:  78 y/o female admitted to [facility] on 08/18/11 with UTI.  Patient developed stroke like symptoms and transferred to [facility] for further management.  Patient received tPA. CT of head indicates acute small posterior Right lenticular nucleus  infarct.  [id number] on 08/22/11: cardiomegaly and small bilateral pleural effusions.  No focal consolidations.    Patient referred for objective assessment of of MBS to assess risk for aspiration and recommend safest, PO diet with necessary swallow strategies.        Assessment / Plan / Recommendation Clinical Impression  Dysphagia Diagnosis: Moderate oral phase dysphagia;Mild pharyngeal phase dysphagia;Moderate cervical esophageal phase dysphagia Clinical impression: Moderate oral sensory motor dysphagia marked by decreased bolus cohesion and anterior and posterior transit .  Mild sensory/anatomical pharyngeal dsyphagia.  Decreased TBR with piecemeal swallows with all consistencies.  Natural curvature of cervical spine noted to impinge posterior pharyngeal wall at C-4 to C-6 slowing bolus transit.  Flash penetration x2 with thin liquid by cup and straw due to decreased airway closure.  Backflow with alll trials observed to C-5 s/p swallow .  Multiple dry swallows required to clear residue .  No instance of aspiration noted during evaluation however patient is  at risk for aspiration after the swallow secondary to backflow and residuals . Pharyngeal phase not viewed under fluoro with mechanical soft trial as it took  > two minutes for patient to masticate mechanical soft trial.   Patient  indicated she wanted to spit out bolus but swallowed bolus when SLP put cup up to her mouth.  Brief esophageal sweep indicates slow bolus transit with backflow to cervical esophagus.   Strategies effective in preventing penetration/aspiration and clearing residuals multiple swallows and chin tuck.  Recommend to continue dysphagia 1 diet consistency and advance to thin liquids with full supervision with all meals to ensure patient completes strategies to increase safety.  No repeat MBS warranted at this time. Defer diet upgrade to treating SLP at next level of care.      Treatment Recommendation  Therapy as outlined in treatment plan below    Diet Recommendation Dysphagia 1 (Puree);Thin liquid   Liquid Administration via: Cup;Straw Medication Administration: Whole meds with liquid Supervision: Patient able to self feed;Full supervision/cueing for compensatory strategies Compensations: Slow rate;Small sips/bites;Multiple dry swallows after each bite/sip;Clear throat intermittently Postural Changes and/or Swallow Maneuvers: Out of bed for meals;Seated upright 90 degrees;Upright 30-60 min after meal;Chin tuck    Other  Recommendations Oral Care Recommendations: Oral care QID   Follow Up Recommendations  Inpatient Rehab    Frequency and Duration min 2x/week  2 weeks            General Date of Onset: 08/19/11 HPI: 78 y/o female admitted to [facility] on 08/18/11 with UTI.  Patient developed stroke like symptoms and transferred to [facility] for further management.  Patient  received tPA. CT of head indicates acute small posterior Right lenticular nucleus infarct.  [id number] on 08/22/11: cardiomegaly and small bilateral pleural effusions.  No focal consolidations.    Patient referred  for objective assessment of of MBS to assess risk for aspiration and recommend safest, PO diet with necessary swallow strategies.    Type of Study: Modified Barium Swallowing Study Reason for Referral: Objectively evaluate swallowing function Previous Swallow Assessment: BSE complete on 08/20/11 Diet Prior to this Study: Dysphagia 1 (puree);Nectar-thick liquids Temperature Spikes Noted: No Respiratory Status: Room air History of Recent Intubation: No Behavior/Cognition: Alert;Cooperative;Pleasant mood;Confused;Requires cueing;Distractible Oral Cavity - Dentition: Edentulous Oral Motor / Sensory Function: Impaired - see Bedside swallow eval Self-Feeding Abilities: Needs assist Patient Positioning: Upright in chair Baseline Vocal Quality: Clear;Low vocal intensity Volitional Cough: Strong Volitional Swallow: Able to elicit Anatomy: Within functional limits Pharyngeal Secretions: Not observed secondary MBS    Reason for Referral Objectively evaluate swallowing function   Oral Phase   Oral Phase moderately impaired   Pharyngeal Phase Pharyngeal Phase: Impaired   Cervical Esophageal Phase    GO Moreen Fowler MS, CCC-SLP   Cervical Esophageal Phase: Impaired   [phone] [facility] 08/23/2011, 12:56 PM

## 2011-08-23 NOTE — H&P (Signed)
Physical Medicine and Rehabilitation Admission H&P  Chief Complaint   Patient presents with   .  Shortness of Breath   :  HPI:Cindy Abbott is a 78 y.o. right-handed female with history of coronary artery disease with pacemaker, diabetes mellitus. Admitted 08/19/2011 from [facility] with acute onset of left-sided weakness slurred speech. Initial cranial CT scan showed age-related atrophy without acute change. Patient did receive TPA. Full workup is ongoing per neurology services. Carotid Dopplers with bilateral 40-59% ICA stenosis. Echocardiogram with ejection fraction of 70% without embolus. CTA of the head with right internal carotid artery occlusion. Neurology services consulted placed on aspirin therapy 325 mg daily. A urine study was completed showing Escherichia coli UTI and placed on Rocephin 08/19/2011 and changed to Bactrim 08/23/2011. Hemoglobin A1c of 8.2 with insulin therapy as directed. Bedside swallowing evaluation placed on dysphagia 1 pured nectar thick liquid diet with formal swallow study completed 08/23/2011 advised to continue pured 1 consistency but can advance to thin liquids. Physical occupational therapy ongoing with recommendations of physical medicine rehabilitation consult to consider inpatient rehabilitation services. Patient felt to be a good candidate for inpatient rehabilitation services and was admitted for comprehensive rehabilitation program  Review of Systems  Cardiovascular: Positive for palpitations.  Gastrointestinal: Positive for constipation.  Musculoskeletal: Positive for myalgias.  Neurological: Positive for dizziness.  All other systems reviewed and are negative  Past Medical History   Diagnosis  Date   .  Coronary artery disease    .  Diabetes mellitus    .  Pacemaker    .  Asthma    .  Stroke, acute, thrombotic  08/19/2011    Past Surgical History   Procedure  Date   .  Cardiac surgery    .  Coronary angioplasty with stent placement    .   Coronary artery bypass graft    .  Abdominal hysterectomy    .  Colon polpys     History reviewed. No pertinent family history.  Social History: reports that she has never smoked. She does not have any smokeless tobacco history on file. She reports that she does not drink alcohol or use illicit drugs.  Allergies:  Allergies   Allergen  Reactions   .  Lisinopril     Medications Prior to Admission   Medication  Sig  Dispense  Refill   .  albuterol (PROVENTIL) (5 MG/ML) 0.5% nebulizer solution  Take 2.5 mg by nebulization every 6 (six) hours as needed.     Marland Kitchen  aspirin EC 81 MG tablet  Take 81 mg by mouth every morning.     .  bumetanide (BUMEX) 1 MG tablet  Take 1 mg by mouth daily. *Only take if fluid retention is current. Provided via son discretion. Patient is to take Klor-Con with this medication*     .  cloNIDine (CATAPRES - DOSED IN MG/24 HR) 0.2 mg/24hr patch  Place 1 patch onto the skin once a week.     .  insulin lispro (HUMALOG) 100 UNIT/ML injection  Inject 5-10 Units into the skin 3 (three) times daily before meals. *Inject 5 units in the morning and 10 units at lunch and at dinner*     .  isosorbide mononitrate (IMDUR) 30 MG 24 hr tablet  Take 30 mg by mouth daily.     Marland Kitchen  latanoprost (XALATAN) 0.005 % ophthalmic solution  Place 1 drop into both eyes at bedtime.     .  metoprolol (LOPRESSOR)  50 MG tablet  Take 50 mg by mouth 2 (two) times daily.     .  potassium chloride SA (K-DUR,KLOR-CON) 20 MEQ tablet  Take 20 mEq by mouth daily. *To be taken when Bumex is taken for fluid retention*      Home:  Home Living  Lives With: Son  Available Help at Discharge: Family  Type of Home: House  Home Access: Stairs to enter  Secretary/administrator of Steps: 3  Entrance Stairs-Rails: Right  Home Layout: One level  Bathroom Shower/Tub: Medical sales representative: Standard  Bathroom Accessibility: Yes  Home Adaptive Equipment: Walker - rolling;Other (comment) (has RW but does  not use it normally)  Additional Comments: ALL information obtained from PT evaluation on [id number] at [facility]- Per chart review son reports pt normally only walks ~15 yds due to COPD  Functional History:  Prior Function  Able to Take Stairs?: Yes  Driving: No  Vocation: Retired  Functional Status:  Mobility:  Bed Mobility  Bed Mobility: Rolling Left;Left Sidelying to Sit;Scooting to [facility];Sit to Supine  Rolling Left: 1: +2 Total assist;With rail  Rolling Left: Patient Percentage: 40%  Left Sidelying to Sit: 1: +2 Total assist;With rails  Left Sidelying to Sit: Patient Percentage: 40%  Supine to Sit: 1: +2 Total assist;HOB elevated  Supine to Sit: Patient Percentage: 10%  Sitting - Scoot to Edge of Bed: 1: +2 Total assist  Sitting - Scoot to Edge of Bed: Patient Percentage: 40%  Sit to Supine: 1: +2 Total assist;HOB flat  Sit to Supine: Patient Percentage: 40%  Scooting to HOB: 1: +2 Total assist  Scooting to [facility]: Patient Percentage: 0%  Transfers  Transfers: Sit to Stand;Stand to Sit;Stand Pivot Transfers  Sit to Stand: With upper extremity assist;1: +2 Total assist;From bed  Sit to Stand: Patient Percentage: 30%  Stand to Sit: 1: +2 Total assist;To chair/3-in-1;With armrests  Stand to Sit: Patient Percentage: 30%  Ambulation/Gait  Ambulation/Gait Assistance: Not tested (comment)  Ambulation Distance (Feet): 15 Feet  Assistive device: Rolling walker  Ambulation/Gait Assistance Details: Pt ambulates slowly with RW; attempted ambulation without RW but pt was to unsteady  Gait velocity: slow  Stairs: No  Wheelchair Mobility  Wheelchair Mobility: No  ADL:  ADL  Eating/Feeding: Other (comment) (daughter reported feeding pt breakfast)  Where Assessed - Eating/Feeding: Bed level  Grooming: Simulated;Wash/dry hands;Wash/dry face;Moderate assistance  Where Assessed - Grooming: Supported sitting  Upper Body Dressing: Performed;Maximal assistance  Where Assessed - Upper Body Dressing:  Supported sitting  Toilet Transfer: Simulated;+2 Total assistance  Toilet Transfer Method: Archivist: Raised toilet seat with arms (or 3-in-1 over toilet)  Equipment Used: Gait belt  Transfers/Ambulation Related to ADLs: pt unable to perform at this time  ADL Comments: Improvement noted in L side regard, both visually and physically. Instructed family to approach pt to L side and to encourage her participation in feeding rather than performing for her. Also instructed pt and familty in positioning L UE and protecting it from injury. Majority of therapy session focusing on proximal stability in sitting with trunk activities and WB on L UE.  Cognition:  Cognition  Overall Cognitive Status: Impaired  Arousal/Alertness: Awake/alert  Orientation Level: Oriented to person;Oriented to place;Oriented to situation  Attention: Focused;Sustained  Focused Attention: Appears intact  Sustained Attention: Impaired  Sustained Attention Impairment: Verbal basic;Functional basic  Memory: Impaired  Memory Impairment: Storage deficit;Decreased recall of new information;Decreased short term memory  Decreased Short Term  Memory: Verbal basic;Functional basic  Awareness: Impaired  Awareness Impairment: Intellectual impairment;Emergent impairment;Anticipatory impairment  Problem Solving: Impaired  Problem Solving Impairment: Verbal basic;Functional basic  Executive Function: Sequencing;Organizing;Decision Making;Self Monitoring;Self Correcting  Sequencing: Impaired  Sequencing Impairment: Verbal basic;Functional basic  Organizing: Impaired  Organizing Impairment: Verbal basic;Functional basic  Decision Making: Impaired  Decision Making Impairment: Verbal basic;Functional basic  Self Monitoring: Impaired  Self Monitoring Impairment: Verbal basic;Functional basic  Self Correcting: Impaired  Self Correcting Impairment: Verbal basic;Functional basic  Behaviors: Perseveration    Safety/Judgment: Impaired  Cognition  Overall Cognitive Status: Impaired  Area of Impairment: Memory;Following commands;Safety/judgement;Awareness of errors;Problem solving  Arousal/Alertness: Awake/alert  Orientation Level: Disoriented to;Place;Situation;Time  Behavior During Session: [facility] for tasks performed  Memory Deficits: Pt able to name her 2 children in the room. Disoriented to place, time and situation.  Following Commands: Follows one step commands consistently  Safety/Judgement: Decreased awareness of safety precautions;Decreased safety judgement for tasks assessed  Awareness of Errors: Assistance required to identify errors made  Problem Solving: Pt asked "should you try to walk by yourself" pt said "i am not sure about that" which was a good response. However pt requesting only one blanket so that "its not too heavy so I walk"  Blood pressure 182/55, pulse 60, temperature 98.8 F (37.1 C), temperature source Oral, resp. rate 16, height 5\' 2"  (1.575 m), weight 70.5 kg (155 lb 6.8 oz), SpO2 96.00%.  Physical Exam  HENT:  Left facial droop  Eyes:  Pupils reactive to light  Neck: Neck supple. No thyromegaly present.  Cardiovascular: Normal rate and regular rhythm.  Pulmonary/Chest: Decreased breath sounds at the bases. No respiratory distress. She has no wheezes.  Abdominal: Bowel sounds are normal. She exhibits no distension. There is no tenderness.  Neurological: She is alert.  Patient could give her appropriate age date of birth and place. She does show some left-sided neglect as well as dysarthric speech but intelligible. She is slow to initiate on the left and her processing is delayed as a whole.  She would follow basic commands. left upper extremity strength is 3-4/5. Left lower ext strength 2  /5 in the hip extensor otherwise2-3/5  Right-sided strength is 4 minus/5 in the right deltoid, biceps, triceps, grip, hip flexor, knee extensor ankle dorsiflexor  Sensation is intact  to light touch in bilateral upper and lower extremities  There is a right gaze preference. She can be cue to look toward the left.  There is a left central 7   Skin: Skin is warm and dry.   Results for orders placed during the hospital encounter of 08/18/11 (from the past 48 hour(s))   GLUCOSE, CAPILLARY Status: Abnormal    Collection Time    08/21/11 4:24 PM   Component  Value  Range  Comment    Glucose-Capillary  255 (*)  70 - 99 mg/dL    GLUCOSE, CAPILLARY Status: Abnormal    Collection Time    08/21/11 9:51 PM   Component  Value  Range  Comment    Glucose-Capillary  250 (*)  70 - 99 mg/dL    GLUCOSE, CAPILLARY Status: Abnormal    Collection Time    08/22/11 6:59 AM   Component  Value  Range  Comment    Glucose-Capillary  180 (*)  70 - 99 mg/dL    GLUCOSE, CAPILLARY Status: Abnormal    Collection Time    08/22/11 11:32 AM   Component  Value  Range  Comment    Glucose-Capillary  185 (*)  70 - 99 mg/dL     Comment 1  Documented in Chart      Comment 2  Notify RN     GLUCOSE, CAPILLARY Status: Abnormal    Collection Time    08/22/11 4:56 PM   Component  Value  Range  Comment    Glucose-Capillary  140 (*)  70 - 99 mg/dL     Comment 1  Notify RN      Comment 2  Documented in Chart     GLUCOSE, CAPILLARY Status: Abnormal    Collection Time    08/22/11 10:33 PM   Component  Value  Range  Comment    Glucose-Capillary  195 (*)  70 - 99 mg/dL    GLUCOSE, CAPILLARY Status: Abnormal    Collection Time    08/23/11 2:16 AM   Component  Value  Range  Comment    Glucose-Capillary  258 (*)  70 - 99 mg/dL    BASIC METABOLIC PANEL Status: Abnormal    Collection Time    08/23/11 5:52 AM   Component  Value  Range  Comment    Sodium  143  135 - 145 mEq/L     Potassium  4.9  3.5 - 5.1 mEq/L     Chloride  114 (*)  96 - 112 mEq/L     CO2  23  19 - 32 mEq/L     Glucose, Bld  218 (*)  70 - 99 mg/dL     BUN  14  6 - 23 mg/dL     Creatinine, Ser  1.61 (*)  0.50 - 1.10 mg/dL     Calcium  8.6   8.4 - 10.5 mg/dL     GFR calc non Af Amer  41 (*)  >90 mL/min     GFR calc Af Amer  47 (*)  >90 mL/min    CBC WITH DIFFERENTIAL Status: Abnormal    Collection Time    08/23/11 5:52 AM   Component  Value  Range  Comment    WBC  9.0  4.0 - 10.5 K/uL     RBC  3.49 (*)  3.87 - 5.11 MIL/uL     Hemoglobin  8.6 (*)  12.0 - 15.0 g/dL     HCT  09.6 (*)  04.5 - 46.0 %     MCV  76.2 (*)  78.0 - 100.0 fL     MCH  24.6 (*)  26.0 - 34.0 pg     MCHC  32.3  30.0 - 36.0 g/dL     RDW  40.9 (*)  81.1 - 15.5 %     Platelets  236  150 - 400 K/uL     Neutrophils Relative  79 (*)  43 - 77 %     Neutro Abs  7.1  1.7 - 7.7 K/uL     Lymphocytes Relative  12  12 - 46 %     Lymphs Abs  1.1  0.7 - 4.0 K/uL     Monocytes Relative  7  3 - 12 %     Monocytes Absolute  0.6  0.1 - 1.0 K/uL     Eosinophils Relative  1  0 - 5 %     Eosinophils Absolute  0.1  0.0 - 0.7 K/uL     Basophils Relative  0  0 - 1 %     Basophils Absolute  0.0  0.0 - 0.1 K/uL    GLUCOSE,  CAPILLARY Status: Abnormal    Collection Time    08/23/11 6:44 AM   Component  Value  Range  Comment    Glucose-Capillary  205 (*)  70 - 99 mg/dL     Dg Chest 2 View  1/47/8295 *RADIOLOGY REPORT* Clinical Data: Leukocytosis, congestion, cough. History diabetes, CAD, CABG, asthma. CHEST - 2 VIEW Comparison: 08/19/2011 Findings: Patient has left-sided transvenous pacemaker with lead to the right ventricle. Patient has had median sternotomy CABG. Heart is enlarged. There are no focal consolidations. There are small bilateral pleural effusions. No overt pulmonary edema. IMPRESSION: 1. Cardiomegaly and small bilateral pleural effusions. 2. No focal consolidations. Original Report Authenticated By: Patterson Hammersmith, M.D.   Post Admission Physician Evaluation:  1. Functional deficits secondary to right fronto-parietal thrombotic infarct. 2. Patient is admitted to receive collaborative, interdisciplinary care between the physiatrist, rehab nursing staff, and  therapy team. 3. Patient's level of medical complexity and substantial therapy needs in context of that medical necessity cannot be provided at a lesser intensity of care such as a SNF. 4. Patient has experienced substantial functional loss from his/her baseline which was documented above under the "Functional History" and "Functional Status" headings. Judging by the patient's diagnosis, physical exam, and functional history, the patient has potential for functional progress which will result in measurable gains while on inpatient rehab. These gains will be of substantial and practical use upon discharge in facilitating mobility and self-care at the household level. 5. Physiatrist will provide 24 hour management of medical needs as well as oversight of the therapy plan/treatment and provide guidance as appropriate regarding the interaction of the two. 6. 24 hour rehab nursing will assist with bladder management, bowel management, safety, skin/wound care, disease management, medication administration, pain management and patient education and help integrate therapy concepts, techniques,education, etc. 7. PT will assess and treat for: lower ext strength, NMR, fxnl mobility and adaptive equipment. Goals are: supervision to minimal assistance. 8. OT will assess and treat for: upper ext strength, ROM, safety NMR, ADL's, adaptive equipment. Goals are: minimal assistance. 9. SLP will assess and treat for: cognition, communication, swallowing. Goals are supervision to moderate assistance. 10. Case Management and Social Worker will assess and treat for psychological issues and discharge planning. 11. Team conference will be held weekly to assess progress toward goals and to determine barriers to discharge. 12. Patient will receive at least 3 hours of therapy per day at least 5 days per week. 13. ELOS: 3 weeks Prognosis: good Medical Problem List and Plan:  1. Right frontal and parietal thrombotic infarct with  left hemiparesthesias  2. DVT Prophylaxis/Anticoagulation: SCDs. Monitor for signs of DVT. Will consider Lovenox therapy  3. Dysphagia. Dysphagia 1 diet with thin liquids as per modified barium swallow 08/23/2011. Monitor for any signs of aspiration. Followup speech therapy. . Monitor hydration while on thickened liquids  4. Neuropsych: This patient is not capable of making decisions on her own behalf.  5. Diabetes mellitus. Hemoglobin A1c of 8.2. Lantus insulin 10 units each bedtime as well as NovoLog 10 units before lunch and supper and 5 units before breakfast  6. Hypertension. Clonidine patch 0.2 mg weekly, M.D. or 30 mg daily, Lopressor 25 mg twice a day. Monitor the increased mobility  7. Escherichia coli UTI. Continue Bactrim  8. CAD status post pacemaker. Patient denies any chest pain. Continue aspirin therapy  Ivory Broad, MD

## 2011-08-23 NOTE — PMR Pre-admission (Signed)
PMR Admission Coordinator Pre-Admission Assessment  Patient: Cindy Abbott is an 78 y.o., female MRN: [id number] DOB: [birth year] Height: 5\' 2"  (157.5 cm) Weight: 70.5 kg (155 lb 6.8 oz)  Insurance Information HMO:     PPO:      PCP:      IPA:      80/20: yes     OTHER: no hmo PRIMARY: Medicare a and b      Policy#: [id number]      Subscriber: pt CM Name:       Phone#:      Fax#:  Pre-Cert#:       Employer:  Benefits:  Phone #: vision share     Name: 08/23/11 Eff. Date: a 09/04/89 b 12/04/89     Deduct: $1184      Out of Pocket Max: none      Life Max: none CIR: 100%      SNF: 20 full days LBD 12/04/06 Outpatient: 80%     Co-Pay: 20% Home Health: 100%      Co-Pay: none DME: 80%     Co-Pay: 20% Providers: pt choice  SECONDARY: none    Medicaid Application Date:       Case Manager:   Emergency Contact Information Contact Information    Name Relation Home Work Mobile   [place] Son [phone]  [phone]     Current Medical History  Patient Admitting Diagnosis: right frontal and parietal infarct   History of Present Illness:Cindy Abbott is a 78 y.o. right-handed female with history of coronary artery disease with pacemaker, diabetes mellitus. Admitted 08/19/2011 from [facility] with UTI. Acute onset of left-sided weakness slurred speech and family notified staff.   Initial cranial CT scan showed age-related atrophy without acute change. Patient did receive TPA. Full workup is ongoing per neurology services. Carotid Dopplers with bilateral 40-59% ICA stenosis. Echocardiogram with ejection fraction of 70%. A urine study was completed showing gram-negative rods and placed on Rocephin. Hemoglobin A1c of 8.2 with insulin therapy as directed.    CT angiogram of the brain demonstrates right ICA occlusion  Total: 4  NIH    Past Medical History  Past Medical History  Diagnosis Date  . Coronary artery disease   . Diabetes mellitus   . Pacemaker   . Asthma   . Stroke, acute,  thrombotic 08/19/2011    Family History  family history is not on file.  Prior Rehab/Hospitalizations: none  Current Medications  Current facility-administered medications:albuterol (PROVENTIL) (5 MG/ML) 0.5% nebulizer solution 2.5 mg, 2.5 mg, Nebulization, Q2H PRN, Lupita Leash, MD, 2.5 mg at 08/23/11 0923;  albuterol (PROVENTIL) (5 MG/ML) 0.5% nebulizer solution 2.5 mg, 2.5 mg, Nebulization, BID, Micki Riley, MD, 2.5 mg at 08/22/11 1954;  antiseptic oral rinse (BIOTENE) solution 15 mL, 15 mL, Mouth Rinse, QID, Thana Farr, MD, 15 mL at 08/22/11 1600 aspirin tablet 325 mg, 325 mg, Oral, Daily, Micki Riley, MD, 325 mg at 08/23/11 1121;  cloNIDine (CATAPRES - Dosed in mg/24 hr) patch 0.2 mg, 0.2 mg, Transdermal, Weekly, Tarry Kos, MD, 0.2 mg at 08/18/11 2231;  insulin aspart (novoLOG) injection 10 Units, 10 Units, Subcutaneous, BID AC, Tara C Jernejcic, PA, 10 Units at 08/22/11 1749 insulin aspart (novoLOG) injection 5 Units, 5 Units, Subcutaneous, QAC breakfast, Tara C Jernejcic, PA, 5 Units at 08/23/11 0742;  insulin glargine (LANTUS) injection 10 Units, 10 Units, Subcutaneous, QHS, Tara C Jernejcic, PA, 10 Units at 08/22/11 2239;  ipratropium (ATROVENT) nebulizer solution 0.5 mg,  0.5 mg, Nebulization, Q2H PRN, Lupita Leash, MD, 0.5 mg at 08/23/11 0923 ipratropium (ATROVENT) nebulizer solution 0.5 mg, 0.5 mg, Nebulization, BID, Micki Riley, MD, 0.5 mg at 08/22/11 1954;  isosorbide mononitrate (IMDUR) 24 hr tablet 30 mg, 30 mg, Oral, Daily, Tarry Kos, MD, 30 mg at 08/23/11 1121;  latanoprost (XALATAN) 0.005 % ophthalmic solution 1 drop, 1 drop, Both Eyes, QHS, Tarry Kos, MD, 1 drop at 08/22/11 2230 metoprolol tartrate (LOPRESSOR) tablet 25 mg, 25 mg, Oral, BID, Elliot Cousin, MD, 25 mg at 08/23/11 1121;  ondansetron (ZOFRAN) injection 4 mg, 4 mg, Intravenous, Q6H PRN, Tarry Kos, MD, 4 mg at 08/19/11 2043;  ondansetron (ZOFRAN) tablet 4 mg, 4 mg, Oral, Q6H PRN, Tarry Kos, MD;  pantoprazole (PROTONIX) EC tablet 40 mg, 40 mg, Oral, Q1200, Micki Riley, MD, 40 mg at 08/23/11 1122 pneumococcal 23 valent vaccine (PNU-IMMUNE) injection 0.5 mL, 0.5 mL, Intramuscular, Tomorrow-1000, Micki Riley, MD;  potassium chloride SA (K-DUR,KLOR-CON) CR tablet 20 mEq, 20 mEq, Oral, BID, Tarry Kos, MD, 20 mEq at 08/23/11 1120;  RESOURCE THICKENUP CLEAR, , Oral, PRN, Layne Benton, NP;  senna-docusate (Senokot-S) tablet 1 tablet, 1 tablet, Oral, QHS PRN, Thana Farr, MD, 1 tablet at 08/23/11 1121 sodium chloride 0.9 % injection 3 mL, 3 mL, Intravenous, Q12H, Tarry Kos, MD, 3 mL at 08/22/11 2229;  sodium chloride 0.9 % injection 3 mL, 3 mL, Intravenous, PRN, Tarry Kos, MD;  sulfamethoxazole-trimethoprim (BACTRIM DS) 800-160 MG per tablet 1 tablet, 1 tablet, Oral, Q12H, Micki Riley, MD;  DISCONTD: cefTRIAXone (ROCEPHIN) 1 g in dextrose 5 % 50 mL IVPB, 1 g, Intravenous, Q24H, Tarry Kos, MD, 1 g at 08/22/11 2053  Patients Current Diet: Dysphagia 1 with nectar liquids. Repeat swallow eval today results pending  Precautions / Restrictions Precautions Precautions: Fall Restrictions Weight Bearing Restrictions: No   Prior Activity Level   Home Assistive Devices / Equipment Home Assistive Devices/Equipment: Eyeglasses;Nebulizer Home Adaptive Equipment: Walker - rolling;Other (comment) (has RW but does not use it normally)  Prior Functional Level Prior Function Level of Independence: Independent with assistive device(s) Able to Take Stairs?: Yes Driving: No Vocation: Retired  Current Functional Level Cognition  Arousal/Alertness: Awake/alert Overall Cognitive Status: Impaired Overall Cognitive Status: Impaired Memory Deficits: Pt able to name her 2 children in the room.  Disoriented to place, time and situation. Orientation Level: Oriented to person;Oriented to place;Oriented to situation Following Commands: Follows one step commands  consistently Safety/Judgement: Decreased awareness of safety precautions;Decreased safety judgement for tasks assessed Awareness of Errors: Assistance required to identify errors made Attention: Focused;Sustained Focused Attention: Appears intact Sustained Attention: Impaired Sustained Attention Impairment: Verbal basic;Functional basic Memory: Impaired Memory Impairment: Storage deficit;Decreased recall of new information;Decreased short term memory Decreased Short Term Memory: Verbal basic;Functional basic Awareness: Impaired Awareness Impairment: Intellectual impairment;Emergent impairment;Anticipatory impairment Problem Solving: Impaired Problem Solving Impairment: Verbal basic;Functional basic Executive Function: Sequencing;Organizing;Decision Making;Self Monitoring;Self Correcting Sequencing: Impaired Sequencing Impairment: Verbal basic;Functional basic Organizing: Impaired Organizing Impairment: Verbal basic;Functional basic Decision Making: Impaired Decision Making Impairment: Verbal basic;Functional basic Self Monitoring: Impaired Self Monitoring Impairment: Verbal basic;Functional basic Self Correcting: Impaired Self Correcting Impairment: Verbal basic;Functional basic Behaviors: Perseveration Safety/Judgment: Impaired    Extremity Assessment (includes Sensation/Coordination)  RUE ROM/Strength/Tone: Within functional levels RUE Sensation: WFL - Light Touch RUE Coordination: WFL - gross/fine motor  RLE ROM/Strength/Tone: Deficits RLE ROM/Strength/Tone Deficits: Grossly 3+/5    ADLs  Eating/Feeding: Other (comment) (daughter reported feeding pt breakfast) Where Assessed - Eating/Feeding: Bed level  Grooming: Simulated;Wash/dry hands;Wash/dry face;Moderate assistance Where Assessed - Grooming: Supported sitting Upper Body Dressing: Performed;Maximal assistance Where Assessed - Upper Body Dressing: Supported sitting Toilet Transfer: Simulated;+2 Total  assistance Toilet Transfer: Patient Percentage: 20% Statistician Method: Stand pivot Acupuncturist: Raised toilet seat with arms (or 3-in-1 over toilet) Equipment Used: Gait belt Transfers/Ambulation Related to ADLs: pt unable to perform at this time ADL Comments: Improvement noted in L side regard, both visually and physically.  Instructed family to approach pt to L side and to encourage her participation in feeding rather than performing for her. Also instructed pt and familty in positioning L UE and protecting it from injury.  Majority of therapy session focusing on proximal stability in sitting with trunk activities and WB on L UE.    Mobility  Bed Mobility: Rolling Left;Left Sidelying to Sit;Scooting to [facility];Sit to Supine Rolling Left: 1: +2 Total assist;With rail Rolling Left: Patient Percentage: 40% Left Sidelying to Sit: 1: +2 Total assist;With rails Left Sidelying to Sit: Patient Percentage: 40% Supine to Sit: 1: +2 Total assist;HOB elevated Supine to Sit: Patient Percentage: 10% Sitting - Scoot to Edge of Bed: 1: +2 Total assist Sitting - Scoot to Edge of Bed: Patient Percentage: 40% Sit to Supine: 1: +2 Total assist;HOB flat Sit to Supine: Patient Percentage: 40% Scooting to HOB: 1: +2 Total assist Scooting to [facility]: Patient Percentage: 0%    Transfers  Transfers: Sit to Stand;Stand to Sit;Stand Pivot Transfers Sit to Stand: With upper extremity assist;1: +2 Total assist;From bed Sit to Stand: Patient Percentage: 30% Stand to Sit: 1: +2 Total assist;To chair/3-in-1;With armrests Stand to Sit: Patient Percentage: 30%    Ambulation / Gait / Stairs / Wheelchair Mobility  Ambulation/Gait Ambulation/Gait Assistance: Not tested (comment) Ambulation Distance (Feet): 15 Feet Assistive device: Rolling walker Ambulation/Gait Assistance Details: Pt ambulates slowly with RW; attempted ambulation without RW but pt was to unsteady Gait velocity: slow Stairs:  No Wheelchair Mobility Wheelchair Mobility: No    Posture / Balance Static Sitting Balance Static Sitting - Balance Support: Right upper extremity supported;Feet supported Static Sitting - Level of Assistance: 5: Stand by assistance;2: Max assist;Other (comment) (balance improved as pt sat, progressing to SBA briefly.) Static Sitting - Comment/# of Minutes: 12     Previous Home Environment Living Arrangements: Children Lives With: Son Available Help at Discharge: Family Type of Home: House Home Layout: One level Home Access: Stairs to enter Entrance Stairs-Rails: Right Entrance Stairs-Number of Steps: 3 Bathroom Shower/Tub: Associate Professor: Yes Home Care Services: No Additional Comments: ALL information obtained from PT evaluation on [id number] at [facility]- Per chart review son reports pt normally only walks ~15 yds due to COPD  Discharge Living Setting Plans for Discharge Living Setting: Lives with (comment) (son and his wife for years) Type of Home at Discharge: House (double wide mobile home) Discharge Home Layout: One level Discharge Home Access: Stairs to enter Entrance Stairs-Rails: Right Entrance Stairs-Number of Steps: 3 Discharge Bathroom Shower/Tub: Tub/shower unit Discharge Bathroom Toilet: Standard Discharge Bathroom Accessibility: Yes How Accessible: Accessible via walker Do you have any problems obtaining your medications?: No  Social/Family/Support Systems Patient Roles: Parent Contact Information: Cindy Abbott, son Anticipated Caregiver: son and his wife Anticipated Caregiver's Contact Information: cell [phone] Ability/Limitations of Caregiver: son disabled back issues since 2007 His wife and adult children can assist Caregiver Availability: 24/7 Discharge Plan Discussed with Primary Caregiver: Yes Is Caregiver In Agreement with Plan?: Yes Does Caregiver/Family have  Issues with Lodging/Transportation  while Pt is in Rehab?: No  Goals/Additional Needs Patient/Family Goal for Rehab: min assist PT and OT, safe oral intake SLP Expected length of stay: ELOS 3 weeks Dietary Needs: Dysphagia 1 diet with nectar (repeat swallow eval today) Additional Information: Daughter-in-law works for a home health agency- will likely want them to proviide the [facility] Pt/Family Agrees to Admission and willing to participate: Yes Program Orientation Provided & Reviewed with Pt/Caregiver Including Roles  & Responsibilities: Yes Additional Information Needs: pt walked short distances pta without AD 15 feet and then would have SOB from COPD  Patient Condition: Please see physician update to information in consult dated 08/20/11.  Preadmission Screen Completed By:  Clois Dupes, 08/23/2011 11:59 AM ______________________________________________________________________   Discussed status with Dr. Riley Kill on 08/23/11 at 1214 and received telephone approval for admission today.  Admission Coordinator:  Clois Dupes, time 1610 Date 09/23/11.

## 2011-08-23 NOTE — Progress Notes (Signed)
Physical Therapy Treatment Patient Details Name: Cindy Abbott MRN: [id number] DOB: 06-Jan-1922 Today's Date: 08/23/2011 Time: 5284-1324 PT Time Calculation (min): 23 min  PT Assessment / Plan / Recommendation Comments on Treatment Session  Patient progressing with mobiilty to EOB and in sitting. Patient with slight SOB and awaiting MBBS from Speech. Did not attempt transfer to recliner this session. Patients daughter present and pleased with transfer    Follow Up Recommendations  Inpatient Rehab    Barriers to Discharge        Equipment Recommendations  Defer to next venue    Recommendations for Other Services    Frequency Min 4X/week   Plan Frequency remains appropriate;Discharge plan remains appropriate    Precautions / Restrictions Precautions Precautions: Fall Restrictions Weight Bearing Restrictions: No   Pertinent Vitals/Pain     Mobility  Bed Mobility Bed Mobility: Rolling Left;Left Sidelying to Sit;Scooting to [facility];Sit to Supine Rolling Left: 1: +2 Total assist Rolling Left: Patient Percentage: 40% Left Sidelying to Sit: 1: +2 Total assist Left Sidelying to Sit: Patient Percentage: 40% Sitting - Scoot to Edge of Bed: 1: +2 Total assist Sitting - Scoot to Edge of Bed: Patient Percentage: 40% Sit to Supine: 1: +2 Total assist Sit to Supine: Patient Percentage: 40% Scooting to HOB: 1: +2 Total assist Scooting to [facility]: Patient Percentage: 0% Details for Bed Mobility Assistance: Pt requiring step by step visual and verbal cues for bed mobility using log roll technique. Transfers Transfers: Not assessed    Exercises     PT Diagnosis:    PT Problem List:   PT Treatment Interventions:     PT Goals Acute Rehab PT Goals PT Goal: Supine/Side to Sit - Progress: Progressing toward goal PT Goal: Sit to Supine/Side - Progress: Progressing toward goal  Visit Information  Last PT Received On: 08/23/11 Assistance Needed: +2 PT/OT Co-Evaluation/Treatment: Yes    Subjective  Data      Cognition  Overall Cognitive Status: Appears within functional limits for tasks assessed/performed Area of Impairment: Memory;Following commands;Safety/judgement;Awareness of errors;Awareness of deficits Arousal/Alertness: Awake/alert Orientation Level: Disoriented X4;Place;Time;Situation Behavior During Session: [facility] for tasks performed Memory Deficits: Pt able to name her 2 children in the room.  Disoriented to place, time and situation. Following Commands: Follows one step commands consistently Safety/Judgement: Decreased awareness of safety precautions;Decreased safety judgement for tasks assessed Awareness of Errors: Assistance required to identify errors made    Balance  Balance Balance Assessed: Yes Static Sitting Balance Static Sitting - Balance Support: Right upper extremity supported;Feet supported Static Sitting - Level of Assistance: 5: Stand by assistance;2: Max assist Static Sitting - Comment/# of Minutes: Patient able to go from Max assist to SBA (for about 30 secs) back to Min A with changes in posture. Cueing and assistance for midline. Patient working on lateral leans and pushing up off elbows  End of Session PT - End of Session Activity Tolerance: Patient tolerated treatment well Patient left: in bed;with call bell/phone within reach Nurse Communication: Mobility status   GP     Fredrich Birks 08/23/2011, 2:46 PM 08/23/2011 Fredrich Birks PTA [phone] pager [phone] office

## 2011-08-23 NOTE — Care Management Note (Signed)
    Page 1 of 1   08/23/2011     5:18:05 PM   CARE MANAGEMENT NOTE 08/23/2011  Patient:  Cindy Abbott, Cindy Abbott   Account Number:  [id number]  Date Initiated:  08/23/2011  Documentation initiated by:  Jacquelynn Cree  Subjective/Objective Assessment:   Admitted with CVa     Action/Plan:   PT/OT eval-recommended inpatient rehab   Anticipated DC Date:  08/23/2011   Anticipated DC Plan:  IP REHAB FACILITY      DC Planning Services  CM consult      PAC Choice  IP REHAB   Choice offered to / List presented to:             Status of service:  Completed, signed off Medicare Important Message given?   (If response is "NO", the following Medicare IM given date fields will be blank) Date Medicare IM given:   Date Additional Medicare IM given:    Discharge Disposition:  IP REHAB FACILITY  Per UR Regulation:  Reviewed for med. necessity/level of care/duration of stay  If discussed at Long Length of Stay Meetings, dates discussed:    Comments:  08/23/11 discharged to inpatient rehab

## 2011-08-23 NOTE — Progress Notes (Signed)
Patient received from 4 north alert and oriented x 2 at 1625. Mild slurred speech noted . Bilateral heels red, boggy. Elevated heels off bed on pillows . Mild left neglect noted . Left side weaker than right . Oriented patient to room and call bell system . Oriented patient's son and daughter to rehab . Patient and family verbalized understanding of rehab process . Continue with plan of care.                                                                                Cindy Abbott

## 2011-08-23 NOTE — Discharge Summary (Signed)
Physician Discharge Summary  Patient ID: Cindy Abbott MRN: [id number] DOB/AGE: 03-04-1922 78 y.o.  Admit date: 08/18/2011 Discharge date: 08/23/2011  Admission Diagnoses: Stroke  Discharge Diagnoses:  Right brain subcortical Infarct secondary to right ICA occlusion with left hemiparesis treated with IV TPA  New urinary Tract Infection Active Problems:  Coronary artery disease  Asthma  UTI (urinary tract infection)  Confusion  Generalized weakness  Renal insufficiency, mild  Anemia  Hypokalemia  Diabetes mellitus  S/P CABG x 3  CHF, chronic  S/P placement of cardiac pacemaker  S/P colonoscopy with polypectomy  H/O angioedema   Discharged Condition:  Stable.  Hospital Course:  :  Cindy Abbott is an 78 y.o. female who was admitted to [facility] on 08/18/2011 with a UTI. On 08/19/2011 was noted by staff to be doing well until about 1730 when she was noted to have left facial droop, left sided weakness and slurred speech. Family was with the patient and notified staff. Patient soon became less alert as well. Code stroke was called. Initial exam is documented as " the patient was semi-alert and tried to follow commands as ordered. She had a visual field deficit with left visual field neglect, left facial droop, and flaccid paresis of her left arm and left leg. She was able to lift up her her right arm against gravity approximately 45 and was able to flex her right leg. She had noticeable dysarthria". Head CT was unremarkable. Teleneurology was consulted and patient was given tPA. She is now transferred to [facility] for further management. She was admitted to the intensive care unit and blood pressure was tightly controlled. She remained illogically stable but did not show any rapid clinical improvement. MRI could not be done due to patient having pacemaker. CT angiogram showed complete occlusion of the right internal carotid artery in the neck. CT scan showed a small right subcortical infarct. Patient  was seen by physical, occupational and speech therapy and was felt to be a good candidate for inpatient rehabilitation. Echocardiogram showed normal ejection fraction. Lipid profile was normal and hemoglobin A1c was elevated at 8.2% Patient was started on aspirin   for secondary prevention and transferred in a stable condition to inpatient rehabilitation. She still had significant left-sided weakness but was able to move the left side against gravity and was showing steady progression with physical therapy at the time of discharge. She was found to have urinary tract infection with Escherichia coli and started initially on ceftriaxone and subsequently switched to Bactrim and the time of discharge..   Consults: Rehabilitation Significant Diagnostic Studies:  Hemoglobin A1C:   Lab  09/08/11 0407   HGBA1C  8.2*    Fasting Lipid Panel:   Lab  09-08-11 0407   CHOL  121   HDL  27*   LDLCALC  70   TRIG  118   CHOLHDL  4.5   LDLDIRECT  --    Urinalysis:   Lab  08/18/11 1732   COLORURINE  YELLOW   LABSPEC  1.020   PHURINE  5.5   GLUCOSEU  >1000*   HGBUR  MODERATE*   BILIRUBINUR  NEGATIVE   KETONESUR  NEGATIVE   PROTEINUR  30*   UROBILINOGEN  0.2   NITRITE  NEGATIVE   LEUKOCYTESUR  SMALL*    08/19/2011 CT of the brain Age related changes with atrophy and chronic microvascular ischemic change. No visible acute cortical infarct or intracranial hemorrhage.  09-08-2011 CTA NECK Findings: Peripheral right upper lobe 4.1  mm nodule. If the patient is at high risk for bronchogenic carcinoma, follow-up chest CT at 1 year is recommended. If the patient is at low risk, no follow-up is needed. This recommendation follows the consensus statement: Guidelines for Management of Small Pulmonary Nodules Detected on CT Scans: A Statement from the Fleischner Society as published in Radiology 2005; 237:395-400. Prominent plaque aortic arch and origin great vessels. Mild to slightly moderate narrowing of the  origin great vessels. Innominate artery bifurcation prominent plaque with mild to moderate narrowing. Plaque of the right common carotid artery with mild to moderate narrowing. Right internal carotid artery is occluded just beyond its origin. Plaque with narrowing of the left common carotid artery. 68% maximal stenosis of the left common carotid artery 8 cm above its origin and 2 cm proximal to the bifurcation. Plaque of the proximal left internal carotid artery with 72% diameter stenosis. Ectatic left internal carotid artery distal vertical segment. Mild to moderate narrowing left subclavian artery. Moderate to marked narrowing proximal left vertebral artery. Moderate narrowing proximal right vertebral artery. Review of the MIP images confirms the above findings. IMPRESSION: Right internal carotid artery is occluded just beyond its origin. Plaque of the right common carotid artery with mild to moderate narrowing. 68% maximal stenosis of the left common carotid artery 8 cm above its origin and 2 cm proximal to the bifurcation. Plaque of the proximal left internal carotid artery with 72% diameter stenosis. Mild to moderate narrowing left subclavian artery. Moderate to marked narrowing proximal left vertebral artery. Moderate narrowing proximal right vertebral artery.  08/20/2011 CTA HEAD Findings: Acute small posterior right lenticular nucleus infarct. Subtle haziness of gray-white differentiation right hemisphere may reflect early acute larger right hemispheric infarct. Small vessel disease type changes. Global atrophy without hydrocephalus. No intracranial hemorrhage. No intracranial mass or abnormal enhancement. Right internal carotid artery is occluded. Reconstitution of flow right carotid terminus region. Calcified left internal carotid artery cavernous segment with moderate to slightly marked narrowing. Mild narrowing and irregularity of the A1 segment and M1 segment of the anterior cerebral artery and middle  cerebral artery bilaterally respectively. Small slightly irregular left vertebral artery. Dominant right vertebral artery with mild narrowing and irregularity. Attenuated irregular basilar artery. No aneurysm noted. Review of the MIP images confirms the above findings. IMPRESSION: Right internal carotid artery is occluded. Reconstitution of flow right carotid terminus region. Intracranial atherosclerotic type changes otherwise as detailed above. Acute small posterior right lenticular nucleus infarct. Subtle haziness of gray-white differentiation right hemisphere may reflect early acute larger right hemispheric infarct. Cindy Abbott, M.D.  2D Echocardiogram 08/19/2011 EF 55-60% with no source of embolus. Probable hypokinesis of the apical myocardium. Mitral valve: Mild to at worst moderate regurgitation  CXR  08/19/2011 Marked enlargement of the cardiopericardial silhouette. No acute abnormality.  08/19/2011 Cardiomegaly without failure. No acute abnormality or interval change. 08/18/2011 : No acute findings.  Therapies:  PT:CIR recommended  WUJ:[id number] indicated mainly due to decreased sensation and weaknes. +s/s of aspiration noted s/p swallow of trial ice chips and thin water by spoon and cup due to marked delay in initiation. Solid Po trials not attempted secondary to patient's dentures not available and due to oral dysphagia . Recommend to initiate conservative diet of dysphagia 1 (puree) and nectar thick liquids with full supervision with all meals as aspiration risk remains. ST to follow for diet tolerance and possible diet advancement.     Treatments:  [facility] at [facility]  Discharge Exam: Blood  pressure 182/55, pulse 60, temperature 98.8 F (37.1 C), temperature source Oral, resp. rate 16, height 5\' 2"  (1.575 m), weight 70.5 kg (155 lb 6.8 oz), SpO2 96.00%.  Mental Status:  Alert, oriented to self and place, thought content appropriate. Speech fluent without evidence of aphasia.  Able to follow 3 step commands without difficulty.  Cranial Nerves:  II- does not blink to threat on the left.  III/IV/VI-Extraocular movements intact. Pupils reactive bilaterally.  V/VII-mild lower left facial droop  VIII-hearing grossly intact  IX/X-not assessed  XI-bilateral shoulder shrug intact  XII-midline tongue extension  Motor: 3+/5 LUE/LLE, 5/5 RUE/RLE with normal tone and bulk. No neglect.  Sensory: Light touch intact throughout, bilaterally  Deep Tendon Reflexes: 2+ and symmetric throughout  Plantars: up going on left.  Cerebellar: Normal finger-to-nose on right  Disposition: Final discharge disposition not confirmed   Medication List  As of 08/23/2011 12:35 PM   ASK your doctor about these medications         albuterol (5 MG/ML) 0.5% nebulizer solution   Commonly known as: PROVENTIL   Take 2.5 mg by nebulization every 6 (six) hours as needed.      aspirin EC 81 MG tablet   Take 81 mg by mouth every morning.      bumetanide 1 MG tablet   Commonly known as: BUMEX   Take 1 mg by mouth daily. *Only take if fluid retention is current. Provided via son discretion. Patient is to take Klor-Con with this medication*      cloNIDine 0.2 mg/24hr patch   Commonly known as: CATAPRES - Dosed in mg/24 hr   Place 1 patch onto the skin once a week.      insulin lispro 100 UNIT/ML injection   Commonly known as: HUMALOG   Inject 5-10 Units into the skin 3 (three) times daily before meals. *Inject 5 units in the morning and 10 units at lunch and at dinner*      isosorbide mononitrate 30 MG 24 hr tablet   Commonly known as: IMDUR   Take 30 mg by mouth daily.      latanoprost 0.005 % ophthalmic solution   Commonly known as: XALATAN   Place 1 drop into both eyes at bedtime.      metoprolol 50 MG tablet   Commonly known as: LOPRESSOR   Take 50 mg by mouth 2 (two) times daily.      potassium chloride SA 20 MEQ tablet   Commonly known as: K-DUR,KLOR-CON   Take 20 mEq by  mouth daily. *To be taken when Bumex is taken for fluid retention*           Follow-up Information    Follow up with SETHI,PRAMODKUMAR P, MD in 2 months.   Contact information:   [address], [address] [facility] [place] [address] [phone]          Signed: Gates Abbott 08/23/2011, 12:35 PM

## 2011-08-23 NOTE — Progress Notes (Signed)
Occupational Therapy Treatment Patient Details Name: Cindy Abbott MRN: [id number] DOB: [birth year] Today's Date: 08/23/2011 Time: 8469-6295 OT Time Calculation (min): 23 min  OT Assessment / Plan / Recommendation Comments on Treatment Session Pt demonstrated improvement in L UE return of movement and bed mobilty.  Immediately willing to work with therapies.  Family needs further education on how they may contribute to pt's recovery.    Follow Up Recommendations  Inpatient Rehab    Barriers to Discharge       Equipment Recommendations  Defer to next venue    Recommendations for Other Services Rehab consult  Frequency Min 2X/week   Plan Discharge plan remains appropriate    Precautions / Restrictions Precautions Precautions: Fall Restrictions Weight Bearing Restrictions: No   Pertinent Vitals/Pain No pain    ADL  Eating/Feeding: Other (comment) (daughter reported feeding pt breakfast) Where Assessed - Eating/Feeding: Bed level ADL Comments: Improvement noted in L side regard, both visually and physically.  Instructed family to approach pt to L side and to encourage her participation in feeding rather than performing for her. Also instructed pt and familty in positioning L UE and protecting it from injury.  Majority of therapy session focusing on proximal stability in sitting with trunk activities and WB on L UE.    OT Diagnosis:    OT Problem List:   OT Treatment Interventions:     OT Goals Acute Rehab OT Goals Potential to Achieve Goals: Good Miscellaneous OT Goals Miscellaneous OT Goal #1: Pt will tolerate weight bearing on Lt Ue for 2 minutes as precursor to EOB sitting  OT Goal: Miscellaneous Goal #1 - Progress: Progressing toward goals  Visit Information  Last OT Received On: 08/23/11 Assistance Needed: +2 PT/OT Co-Evaluation/Treatment: Yes    Subjective Data      Prior Functioning       Cognition  Overall Cognitive Status: Impaired Area of Impairment:  Memory;Following commands;Safety/judgement;Awareness of errors;Problem solving Arousal/Alertness: Awake/alert Orientation Level: Disoriented to;Place;Situation;Time Behavior During Session: [facility] for tasks performed Memory Deficits: Pt able to name her 2 children in the room.  Disoriented to place, time and situation. Following Commands: Follows one step commands consistently Awareness of Errors: Assistance required to identify errors made    Mobility Bed Mobility Bed Mobility: Rolling Left;Left Sidelying to Sit;Scooting to [facility];Sit to Supine Rolling Left: 1: +2 Total assist;With rail Rolling Left: Patient Percentage: 40% Left Sidelying to Sit: 1: +2 Total assist;With rails Left Sidelying to Sit: Patient Percentage: 40% Sitting - Scoot to Edge of Bed: 1: +2 Total assist Sitting - Scoot to Edge of Bed: Patient Percentage: 40% Sit to Supine: 1: +2 Total assist;HOB flat Sit to Supine: Patient Percentage: 40% Scooting to HOB: 1: +2 Total assist Scooting to [facility]: Patient Percentage: 0% Details for Bed Mobility Assistance: Pt requiring step by step visual and verbal cues for bed mobility using log roll technique.   Exercises    Balance Balance Balance Assessed: Yes Static Sitting Balance Static Sitting - Balance Support: Right upper extremity supported;Feet supported Static Sitting - Level of Assistance: 5: Stand by assistance;2: Max assist;Other (comment) (balance improved as pt sat, progressing to SBA briefly.) Static Sitting - Comment/# of Minutes: 12  End of Session OT - End of Session Activity Tolerance: Patient tolerated treatment well Patient left: in bed;with call bell/phone within reach;with family/visitor present;Other (comment);with nursing in room (did not transfer pt, due for testing) Nurse Communication: Other (comment) (progress, need for breathing tx)  GO     Martie Round  Lynn 08/23/2011, 11:18 AM [phone]

## 2011-08-23 NOTE — Progress Notes (Signed)
History: Cindy Abbott is an 78 y.o. female who was admitted to [facility] on 08/18/2011 with a UTI. Today 08/19/2011 was noted by staff to be doing well until about 1730 when she was noted to have left facial droop, left sided weakness and slurred speech. Family was with the patient and notified staff. Patient soon became less alert as well. Code stroke was called. Initial exam is documented as " the patient was semi-alert and tried to follow commands as ordered. She had a visual field deficit with left visual field neglect, left facial droop, and flaccid paresis of her left arm and left leg. She was able to lift up her her right arm against gravity approximately 45 and was able to flex her right leg. She had noticeable dysarthria". Head CT was unremarkable. Teleneurology was consulted and patient was given tPA. She is now transferred to [facility] for further management.   Subjective: I'm doing ok.  Feeling stronger. Son at bedside says can provide 24 hr care at home and wants inpatient rehab for her. Objective: BP 182/55  Pulse 60  Temp 98.8 F (37.1 C) (Oral)  Resp 16  Ht 5\' 2"  (1.575 m)  Wt 70.5 kg (155 lb 6.8 oz)  BMI 28.43 kg/m2  SpO2 96%  CBGs  Basename 08/23/11 0644 08/23/11 0216 08/22/11 2233 08/22/11 1656 08/22/11 1132 08/22/11 0659 08/21/11 2151 08/21/11 1624 08/21/11 1120 08/21/11 0754  GLUCAP 205* 258* 195* 140* 185* 180* 250* 255* 288* 225*    Diet: D1, nectar  Activity: up with assistance  DVT Prophylaxis: SCD  Medications: Scheduled:    . albuterol  2.5 mg Nebulization BID   And  . ipratropium  0.5 mg Nebulization BID  . antiseptic oral rinse  15 mL Mouth Rinse QID  . aspirin  325 mg Oral Daily  . cloNIDine  0.2 mg Transdermal Weekly  . insulin aspart  10 Units Subcutaneous BID AC  . insulin aspart  5 Units Subcutaneous QAC breakfast  . insulin glargine  10 Units Subcutaneous QHS  . isosorbide mononitrate  30 mg Oral Daily  . latanoprost  1 drop Both Eyes QHS  . metoprolol  25  mg Oral BID  . pantoprazole  40 mg Oral Q1200  . pneumococcal 23 valent vaccine  0.5 mL Intramuscular Tomorrow-1000  . potassium chloride SA  20 mEq Oral BID  . sodium chloride  3 mL Intravenous Q12H  . sulfamethoxazole-trimethoprim  1 tablet Oral Q12H  . DISCONTD: cefTRIAXone (ROCEPHIN)  IV  1 g Intravenous Q24H    Neurologic Exam: Mental Status: Alert, oriented to self and place, thought content appropriate.  Speech fluent without evidence of aphasia. Able to follow 3 step commands without difficulty. Cranial Nerves: II- does not blink to threat on the left. III/IV/VI-Extraocular movements intact.  Pupils reactive bilaterally. V/VII-mild lower left facial droop VIII-hearing grossly intact IX/X-not assessed XI-bilateral shoulder shrug intact XII-midline tongue extension Motor: 3+/5 LUE/LLE, 5/5 RUE/RLE with normal tone and bulk.  No neglect. Sensory: Light touch intact throughout, bilaterally Deep Tendon Reflexes: 2+ and symmetric throughout Plantars: up going on left. Cerebellar: Normal finger-to-nose on right.  Lungs: decreased air movement throughout.  No rhonchi or wheeze.  Lab Results: Basic Metabolic Panel:  Lab 08/23/11 1610 08/20/11 0001 08/19/11 0917 08/19/11 0444  NA 143 -- -- 141  K 4.9 4.1 -- --  CL 114* -- -- 105  CO2 23 -- -- 24  GLUCOSE 218* -- -- 313*  BUN 14 -- -- 31*  CREATININE 1.18* -- --  1.30*  CALCIUM 8.6 -- -- 8.2*  MG -- -- 1.6 --  PHOS -- -- -- --   Liver Function Tests:  Lab 08/18/11 1658  AST 15  ALT 11  ALKPHOS 75  BILITOT 1.5*  PROT 6.2  ALBUMIN 2.4*   CBC:  Lab 08/23/11 0552 08/19/11 0444 08/18/11 1658  WBC 9.0 11.3* --  NEUTROABS 7.1 -- 9.2*  HGB 8.6* 9.2* --  HCT 26.6* 28.5* --  MCV 76.2* 76.2* --  PLT 236 208 --   CBG:  Lab 08/23/11 0644 08/23/11 0216 08/22/11 2233 08/22/11 1656 08/22/11 1132 08/22/11 0659  GLUCAP 205* 258* 195* 140* 185* 180*   Hemoglobin A1C:  Lab 08/20/11 0407  HGBA1C 8.2*   Fasting Lipid  Panel:  Lab 08/20/11 0407  CHOL 121  HDL 27*  LDLCALC 70  TRIG 045  CHOLHDL 4.5  LDLDIRECT --   Thyroid Function Tests:  Lab 08/19/11 0917  TSH 0.665  T4TOTAL --  FREET4 --  T3FREE --  THYROIDAB --   Coagulation:  Lab 08/19/11 2016  LABPROT 14.5  INR 1.11   Urinalysis:  Lab 08/18/11 1732  COLORURINE YELLOW  LABSPEC 1.020  PHURINE 5.5  GLUCOSEU >1000*  HGBUR MODERATE*  BILIRUBINUR NEGATIVE  KETONESUR NEGATIVE  PROTEINUR 30*  UROBILINOGEN 0.2  NITRITE NEGATIVE  LEUKOCYTESUR SMALL*    08/19/2011 CT of the brain Age related changes with atrophy and chronic microvascular ischemic change. No visible acute cortical infarct or intracranial hemorrhage.   08/20/2011 CTA NECK  Findings: Peripheral right upper lobe 4.1 mm nodule. If the patient is at high risk for bronchogenic carcinoma, follow-up chest CT at 1 year is recommended.  If the patient is at low risk, no follow-up is needed.  This recommendation follows the consensus statement: Guidelines for Management of Small Pulmonary Nodules Detected on CT Scans:  A Statement from the Fleischner Society as published in Radiology 2005; 237:395-400.  Prominent plaque aortic arch and origin great vessels.  Mild to slightly moderate narrowing of the origin great vessels.  Innominate artery bifurcation prominent plaque with mild to moderate narrowing.  Plaque of the right common carotid artery with mild to moderate narrowing.  Right internal carotid artery is occluded just beyond its origin.  Plaque with narrowing of the left common carotid artery. 68% maximal stenosis of the left common carotid artery 8 cm above its origin and 2 cm proximal to the bifurcation.  Plaque of the proximal left internal carotid artery with 72% diameter stenosis.  Ectatic left internal carotid artery distal vertical segment.  Mild to moderate narrowing left subclavian artery.  Moderate to marked narrowing proximal left vertebral artery.  Moderate narrowing proximal  right vertebral artery.   Review of the MIP images confirms the above findings.  IMPRESSION: Right internal carotid artery is occluded just beyond its origin.  Plaque of the right common carotid artery with mild to moderate narrowing.  68% maximal stenosis of the left common carotid artery 8 cm above its origin and 2 cm proximal to the bifurcation.  Plaque of the proximal left internal carotid artery with 72% diameter stenosis.  Mild to moderate narrowing left subclavian artery.  Moderate to marked narrowing proximal left vertebral artery.  Moderate narrowing proximal right vertebral artery.    08/20/2011 CTA HEAD  Findings:  Acute small posterior right lenticular nucleus infarct. Subtle haziness of gray-white differentiation right hemisphere may reflect early acute larger right hemispheric infarct.  Small vessel disease type changes.  Global atrophy without hydrocephalus.  No intracranial hemorrhage.  No intracranial mass or abnormal enhancement.  Right internal carotid artery is occluded.  Reconstitution of flow right carotid terminus region.  Calcified left internal carotid artery cavernous segment with moderate to slightly marked narrowing.  Mild narrowing and irregularity of the A1 segment and M1 segment of the anterior cerebral artery and middle cerebral artery bilaterally respectively.  Small slightly irregular left vertebral artery.  Dominant right vertebral artery with mild narrowing and irregularity.  Attenuated irregular basilar artery.  No aneurysm noted.   Review of the MIP images confirms the above findings.  IMPRESSION: Right internal carotid artery is occluded.  Reconstitution of flow right carotid terminus region.  Intracranial atherosclerotic type changes otherwise as detailed above.  Acute small posterior right lenticular nucleus infarct. Subtle haziness of gray-white differentiation right hemisphere may reflect early acute larger right hemispheric infarct.   Fuller Canada, M.D.   2D  Echocardiogram 08/19/2011 EF 55-60% with no source of embolus. Probable hypokinesis of the apical myocardium. Mitral valve: Mild to at worst moderate regurgitation  CXR  08/19/2011 Marked enlargement of the cardiopericardial silhouette. No acute abnormality.  08/19/2011 Cardiomegaly without failure. No acute abnormality or interval change. 08/18/2011 : No acute findings.   Therapies:  PT:CIR recommended [id number] indicated mainly due to decreased sensation and weaknes. +s/s of aspiration noted s/p swallow of trial ice chips and thin water by spoon and cup due to marked delay in initiation. Solid Po trials not attempted secondary to patient's dentures not available and due to oral dysphagia . Recommend to initiate conservative diet of dysphagia 1 (puree) and nectar thick liquids with full supervision with all meals as aspiration risk remains. ST to follow for diet tolerance and possible diet advancement.    Assessment: 78 yo aaf with right brain CVA which presented as acute left facial droop, left sided weakness and slurred speech while an IP at [facility] for UTI. Specialists on Call were consulted and patient was administered tPA, 08/19/2011 at 1933 per RN report. On aspirin 81 mg orally every day prior to admission. aspirin 325 mg orally every day ordered daily on admission. Patient with resultant left hemiparesis and dysphagia.   Has progressive mild leukocytosis despite IV rocephin and congested upper airway sounds.  Concern exists for aspiration.    -diabetes, uncontrolleld, HgbA1c 8.2  -LDL 70  -CAD  -Pacemaker  -Asthma  -UTI, > 100k GNR, final report pending, on Rocephin IV D#2  -anemia, Hgb 9.2  -leukocytosis, Wbc 11.2 -few beats of transient ? vtach overnight with no symptoms and normal electrolytes likely insignificant Plan: Change rocephin to Bactrim for UTI. Rehab transfer when bed available. D/W patient and son  LOS: 5 days     08/23/2011  11:05 AM   Delia Heady, MD Medical  Director [facility] Pager: [phone] 08/23/2011 11:05 AM

## 2011-08-23 NOTE — Progress Notes (Signed)
Patient removed foley cath at 1500 has made several unsuccessful to void. At 2215 bladder scan at 431 cc. Intermittent straight cath 650 ml. White vaginal discharged and soreness to vulva noted. Will notify MD.

## 2011-08-23 NOTE — Progress Notes (Signed)
Pt to be admitted to CIR today. I called son and he is aware. [phone] with questions.

## 2011-08-24 ENCOUNTER — Inpatient Hospital Stay (HOSPITAL_COMMUNITY): Payer: Medicare Other | Admitting: *Deleted

## 2011-08-24 ENCOUNTER — Inpatient Hospital Stay (HOSPITAL_COMMUNITY): Payer: Medicare Other | Admitting: Occupational Therapy

## 2011-08-24 ENCOUNTER — Inpatient Hospital Stay (HOSPITAL_COMMUNITY): Payer: Medicare Other | Admitting: Speech Pathology

## 2011-08-24 ENCOUNTER — Inpatient Hospital Stay (HOSPITAL_COMMUNITY): Payer: Medicare Other | Admitting: Physical Therapy

## 2011-08-24 DIAGNOSIS — G8110 Spastic hemiplegia affecting unspecified side: Secondary | ICD-10-CM

## 2011-08-24 DIAGNOSIS — I6330 Cerebral infarction due to thrombosis of unspecified cerebral artery: Secondary | ICD-10-CM

## 2011-08-24 DIAGNOSIS — Z5189 Encounter for other specified aftercare: Secondary | ICD-10-CM

## 2011-08-24 DIAGNOSIS — I639 Cerebral infarction, unspecified: Secondary | ICD-10-CM

## 2011-08-24 LAB — CBC WITH DIFFERENTIAL/PLATELET
Basophils Absolute: 0 10*3/uL (ref 0.0–0.1)
Basophils Relative: 0 % (ref 0–1)
Eosinophils Absolute: 0.2 10*3/uL (ref 0.0–0.7)
Eosinophils Relative: 2 % (ref 0–5)
HCT: 29.9 % — ABNORMAL LOW (ref 36.0–46.0)
Lymphocytes Relative: 12 % (ref 12–46)
MCHC: 31.8 g/dL (ref 30.0–36.0)
MCV: 76.7 fL — ABNORMAL LOW (ref 78.0–100.0)
Monocytes Absolute: 0.6 10*3/uL (ref 0.1–1.0)
Platelets: 279 10*3/uL (ref 150–400)
RDW: 18 % — ABNORMAL HIGH (ref 11.5–15.5)
WBC: 11.4 10*3/uL — ABNORMAL HIGH (ref 4.0–10.5)

## 2011-08-24 LAB — COMPREHENSIVE METABOLIC PANEL
ALT: 17 U/L (ref 0–35)
AST: 19 U/L (ref 0–37)
Albumin: 2.2 g/dL — ABNORMAL LOW (ref 3.5–5.2)
Calcium: 9 mg/dL (ref 8.4–10.5)
Creatinine, Ser: 1.18 mg/dL — ABNORMAL HIGH (ref 0.50–1.10)
GFR calc non Af Amer: 41 mL/min — ABNORMAL LOW (ref >90)
Sodium: 143 mEq/L (ref 135–145)
Total Protein: 6.1 g/dL (ref 6.0–8.3)

## 2011-08-24 LAB — GLUCOSE, CAPILLARY
Glucose-Capillary: 115 mg/dL — ABNORMAL HIGH (ref 70–99)
Glucose-Capillary: 146 mg/dL — ABNORMAL HIGH (ref 70–99)
Glucose-Capillary: 76 mg/dL (ref 70–99)
Glucose-Capillary: 76 mg/dL (ref 70–99)
Glucose-Capillary: 208 mg/dL — ABNORMAL HIGH (ref 70–99)

## 2011-08-24 MED ORDER — LIDOCAINE HCL 2 % EX GEL
CUTANEOUS | Status: DC | PRN
  Filled 2011-08-24: qty 5

## 2011-08-24 MED ORDER — CLOTRIMAZOLE 2 % VA CREA
1.0000 | TOPICAL_CREAM | Freq: Every day | VAGINAL | Status: DC
  Administered 2011-08-24 – 2011-08-25 (×2): 1 via VAGINAL
  Filled 2011-08-24 (×2): qty 22.2

## 2011-08-24 NOTE — Evaluation (Signed)
Occupational Therapy Assessment and Plan  Patient Details  Name: Vivian Neuwirth MRN: [id number] Date of Birth: 1922-02-28  OT Diagnosis: abnormal posture, cognitive deficits, disturbance of vision, hemiplegia affecting non-dominant side and muscle weakness (generalized) Rehab Potential: Rehab Potential: Good ELOS: 3 weeks   Today's Date: 08/24/2011 Time: 0930-1030 Time Calculation (min): 60 min  Problem List:  Patient Active Problem List  Diagnosis  . Coronary artery disease  . Asthma  . UTI (urinary tract infection)  . Confusion  . Generalized weakness  . Renal insufficiency, mild  . Anemia  . Hypokalemia  . Diabetes mellitus  . S/P CABG x 3  . CHF, chronic  . S/P placement of cardiac pacemaker  . S/P colonoscopy with polypectomy  . H/O angioedema  . CVA (cerebral infarction)    Past Medical History:  Past Medical History  Diagnosis Date  . Coronary artery disease   . Diabetes mellitus   . Pacemaker   . Asthma   . Stroke, acute, thrombotic 08/19/2011   Past Surgical History:  Past Surgical History  Procedure Date  . Cardiac surgery   . Coronary angioplasty with stent placement   . Coronary artery bypass graft   . Abdominal hysterectomy   . Colon polpys     Assessment & Plan Clinical Impression: Patient is a 78 y.o. year old female with recent admission to the hospital on 08/19/2011 from [facility] with acute onset of left-sided weakness slurred speech. Initial cranial CT scan showed age-related atrophy without acute change. Patient did receive TPA. Full workup is ongoing per neurology services. Right brain subcortical Infarct secondary to right ICA occlusion with left hemiparesis treated with IV TPA.  Carotid Dopplers with bilateral 40-59% ICA stenosis. Echocardiogram with ejection fraction of 70% without embolus. CTA of the head with right internal carotid artery occlusion. Neurology services consulted placed on aspirin therapy 325 mg daily. A urine study was  completed showing Escherichia coli UTI and placed on Rocephin 08/19/2011 and changed to Bactrim 08/23/2011. Hemoglobin A1c of 8.2 with insulin therapy as directed. Bedside swallowing evaluation placed on dysphagia 1 pured nectar thick liquid diet with formal swallow study completed 08/23/2011 advised to continue pured 1 consistency but can advance to thin liquids.  Patient transferred to CIR on 08/23/2011 .    Patient currently requires total with basic self-care skills secondary to muscle weakness, decreased oxygen support, impaired timing and sequencing, unbalanced muscle activation, decreased coordination and decreased motor planning, decreased visual perceptual skills and decreased visual motor skills, decreased attention to left, decreased initiation, decreased attention, decreased awareness, decreased problem solving, decreased safety awareness, decreased memory and delayed processing and decreased sitting balance, decreased standing balance and decreased postural control.  Prior to hospitalization, patient could complete BADL with supervision.  Patient will benefit from skilled intervention to decrease level of assist with basic self-care skills and increase independence with basic self-care skills prior to discharge home with care partner.  Anticipate patient will require 24 hour supervision and follow up home health.  OT - End of Session Activity Tolerance: Tolerates 30+ min activity with multiple rests Endurance Deficit: Yes OT Assessment Rehab Potential: Good OT Plan OT Frequency: 1-2 X/day, 60-90 minutes Estimated Length of Stay: 3 weeks OT Treatment/Interventions: Balance/vestibular training;Cognitive remediation/compensation;Discharge planning;DME/adaptive equipment instruction;Functional mobility training;Neuromuscular re-education;Pain management;Patient/family education;Psychosocial support;Self Care/advanced ADL retraining;Therapeutic Activities;Therapeutic Exercise;UE/LE Strength  taining/ROM;UE/LE Coordination activities;Visual/perceptual remediation/compensation OT Recommendation Follow Up Recommendations: Home health OT Equipment Recommended: 3 in 1 bedside comode;Tub/shower bench Equipment Details: equipment TBD upon discussion with  family member as pt poor report  OT Evaluation Precautions/Restrictions  Precautions Precautions: Fall Precaution Comments: Dys 1, Nectar thick liquids, TEDs donned OOB General Family/Caregiver Present: No Vital Signs Therapy Vitals BP: 190/66 mmHg Oxygen Therapy SpO2: 96 % O2 Device: None (Room air) Pain Pain Assessment Pain Assessment: No/denies pain Home Living/Prior Functioning Home Living Lives With: Son Available Help at Discharge: Family Type of Home: House Bathroom Shower/Tub: Tub/shower unit ADL ADL Grooming: Maximal assistance Where Assessed-Grooming: Edge of bed Upper Body Bathing: Maximal assistance Where Assessed-Upper Body Bathing: Edge of bed Lower Body Bathing: Dependent Where Assessed-Lower Body Bathing: Edge of bed Upper Body Dressing: Maximal assistance Where Assessed-Upper Body Dressing: Edge of bed Lower Body Dressing: Dependent Where Assessed-Lower Body Dressing: Bed level Toileting: Dependent Where Assessed-Toileting: Bedside Commode Toilet Transfer: Maximal assistance Toilet Transfer Method: Stand pivot Toilet Transfer Equipment: Bedside commode ADL Comments: Pt extremely weak on eval, required mod assist to maintain sitting balance.  Pt with wheezing and c/o SOB, O2 approx 95% with activity.  Completed LB dressing at bed level secondary to fatigue, inability to maintain sitting posture, and decreased standing balance Vision/Perception  Vision - History Baseline Vision:  (unknown) Vision - Assessment Eye Alignment: Within Functional Limits Vision Assessment: Vision tested Ocular Range of Motion: Within Functional Limits Tracking/Visual Pursuits: Decreased smoothness of vertical  tracking;Requires cues, head turns, or add eye shifts to track;Decreased smoothness of horizontal tracking Saccades: Undershoots Convergence: Impaired - to be further tested in functional context Perception Perception: Impaired Inattention/Neglect: Impaired-to be further tested in functional context (decreased attention to Lt visual field)  Cognition Overall Cognitive Status: Impaired Arousal/Alertness: Awake/alert Orientation Level: Disoriented to person;Disoriented to place;Disoriented to time;Disoriented to situation Attention: Focused;Sustained Focused Attention: Appears intact Sustained Attention: Impaired Sustained Attention Impairment: Verbal basic;Functional basic Memory: Impaired Memory Impairment: Storage deficit;Retrieval deficit;Decreased recall of new information;Decreased long term memory;Decreased short term memory Awareness: Impaired Awareness Impairment: Intellectual impairment Problem Solving: Impaired Problem Solving Impairment: Verbal basic;Functional basic Safety/Judgment: Impaired Sensation Sensation Light Touch: Appears Intact Stereognosis: Not tested Hot/Cold: Appears Intact Proprioception: Impaired by gross assessment Coordination Gross Motor Movements are Fluid and Coordinated: No Fine Motor Movements are Fluid and Coordinated: No Motor  Motor Motor: Hemiplegia;Abnormal postural alignment and control Motor - Skilled Clinical Observations: L hemiplegia, sits with significant L lateral flexion, decreased weight shift and excursion to R Mobility  Bed Mobility Bed Mobility: Supine to Sit;Sit to Supine Rolling Right: 1: +1 Total assist Rolling Left: 1: +1 Total assist Right Sidelying to Sit: 1: +1 Total assist Supine to Sit: HOB flat;1: +1 Total assist Sitting - Scoot to Edge of Bed: 2: Max assist Sit to Supine: 1: +1 Total assist;HOB flat  Trunk/Postural Assessment     Balance   Extremity/Trunk Assessment RUE Assessment RUE Assessment: Within  Functional Limits (strength grossly 4/5) LUE Assessment LUE Assessment: Exceptions to [facility] (strength grossly 3/5, AROM shoulder flex 60 degrees)  See FIM for current functional status Refer to Care Plan for Long Term Goals  Recommendations for other services: None  Discharge Criteria: Patient will be discharged from OT if patient refuses treatment 3 consecutive times without medical reason, if treatment goals not met, if there is a change in medical status, if patient makes no progress towards goals or if patient is discharged from hospital.  The above assessment, treatment plan, treatment alternatives and goals were discussed and mutually agreed upon: by patient  [name] 08/24/2011, 12:24 PM

## 2011-08-24 NOTE — Evaluation (Signed)
Physical Therapy Assessment and Plan  Patient Details  Name: Cindy Abbott MRN: [id number] Date of Birth: 28-Nov-1922  PT Diagnosis: Abnormal posture, Abnormality of gait, Cognitive deficits and Hemiplegia non-dominant Rehab Potential: Fair ELOS: 3-4 weeks   Today's Date: 08/24/2011 Time: 1100-1155 Time Calculation (min): 55 min  Problem List:  Patient Active Problem List  Diagnosis  . Coronary artery disease  . Asthma  . UTI (urinary tract infection)  . Confusion  . Generalized weakness  . Renal insufficiency, mild  . Anemia  . Hypokalemia  . Diabetes mellitus  . S/P CABG x 3  . CHF, chronic  . S/P placement of cardiac pacemaker  . S/P colonoscopy with polypectomy  . H/O angioedema  . CVA (cerebral infarction)    Past Medical History:  Past Medical History  Diagnosis Date  . Coronary artery disease   . Diabetes mellitus   . Pacemaker   . Asthma   . Stroke, acute, thrombotic 08/19/2011   Past Surgical History:  Past Surgical History  Procedure Date  . Cardiac surgery   . Coronary angioplasty with stent placement   . Coronary artery bypass graft   . Abdominal hysterectomy   . Colon polpys     Assessment & Plan Clinical Impression: Patient is a 78 y.o. right-handed female with history of coronary artery disease with pacemaker, diabetes mellitus. Admitted 08/19/2011 from [facility] with acute onset of left-sided weakness slurred speech. Initial cranial CT scan showed age-related atrophy without acute change. Patient did receive TPA. Full workup is ongoing per neurology services. Carotid Dopplers with bilateral 40-59% ICA stenosis. Echocardiogram with ejection fraction of 70% without embolus. CTA of the head with right internal carotid artery occlusion. Neurology services consulted placed on aspirin therapy 325 mg daily. A urine study was completed showing Escherichia coli UTI and placed on Rocephin 08/19/2011 and changed to Bactrim 08/23/2011.  Patient transferred to  CIR on 08/23/2011 .   Patient currently requires total with mobility secondary to muscle weakness, decreased cardiorespiratoy endurance, decreased motor planning, decreased attention to left, decreased initiation, decreased attention, decreased awareness, decreased problem solving, decreased safety awareness and decreased memory and decreased sitting balance, decreased standing balance, decreased postural control, hemiplegia and decreased balance strategies.  Prior to hospitalization, patient was supervision with mobility and lived with Son in a House home.  Home access is unsure at this time secondary to family not being present at eval; TBD   .  Patient will benefit from skilled PT intervention to maximize safe functional mobility, minimize fall risk and decrease caregiver burden for planned discharge home with 24 hour assist.  Anticipate patient will benefit from follow up [facility] at discharge.  PT - End of Session Activity Tolerance: Decreased this session;Tolerates 30+ min activity with multiple rests Endurance Deficit: Yes Endurance Deficit Description: very fatigued, SOB but able to maintain Sp02 >95% on RA, COPD PT Assessment Rehab Potential: Fair Barriers to Discharge: Other (comment) (no caregivers present at eval) PT Plan PT Frequency: 1-2 X/day, 60-90 minutes;5 out of 7 days Estimated Length of Stay: 3-4 weeks PT Treatment/Interventions: Ambulation/gait training;Balance/vestibular training;Discharge planning;DME/adaptive equipment instruction;Functional mobility training;Neuromuscular re-education;Patient/family education;Stair training;Splinting/orthotics;Therapeutic Activities;Therapeutic Exercise;UE/LE Strength taining/ROM;UE/LE Coordination activities;Wheelchair propulsion/positioning PT Recommendation Follow Up Recommendations: Home health PT;24 hour supervision/assistance Equipment Recommended: Wheelchair (measurements);Wheelchair cushion (measurements);Rolling walker with 5"  wheels Equipment Details: TBD once talk to family  PT Evaluation Precautions/Restrictions Precautions Precautions: Fall Precaution Comments: Dys 1, Nectar thick liquids, TEDs donned OOB, PACEMAKER General @[email or web address] ([phone]::1) Vital Signs Oxygen Therapy SpO2: 96 %  O2 Device: None (Room air) Pain Pain Assessment Pain Assessment: No/denies pain Home Living/Prior Functioning Home Living Lives With: Son Available Help at Discharge: Family Type of Home: House Bathroom Shower/Tub: Tub/shower unit Vision/Perception  Vision - History Baseline Vision:  (unknown) Vision - Assessment Eye Alignment: Within Functional Limits Vision Assessment: Vision tested Ocular Range of Motion: Within Functional Limits Tracking/Visual Pursuits: Decreased smoothness of vertical tracking;Requires cues, head turns, or add eye shifts to track;Decreased smoothness of horizontal tracking Saccades: Undershoots Convergence: Impaired - to be further tested in functional context Perception Perception: Impaired Inattention/Neglect: Impaired-to be further tested in functional context Praxis Praxis: Impaired Praxis Impairment Details: Initiation  Cognition Overall Cognitive Status: Impaired Arousal/Alertness: Awake/alert Orientation Level: Disoriented to person;Disoriented to place;Disoriented to time;Disoriented to situation Attention: Focused;Sustained Focused Attention: Appears intact Sustained Attention: Impaired Sustained Attention Impairment: Verbal basic;Functional basic Memory: Impaired Memory Impairment: Storage deficit;Retrieval deficit;Decreased recall of new information;Decreased long term memory;Decreased short term memory Awareness: Impaired Awareness Impairment: Intellectual impairment Problem Solving: Impaired Problem Solving Impairment: Verbal basic;Functional basic Safety/Judgment: Impaired Sensation Sensation Light Touch: Appears Intact Stereognosis: Not tested Hot/Cold:  Appears Intact Proprioception: Impaired by gross assessment Coordination Gross Motor Movements are Fluid and Coordinated: No Fine Motor Movements are Fluid and Coordinated: No Motor  Motor Motor: Hemiplegia;Abnormal postural alignment and control Motor - Skilled Clinical Observations: L hemiplegia, sits with significant L lateral flexion, decreased weight shift and excursion to R  Mobility Bed Mobility Bed Mobility: Supine to Sit;Sit to Supine Supine to Sit: HOB flat;1: +1 Total assist with initiation of L side rotation across midline Sitting - Scoot to Edge of Bed: 2: Max assist Sit to Supine: 1: +1 Total assist;HOB flat Transfers Stand Pivot Transfers: 1: +1 Total assist with verbal, visual and tactile cues for initiation of anterior lean and sit > stand; performed pivot with total A secondary to significant trunk flexion Locomotion  Ambulation Ambulation/Gait Assistance: 1: +2 Total assist Ambulation Distance (Feet): 20 Feet Assistive device: 2 person hand held assist Ambulation/Gait Assistance Details: +2A secondary to patient inability to stand with trunk fully erect; stands with forward flexion and L lateral flexion and L pelvis retracted; requires total A for full LLE step length, anterior pelvic rotation and hip extension in stance and for upright trunk posture; unable to use RW Stairs / Additional Locomotion Stairs: No Wheelchair Mobility Distance: 150 total A; patient unable to sequence UE propulsion of w/c despite total verbal, tactile and hand over hand cues  Trunk/Postural Assessment  Thoracic Assessment Thoracic Assessment: Exceptions to [facility] (significant flexion at rest) Lumbar Assessment Lumbar Assessment: Exceptions to [facility] (posterior pelvic tilt, decreased lordosis ) Postural Control Postural Control: Deficits on evaluation (rests in trunk flexion, L lateral flexion and L rib expansio)  Balance Static Sitting Balance Static Sitting - Balance Support: Feet  supported;Bilateral upper extremity supported Static Sitting - Level of Assistance: 2: Max assist Dynamic Sitting Balance Dynamic Sitting - Balance Support: Feet supported;Bilateral upper extremity supported Dynamic Sitting - Level of Assistance: 2: Max assist;1: +1 Total assist Dynamic Sitting - Balance Activities: Lateral lean/weight shifting;Reaching for objects Static Standing Balance Static Standing - Balance Support: Bilateral upper extremity supported Static Standing - Level of Assistance: 1: +1 Total assist Dynamic Standing Balance Dynamic Standing - Balance Support: Bilateral upper extremity supported Dynamic Standing - Level of Assistance: 1: +2 Total assist Extremity Assessment  RUE Assessment RUE Assessment: Within Functional Limits (strength grossly 4/5) LUE Assessment LUE Assessment: Exceptions to [facility] (strength grossly 3/5, AROM shoulder flex 60 degrees)  RLE Assessment RLE Assessment: Within Functional Limits LLE Assessment LLE Assessment: Exceptions to [facility] LLE Strength LLE Overall Strength: Deficits LLE Overall Strength Comments: 2-3/5 throughout  See FIM for current functional status Refer to Care Plan for Long Term Goals  Recommendations for other services: None  Discharge Criteria: Patient will be discharged from PT if patient refuses treatment 3 consecutive times without medical reason, if treatment goals not met, if there is a change in medical status, if patient makes no progress towards goals or if patient is discharged from hospital.  The above assessment, treatment plan, treatment alternatives and goals were discussed and mutually agreed upon: No family available/patient unable  Edman Circle [facility] 08/24/2011, 12:43 PM

## 2011-08-24 NOTE — Progress Notes (Signed)
Social Work Assessment and Plan Social Work Assessment and Plan  Patient Details  Name: Cindy Abbott MRN: [id number] Date of Birth: [birth year]  Today's Date: 08/24/2011  Problem List:  Patient Active Problem List  Diagnosis  . Coronary artery disease  . Asthma  . UTI (urinary tract infection)  . Confusion  . Generalized weakness  . Renal insufficiency, mild  . Anemia  . Hypokalemia  . Diabetes mellitus  . S/P CABG x 3  . CHF, chronic  . S/P placement of cardiac pacemaker  . S/P colonoscopy with polypectomy  . H/O angioedema  . CVA (cerebral infarction)   Past Medical History:  Past Medical History  Diagnosis Date  . Coronary artery disease   . Diabetes mellitus   . Pacemaker   . Asthma   . Stroke, acute, thrombotic 08/19/2011   Past Surgical History:  Past Surgical History  Procedure Date  . Cardiac surgery   . Coronary angioplasty with stent placement   . Coronary artery bypass graft   . Abdominal hysterectomy   . Colon polpys    Social History:  reports that she has never smoked. She does not have any smokeless tobacco history on file. She reports that she does not drink alcohol or use illicit drugs.  Family / Support Systems Marital Status: Widow/Widower Patient Roles: Parent Children: Adelena Desantiago  [phone] Other Supports: Daughter close by Anticipated Caregiver: Son and D-I-L Ability/Limitations of Caregiver: Son is disabled due to back issues, wife is in and out.  There are also adult grandchildren who can assist Caregiver Availability: 24/7 Family Dynamics: Pt reports she has good family support.  She states: " They are all close by and make sure I am taken care of."  She feels blessed to have them.  Social History Preferred language: English Religion: [employer] Cultural Background: No issues Education: [employer] Read: Yes Write: Yes Employment Status: Retired Fish farm manager Issues: No issues Guardian/Conservator: None according  to MD he feels pt is able to make her own decisions, but does consult with her children   Abuse/Neglect Physical Abuse: Denies Verbal Abuse: Denies Sexual Abuse: Denies Exploitation of patient/patient's resources: Denies Self-Neglect: Denies  Emotional Status Pt's affect, behavior adn adjustment status: Pt is motivated to improve but does have COPD and this limits here.  She gets short of breath easily and needs to take it easy.  She will let her therapists know when she needs a break. Recent Psychosocial Issues: Other medical issues Pyschiatric History: No issues, deferred depression screen due to pt winded and needing to rest.  Will monitor her coping thorughout her stay. Substance Abuse History: No issues  Patient / Family Perceptions, Expectations & Goals Pt/Family understanding of illness & functional limitations: Pt and son are able to explain her stroke and issues resulting from it.  She hopes to get back to where she was and be mobile.  Son reports she was limited before with her breathing. Premorbid pt/family roles/activities: Mother, grandmother, Retiree, Church member, etc Anticipated changes in roles/activities/participation: resume Pt/family expectations/goals: Pt states: " I want to be walking again and eating without restrictions."  Son states: " I am hopeful she will regain her mobility while here."  [employer]: None Premorbid Home Care/DME Agencies: None Transportation available at discharge: [employer] referrals recommended: Support group (specify) ([facility] Support Group)  Discharge Planning Living Arrangements: Children Support Systems: Children;Other relatives;Friends/neighbors;Church/faith community Type of Residence: Private residence Insurance Resources: [employer] Financial Resources: Social Security Financial Screen Referred: No  Living Expenses: Lives with family Money Management: Family Do you have any problems obtaining your  medications?: No Home Management: Daughter in-law and son Patient/Family Preliminary Plans: Return home with family providing assistance, son is there all of the time due to disabled.  D-I-L is a Charity fundraiser and works during the day for a Estate agent. Social Work Anticipated Follow Up Needs: HH/OP;Support Group  Clinical Impression Pleasant female who is motivated to do what she needs to regain her independence.  Family is very supportive and will do what is needed for her care.  Pt is limited due to her COPD and team needs to build rest breaks into her schedule To accommodate this.  Lucy Chris 08/24/2011, 10:43 AM

## 2011-08-24 NOTE — Progress Notes (Signed)
Occupational Therapy Session Note  Patient Details  Name: Cindy Abbott MRN: [id number] Date of Birth: Nov 06, 1922  Today's Date: 08/24/2011 Time: 0930-1030 and 1333-1400 Time Calculation (min): 60 min and 27 min  Short Term Goals: Week 1:  OT Short Term Goal 1 (Week 1): Pt will complete UB bathing with min assist OT Short Term Goal 2 (Week 1): Pt will complete LB bathing with mod assist OT Short Term Goal 3 (Week 1): Pt will complete UB dressing with min assist OT Short Term Goal 4 (Week 1): Pt will complete LB dressing with mod assist OT Short Term Goal 5 (Week 1): Pt will complete toilet transfer with mod assist  Skilled Therapeutic Interventions/Progress Updates:  Balance/vestibular training;Cognitive remediation/compensation;Discharge planning;DME/adaptive equipment instruction;Functional mobility training;Neuromuscular re-education;Pain management;Patient/family education;Psychosocial support;Self Care/advanced ADL retraining;Therapeutic Activities;Therapeutic Exercise;UE/LE Strength taining/ROM;UE/LE Coordination activities;Visual/perceptual remediation/compensation  1) OT eval initiated, ADL assessment completed.  Upon arrival, pt reported need to have BM.  +2 stand pivot transfer to [facility] with verbal cues for initiation and sequencing.  Pt incontinent of bowel during transfer, continued to have BM on BSC.  Pt with decreased trunk control with forward lean while sitting on BSC with cues to attempt sitting upright.  Completed bathing at EOB with mod assist to maintain sitting in midline at EOB as pt with extreme Lt lean.  Completed LB dressing at bed level secondary to increased fatigue and decreased safety sitting at EOB. Pt required max cues for sequencing, delayed processing, slow initiation. Pt with decreased sustained attention to task which is impacting attention to Lt and ability to complete ADLs.  Pt with ? Lt inattention.  2) 1:1 OT with focus on bed mobility, sitting balance, and LUE NM  re-ed.  Pt asleep upon arrival.  Focus on bed mobility with rolling to Rt and Lt with use of bed rails and cues for attention and hand placement.  Pt required increased cues and increased time for processing.  Pt with approx 60 degrees active shoulder flexion, 160 PROM.  Cues to attend to Lt hand and attempt within range to reach for objects with pt with increased confusion.  Therapy Documentation Precautions:  Precautions Precautions: Fall Precaution Comments: Dys 1, Nectar thick liquids, TEDs donned OOB Restrictions Weight Bearing Restrictions: No General: General Family/Caregiver Present: No Vital Signs: Therapy Vitals BP: 190/66 mmHg Oxygen Therapy SpO2: 96 % O2 Device: None (Room air) Pain: Pain Assessment Pain Assessment: No/denies pain ADL: ADL Grooming: Maximal assistance Where Assessed-Grooming: Edge of bed Upper Body Bathing: Maximal assistance Where Assessed-Upper Body Bathing: Edge of bed Lower Body Bathing: Dependent Where Assessed-Lower Body Bathing: Edge of bed Upper Body Dressing: Maximal assistance Where Assessed-Upper Body Dressing: Edge of bed Lower Body Dressing: Dependent Where Assessed-Lower Body Dressing: Bed level Toileting: Dependent Where Assessed-Toileting: Bedside Commode Toilet Transfer: Maximal assistance Toilet Transfer Method: Stand pivot Toilet Transfer Equipment: Bedside commode ADL Comments: Pt extremely weak on eval, required mod assist to maintain sitting balance.  Pt with wheezing and c/o SOB, O2 approx 95% with activity.  Completed LB dressing at bed level secondary to fatigue, inability to maintain sitting posture, and decreased standing balance  See FIM for current functional status  Therapy/Group: Individual Therapy  [name] 08/24/2011, 12:19 PM

## 2011-08-24 NOTE — Progress Notes (Signed)
At 1200 noon pt scanned for 136cc; had been I/O cathed at 0500 for 500cc after incontinent episode per report.  Pt may have voided with BM this morning.  At 1700, pt scanned for 379cc; assisted up to [facility], voided small amt incontinently with transfer, add'l 375cc on BSC. No I/O at times time, monitor, assist up to BR, [facility] for all void attempts.  LBM this AM after sorbitol. Cindy Abbott

## 2011-08-24 NOTE — Progress Notes (Signed)
Social Work Patient ID: Cindy Abbott, female   DOB: [birth year], 78 y.o.   MRN: [id number] Met with son to discuss goals and give statement of services.  He had concerns from last night, Brett Canales to address with him. Pt reports day went well, she is tired.  Due to pt's COPD and cardiac issues needs rests breaks and a nap.  Son reports she would get Winded at home and could walk the length of the mobile home before needing to rest.  Aware team conference tomorrow and will meet with then.

## 2011-08-24 NOTE — Progress Notes (Signed)
Patient ID: Cindy Abbott, female   DOB: 1922-11-26, 78 y.o.   MRN: [id number]  Subjective/Complaints: No c/o except pain with caths, Nsg notes vaginal D/C Review of Systems  Genitourinary:       Retention  All other systems reviewed and are negative.     Objective: Vital Signs: Blood pressure 194/47, pulse 60, temperature 98.3 F (36.8 C), temperature source Oral, resp. rate 17, height 5\' 5"  (1.651 m), weight 66.8 kg (147 lb 4.3 oz), SpO2 9.00%. Dg Chest 2 View  08/22/2011  *RADIOLOGY REPORT*  Clinical Data: Leukocytosis, congestion, cough.  History diabetes, CAD, CABG, asthma.  CHEST - 2 VIEW  Comparison: 08/19/2011  Findings: Patient has left-sided transvenous pacemaker with lead to the right ventricle.  Patient has had median sternotomy CABG. Heart is enlarged.  There are no focal consolidations.  There are small bilateral pleural effusions.  No overt pulmonary edema.  IMPRESSION:  1.  Cardiomegaly and small bilateral pleural effusions. 2.  No focal consolidations.  Original Report Authenticated By: Patterson Hammersmith, M.D.   Dg Swallowing Func-no Report  08/23/2011  CLINICAL DATA: r/o aspiration   FLUOROSCOPY FOR SWALLOWING FUNCTION STUDY:  Fluoroscopy was provided for swallowing function study, which was  administered by a speech pathologist.  Final results and recommendations  from this study are contained within the speech pathology report.     Results for orders placed during the hospital encounter of 08/23/11 (from the past 72 hour(s))  GLUCOSE, CAPILLARY     Status: Normal   Collection Time   08/23/11  5:35 PM      Component Value Range Comment   Glucose-Capillary 90  70 - 99 mg/dL   GLUCOSE, CAPILLARY     Status: Abnormal   Collection Time   08/23/11  9:17 PM      Component Value Range Comment   Glucose-Capillary 146 (*) 70 - 99 mg/dL   CBC WITH DIFFERENTIAL     Status: Abnormal   Collection Time   08/24/11  6:50 AM      Component Value Range Comment   WBC 11.4 (*) 4.0 - 10.5  K/uL    RBC 3.90  3.87 - 5.11 MIL/uL    Hemoglobin 9.5 (*) 12.0 - 15.0 g/dL    HCT 78.2 (*) 95.6 - 46.0 %    MCV 76.7 (*) 78.0 - 100.0 fL    MCH 24.4 (*) 26.0 - 34.0 pg    MCHC 31.8  30.0 - 36.0 g/dL    RDW 21.3 (*) 08.6 - 15.5 %    Platelets 279  150 - 400 K/uL    Neutrophils Relative 81 (*) 43 - 77 %    Neutro Abs 9.3 (*) 1.7 - 7.7 K/uL    Lymphocytes Relative 12  12 - 46 %    Lymphs Abs 1.4  0.7 - 4.0 K/uL    Monocytes Relative 5  3 - 12 %    Monocytes Absolute 0.6  0.1 - 1.0 K/uL    Eosinophils Relative 2  0 - 5 %    Eosinophils Absolute 0.2  0.0 - 0.7 K/uL    Basophils Relative 0  0 - 1 %    Basophils Absolute 0.0  0.0 - 0.1 K/uL      HEENT: normal Cardio: RRR Resp: CTA B/L GI: BS positive Extremity:  No Edema Skin:   Intact Neuro: Confused, Flat, Normal Sensory and Abnormal Motor 4/5 on Left side UE and LE Musc/Skel:  Normal   Assessment/Plan: 1.  Functional deficits secondary to R ICA infarct with L HP, Mild L neglect which require 3+ hours per day of interdisciplinary therapy in a comprehensive inpatient rehab setting. Physiatrist is providing close team supervision and 24 hour management of active medical problems listed below. Physiatrist and rehab team continue to assess barriers to discharge/monitor patient progress toward functional and medical goals. FIM: FIM - Bathing Bathing: 0: Activity did not occur  FIM - Upper Body Dressing/Undressing Upper body dressing/undressing: 0: Activity did not occur FIM - Lower Body Dressing/Undressing Lower body dressing/undressing: 0: Activity did not occur  FIM - Toileting Toileting: 0: Activity did not occur  FIM - Archivist Transfers: 0-Activity did not occur  FIM - Games developer Transfer: 0: Activity did not occur  FIM - Locomotion: Wheelchair Locomotion: Wheelchair: 0: Activity did not occur FIM - Locomotion: Ambulation Locomotion: Ambulation: 0: Activity did not  occur  Comprehension Comprehension Mode: Auditory Comprehension: 3-Understands basic 50 - 74% of the time/requires cueing 25 - 50%  of the time  Expression Expression Mode: Verbal Expression: 3-Expresses basic 50 - 74% of the time/requires cueing 25 - 50% of the time. Needs to repeat parts of sentences.  Social Interaction Social Interaction: 4-Interacts appropriately 75 - 89% of the time - Needs redirection for appropriate language or to initiate interaction.  Problem Solving Problem Solving: 2-Solves basic 25 - 49% of the time - needs direction more than half the time to initiate, plan or complete simple activities  Memory Memory: 2-Recognizes or recalls 25 - 49% of the time/requires cueing 51 - 75% of the time  Medical Problem List and Plan:  1. 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 DVT Prophylaxis/Anticoagulation: SCDs. Monitor for signs of DVT. Will consider Lovenox therapy  3. Dysphagia. Dysphagia 1 diet with thin liquids as per modified barium swallow 08/23/2011. Monitor for any signs of aspiration. Followup speech therapy. . Monitor hydration while on thickened liquids  4. Neuropsych: This patient is not capable of making decisions on her own behalf.  5. Diabetes mellitus. Hemoglobin A1c of 8.2. Lantus insulin 10 units each bedtime as well as NovoLog 10 units before lunch and supper and 5 units before breakfast  6. Hypertension. Clonidine patch 0.2 mg weekly,.imdur 30 mg daily, Lopressor 25 mg twice a day. Monitor the increased mobility  7. Escherichia coli UTI. Continue Bactrim  8. CAD status post pacemaker. Patient denies any chest pain. Continue aspirin therapy 9.  Yeast vaginitis mycostatin  LOS (Days) 1 A FACE TO FACE EVALUATION WAS PERFORMED  Jani Moronta E 08/24/2011, 7:38 AM

## 2011-08-24 NOTE — Care Management Note (Signed)
Inpatient Rehabilitation Center Individual Statement of Services  Patient Name:  Cindy Abbott  Date:  08/24/2011  Welcome to the Inpatient Rehabilitation Center.  Our goal is to provide you with an individualized program based on your diagnosis and situation, designed to meet your specific needs.  With this comprehensive rehabilitation program, you will be expected to participate in at least 3 hours of rehabilitation therapies Monday-Friday, with modified therapy programming on the weekends.  Your rehabilitation program will include the following services:  Physical Therapy (PT), Occupational Therapy (OT), Speech Therapy (ST), 24 hour per day rehabilitation nursing, Therapeutic Recreaction (TR), Neuropsychology, Case Management (RN and Social Worker), Rehabilitation Medicine, Nutrition Services and Pharmacy Services  Weekly team conferences will be held on Wednesday to discuss your progress.  Your RN Case Designer, television/film set will talk with you frequently to get your input and to update you on team discussions.  Team conferences with you and your family in attendance may also be held.  Expected length of stay: 3 weeks Overall anticipated outcome: min level  Depending on your progress and recovery, your program may change.  Your RN Case Estate agent will coordinate services and will keep you informed of any changes.  Your RN Sports coach and SW names and contact numbers are listed  below.  The following services may also be recommended but are not provided by the Inpatient Rehabilitation Center:   Driving Evaluations  Home Health Rehabiltiation Services  Outpatient Rehabilitatation [facility]  Vocational Rehabilitation   Arrangements will be made to provide these services after discharge if needed.  Arrangements include referral to agencies that provide these services.  Your insurance has been verified to be:  Medicare Your primary doctor is:  Dr. Graciela Husbands  Pertinent  information will be shared with your doctor and your insurance company.   Social Worker:  Dossie Der, [place] [phone]  Information discussed with and copy given to patient by: Lucy Chris, 08/24/2011, 9:20 AM

## 2011-08-24 NOTE — Progress Notes (Signed)
Patient information reviewed and entered into [employer]PRO system by Prestin Munch, RN, CRRN, PPS Coordinator.  Information including medical coding and functional independence measure will be reviewed and updated through discharge.     Per nursing patient was given "Data Collection Information Summary for Patients in Inpatient Rehabilitation Facilities with attached "Privacy Act Statement-Health Care Records" upon admission.   

## 2011-08-24 NOTE — Progress Notes (Signed)
At 0500, pt had voided small amount incont in brief. PVR was 355cc. I&O cath for 500cc, continue to note white vaginal discharge and urethral soreness. Mick Sell, RN

## 2011-08-24 NOTE — Progress Notes (Signed)
Recreational Therapy Session Note  Patient Details  Name: Cindy Abbott MRN: [id number] Date of Birth: [birth year] Today's Date: 08/24/2011  Pt placed on HOLD for TR services at this time due to low activity tolerance.  Will continue to monitor through team for future participation.  Cindy Abbott 08/24/2011, 2:30 PM

## 2011-08-24 NOTE — Evaluation (Signed)
Speech Language Pathology Assessment and Plan  Patient Details  Name: Cindy Abbott MRN: [id number] Date of Birth: 03-11-1922  SLP Diagnosis: Cognitive Impairments;Speech and Language deficits;Dysphagia  Rehab Potential: Fair ELOS: 2-3 weeks    Today's Date: 08/24/2011 Time: 1191-4782 Time Calculation (min): 50 min  Problem List:  Patient Active Problem List  Diagnosis  . Coronary artery disease  . Asthma  . UTI (urinary tract infection)  . Confusion  . Generalized weakness  . Renal insufficiency, mild  . Anemia  . Hypokalemia  . Diabetes mellitus  . S/P CABG x 3  . CHF, chronic  . S/P placement of cardiac pacemaker  . S/P colonoscopy with polypectomy  . H/O angioedema  . CVA (cerebral infarction)   Past Medical History:  Past Medical History  Diagnosis Date  . Coronary artery disease   . Diabetes mellitus   . Pacemaker   . Asthma   . Stroke, acute, thrombotic 08/19/2011   Past Surgical History:  Past Surgical History  Procedure Date  . Cardiac surgery   . Coronary angioplasty with stent placement   . Coronary artery bypass graft   . Abdominal hysterectomy   . Colon polpys     Assessment / Plan / Recommendation Clinical Impression  Cindy Abbott is an 78 y.o. right-handed female with history of CAD with pacemaker and DM. Admitted 08/19/2011 from [facility] with acute onset of left-sided weakness slurred speech. Initial cranial CT scan showed age-related atrophy without acute change. Patient did receive TPA. CTA of the head showed right internal carotid artery occlusion. Patient positive for UTI; bedside swallowing evaluation placed on dysphagia 1 pured nectar-thick liquid diet with formal swallow study completed 08/23/2011 advised to continue pured dysphagia 1 consistency but can advance to thin liquids with full supervision to utilize safe swallow compensatory strategies. Patient transferred to [facility] 08/23/11 and upon evaluation today presents with decreased  orientation, short and long term memory deficits, decreased sustained attention, left inattention, which all impact her ability to safely carryover recommended swallow precautions as well as solve basic problems during familiar tasks.  In addition, patient's speech intelligibility is decreased and impacts her ability to verbally communicate her wants and needs.  As a result, it is recommended that this patient receive skilled SLP series during her CIR to maximize functional independence and reduce burden of care upon discharge.      SLP Assessment  Patient will need skilled Speech Lanaguage Pathology Services during CIR admission    Recommendations  Follow up Recommendations: 24 hour supervision/assistance;Home Health SLP Equipment Recommended: None recommended by SLP    SLP Frequency 1-2 X/day, 30-60 minutes;5 out of 7 days   SLP Treatment/Interventions Cognitive remediation/compensation;Cueing hierarchy;Dysphagia/aspiration precaution training;Environmental controls;Functional tasks;Internal/external aids;Patient/family education;Speech/Language facilitation;Therapeutic Activities;Therapeutic Exercise    Pain Pain Assessment Pain Assessment: No/denies pain Prior Functioning Cognitive/Linguistic Baseline: Information not available  Short Term Goals: Week 1: SLP Short Term Goal 1 (Week 1): Patient will sustain attention to basic familiar task for 1 minute with mod assist semantic cues SLP Short Term Goal 2 (Week 1): Patient will attend to left of environment with mod assist semantic cues during functional activities  SLP Short Term Goal 3 (Week 1): Patient will consume Dys.1 textures and thin liquids with max assist verbal and tactile cues with no overt s/s of aspiration SLP Short Term Goal 4 (Week 1): Patient will utilize external aids for orientation with max assist semantic cues SLP Short Term Goal 5 (Week 1): Patient will increase speech intelligibility during  sentence level expression  with mod assist sematnic cues to utilize compensatory strategies  See FIM for current functional status Refer to Care Plan for Long Term Goals  Recommendations for other services: None  Discharge Criteria: Patient will be discharged from SLP if patient refuses treatment 3 consecutive times without medical reason, if treatment goals not met, if there is a change in medical status, if patient makes no progress towards goals or if patient is discharged from hospital.  The above assessment, treatment plan, treatment alternatives and goals were discussed and mutually agreed upon: No family available/patient unable  Fae Pippin, M.A., CCC-SLP [phone]  Jovonte Commins 08/24/2011, 10:05 AM

## 2011-08-25 ENCOUNTER — Inpatient Hospital Stay (HOSPITAL_COMMUNITY): Payer: Medicare Other | Admitting: Physical Therapy

## 2011-08-25 ENCOUNTER — Inpatient Hospital Stay (HOSPITAL_COMMUNITY): Payer: Medicare Other | Admitting: Speech Pathology

## 2011-08-25 ENCOUNTER — Inpatient Hospital Stay (HOSPITAL_COMMUNITY): Payer: Medicare Other

## 2011-08-25 ENCOUNTER — Inpatient Hospital Stay (HOSPITAL_COMMUNITY): Payer: Medicare Other | Admitting: Occupational Therapy

## 2011-08-25 LAB — GLUCOSE, CAPILLARY
Glucose-Capillary: 148 mg/dL — ABNORMAL HIGH (ref 70–99)
Glucose-Capillary: 168 mg/dL — ABNORMAL HIGH (ref 70–99)

## 2011-08-25 NOTE — Progress Notes (Signed)
Social Work Patient ID: Cindy Abbott, female   DOB: 05/05/1922, 78 y.o.   MRN: [id number] Met with pt and son to inform team conference goals-min level and discharge 9/10.  Son needed a letter for sister to try to get out of jury duty Due to day after pt returns home and will need to be here to train.  Agreeable to goals and will provide the care pt needs at home.  Son concerned Pt needs rest breaks in between therapies, due to breathing issue.  Pt reports feels better than earlier.  Letter given to son to give to sister.  Continue to Work on discharge plans.

## 2011-08-25 NOTE — Patient Care Conference (Signed)
Inpatient RehabilitationTeam Conference Note Date: 08/25/2011   Time: 11:00 AM    Patient Name: Cindy Abbott      Medical Record Number: [id number]  Date of Birth: 20-Apr-1922 Sex: Female         Room/Bed: 4030/4030-01 Payor Info: Payor: MEDICARE  Plan: MEDICARE PART A AND B  Product Type: *No Product type*     Admitting Diagnosis: RT CVA  Admit Date/Time:  08/23/2011  4:19 PM Admission Comments: No comment available   Primary Diagnosis:  CVA (cerebral infarction) Principal Problem: CVA (cerebral infarction)  Patient Active Problem List   Diagnosis Date Noted  . CVA (cerebral infarction) 08/24/2011  . UTI (urinary tract infection) 08/18/2011  . Confusion 08/18/2011  . Generalized weakness 08/18/2011  . Renal insufficiency, mild 08/18/2011  . Anemia 08/18/2011  . Hypokalemia 08/18/2011  . Diabetes mellitus 08/18/2011  . S/P CABG x 3 08/18/2011  . CHF, chronic 08/18/2011  . S/P placement of cardiac pacemaker 08/18/2011  . S/P colonoscopy with polypectomy 08/18/2011  . H/O angioedema 08/18/2011  . Coronary artery disease   . Asthma     Expected Discharge Date: Expected Discharge Date: 09/14/11  Team Members Present: Physician: Dr. Claudette Laws Social Worker Present: Dossie Der, LCSW Nurse Present: Carlean Purl, RN PT Present: Edman Circle, Judith Blonder, PTA OT Present: [name], Felipa Eth, OT SLP Present: Fae Pippin, SLP     Current Status/Progress Goal Weekly Team Focus  Medical   poor activity tolerance,, continent bowel and bladder  improve activity tolerance  adjust therapy schedule to improve tolerance   Bowel/Bladder   occasional I/O cath for retention; UTI  continent without retention  OOB to BR or [facility] for all toileting needs, attempts to void; continue scans.   Swallow/Nutrition/ Hydration   Dys.1 textures and thin liquids with full supervision   min assist  increase carryover of safe swallow strategies    ADL's   max-total assist  bathing and dressing, total assist stand pivot transfer, total assist toileting   min assist overall  static and dynamic sitting balance, sit <> stand, transfers, activity tolerance   Mobility   max A overall  supervision-min A overall  postural control, transfers, gait   Communication             Safety/Cognition/ Behavioral Observations  max-total assist   min assist   increase use of compensatory strategies; family education    Pain   denies  managed 2/10  monitor   Skin   no breakdown; heels boggy; rt heel discolored from h/o PU.  no s/s infection, no breakdown  monitor; float heels, assist to turn and reposition      *See Interdisciplinary Assessment and Plan and progress notes for long and short-term goals  Barriers to Discharge: will need physical assistance upon discharge    Possible Resolutions to Barriers:  identify and train caregivers    Discharge Planning/Teaching Needs:  Home with son and his family who can provide assistance.  Son here to observe in therapies, pt limited PTA due to COPD      Team Discussion:  Pt slowly making progress, SOB with O2 dropping this am-placed on O2 and recovered quickly.  Check chest x-ray-due to CHF.  Revisions to Treatment Plan:  None   Continued Need for Acute Rehabilitation Level of Care: The patient requires daily medical management by a physician with specialized training in physical medicine and rehabilitation for the following conditions: Daily direction of a multidisciplinary physical rehabilitation program to ensure  safe treatment while eliciting the highest outcome that is of practical value to the patient.: Yes Daily medical management of patient stability for increased activity during participation in an intensive rehabilitation regime.: Yes Daily analysis of laboratory values and/or radiology reports with any subsequent need for medication adjustment of medical intervention for : Neurological problems  Mammie Meras, Lemar Livings 08/25/2011, 4:07 PM

## 2011-08-25 NOTE — Progress Notes (Signed)
Occupational Therapy Session Note  Patient Details  Name: Cindy Abbott MRN: [id number] Date of Birth: 11-21-1922  Today's Date: 08/25/2011 Time: 4098-1191 Time Calculation (min): 45 min  Short Term Goals: Week 1:  OT Short Term Goal 1 (Week 1): Pt will complete UB bathing with min assist OT Short Term Goal 2 (Week 1): Pt will complete LB bathing with mod assist OT Short Term Goal 3 (Week 1): Pt will complete UB dressing with min assist OT Short Term Goal 4 (Week 1): Pt will complete LB dressing with mod assist OT Short Term Goal 5 (Week 1): Pt will complete toilet transfer with mod assist  Skilled Therapeutic Interventions/Progress Updates:    Pt seen for ADL retraining with focus on self-care task of bathing, initiation, sequencing, motor planning, processing, and attention.  Pt on 2L O2 upon arrival with PT notifying this therapist of low O2 sats with mobility.  Pt 98% upon arrival and completed bathing at sink with minimal SOB.  Pt removed O2 to wash face for 3-4 min with sats dropping to 88% and HR mid-high 90s.  Returned O2 and pt returned to mid-high 90s and HR decreasing.  Engaged in sit <> stand for LB bathing and to simulate LB dressing.  Pt max assist (lift and lower), however increased control with visual cue of mirror and UE support at sink.  Pt assisted with pulling up TEDS over knees, cues to incorporate LUE with task.  Therapy Documentation Precautions:  Precautions Precautions: Fall Precaution Comments: Dys 1, Nectar thick liquids, TEDs donned OOB Restrictions Weight Bearing Restrictions: No General:   Vital Signs: Therapy Vitals Pulse Rate: 153  BP: 186/94 mmHg Patient Position, if appropriate: Sitting Oxygen Therapy SpO2: 99 % O2 Device: Nasal cannula O2 Flow Rate (L/min): 2 L/min Pulse Oximetry Type: Intermittent Pain: Pain Assessment Pain Assessment: No/denies pain  See FIM for current functional status  Therapy/Group: Individual Therapy  [name] 08/25/2011, 10:43 AM

## 2011-08-25 NOTE — Progress Notes (Signed)
Patient ID: Cindy Abbott, female   DOB: 01/29/1922, 78 y.o.   MRN: [id number]  Subjective/Complaints: No c/o except pain with caths, Nsg notes vaginal D/C Nursing notes that pt can empty bladder on commode Review of Systems  Genitourinary:       Retention  All other systems reviewed and are negative.     Objective: Vital Signs: Blood pressure 144/57, pulse 61, temperature 98.1 F (36.7 C), temperature source Oral, resp. rate 18, height 5\' 5"  (1.651 m), weight 66.8 kg (147 lb 4.3 oz), SpO2 97.00%. Dg Swallowing Func-no Report  08/23/2011  CLINICAL DATA: r/o aspiration   FLUOROSCOPY FOR SWALLOWING FUNCTION STUDY:  Fluoroscopy was provided for swallowing function study, which was  administered by a speech pathologist.  Final results and recommendations  from this study are contained within the speech pathology report.     Results for orders placed during the hospital encounter of 08/23/11 (from the past 72 hour(s))  GLUCOSE, CAPILLARY     Status: Abnormal   Collection Time   08/23/11 11:29 AM      Component Value Range Comment   Glucose-Capillary 115 (*) 70 - 99 mg/dL   GLUCOSE, CAPILLARY     Status: Normal   Collection Time   08/23/11  5:35 PM      Component Value Range Comment   Glucose-Capillary 90  70 - 99 mg/dL   GLUCOSE, CAPILLARY     Status: Abnormal   Collection Time   08/23/11  9:17 PM      Component Value Range Comment   Glucose-Capillary 146 (*) 70 - 99 mg/dL   CBC WITH DIFFERENTIAL     Status: Abnormal   Collection Time   08/24/11  6:50 AM      Component Value Range Comment   WBC 11.4 (*) 4.0 - 10.5 K/uL    RBC 3.90  3.87 - 5.11 MIL/uL    Hemoglobin 9.5 (*) 12.0 - 15.0 g/dL    HCT 46.9 (*) 62.9 - 46.0 %    MCV 76.7 (*) 78.0 - 100.0 fL    MCH 24.4 (*) 26.0 - 34.0 pg    MCHC 31.8  30.0 - 36.0 g/dL    RDW 52.8 (*) 41.3 - 15.5 %    Platelets 279  150 - 400 K/uL    Neutrophils Relative 81 (*) 43 - 77 %    Neutro Abs 9.3 (*) 1.7 - 7.7 K/uL    Lymphocytes Relative 12  12 -  46 %    Lymphs Abs 1.4  0.7 - 4.0 K/uL    Monocytes Relative 5  3 - 12 %    Monocytes Absolute 0.6  0.1 - 1.0 K/uL    Eosinophils Relative 2  0 - 5 %    Eosinophils Absolute 0.2  0.0 - 0.7 K/uL    Basophils Relative 0  0 - 1 %    Basophils Absolute 0.0  0.0 - 0.1 K/uL   COMPREHENSIVE METABOLIC PANEL     Status: Abnormal   Collection Time   08/24/11  6:50 AM      Component Value Range Comment   Sodium 143  135 - 145 mEq/L    Potassium 5.2 (*) 3.5 - 5.1 mEq/L    Chloride 112  96 - 112 mEq/L    CO2 23  19 - 32 mEq/L    Glucose, Bld 170 (*) 70 - 99 mg/dL    BUN 11  6 - 23 mg/dL    Creatinine, Ser 2.44 (*)  0.50 - 1.10 mg/dL    Calcium 9.0  8.4 - 16.1 mg/dL    Total Protein 6.1  6.0 - 8.3 g/dL    Albumin 2.2 (*) 3.5 - 5.2 g/dL    AST 19  0 - 37 U/L    ALT 17  0 - 35 U/L    Alkaline Phosphatase 99  39 - 117 U/L    Total Bilirubin 0.6  0.3 - 1.2 mg/dL    GFR calc non Af Amer 41 (*) >90 mL/min    GFR calc Af Amer 47 (*) >90 mL/min   GLUCOSE, CAPILLARY     Status: Abnormal   Collection Time   08/24/11  7:36 AM      Component Value Range Comment   Glucose-Capillary 177 (*) 70 - 99 mg/dL    Comment 1 Notify RN     GLUCOSE, CAPILLARY     Status: Normal   Collection Time   08/24/11 12:00 PM      Component Value Range Comment   Glucose-Capillary 70  70 - 99 mg/dL    Comment 1 Notify RN     GLUCOSE, CAPILLARY     Status: Normal   Collection Time   08/24/11 12:34 PM      Component Value Range Comment   Glucose-Capillary 76  70 - 99 mg/dL    Comment 1 Notify RN     GLUCOSE, CAPILLARY     Status: Normal   Collection Time   08/24/11  4:30 PM      Component Value Range Comment   Glucose-Capillary 76  70 - 99 mg/dL    Comment 1 Notify RN     GLUCOSE, CAPILLARY     Status: Abnormal   Collection Time   08/24/11  9:54 PM      Component Value Range Comment   Glucose-Capillary 208 (*) 70 - 99 mg/dL    Comment 1 Notify RN     GLUCOSE, CAPILLARY     Status: Abnormal   Collection Time    08/25/11  7:49 AM      Component Value Range Comment   Glucose-Capillary 143 (*) 70 - 99 mg/dL    Comment 1 Notify RN        HEENT: normal Cardio: RRR Resp: CTA B/L GI: BS positive Extremity:  No Edema Skin:   Intact Neuro: Confused, Flat, Normal Sensory and Abnormal Motor 4/5 on Left side UE and LE Musc/Skel:  Normal   Assessment/Plan: 1. Functional deficits secondary to R ICA infarct with L HP, Mild L neglect which require 3+ hours per day of interdisciplinary therapy in a comprehensive inpatient rehab setting. Physiatrist is providing close team supervision and 24 hour management of active medical problems listed below. Physiatrist and rehab team continue to assess barriers to discharge/monitor patient progress toward functional and medical goals. FIM: FIM - Bathing Bathing Steps Patient Completed: Chest;Left Arm;Front perineal area Bathing: 2: Max-Patient completes 3-4 27f 10 parts or 25-49%  FIM - Upper Body Dressing/Undressing Upper body dressing/undressing steps patient completed: Thread/unthread right sleeve of pullover shirt/dresss Upper body dressing/undressing: 2: Max-Patient completed 25-49% of tasks FIM - Lower Body Dressing/Undressing Lower body dressing/undressing: 1: Total-Patient completed less than 25% of tasks  FIM - Toileting Toileting: 1: Total-Patient completed zero steps, helper did all 3  FIM - Diplomatic Services operational officer Devices: Psychiatrist Transfers: 1-Two helpers;2-To toilet/BSC: Max A (lift and lower assist);2-From toilet/BSC: Max A (lift and lower assist)  FIM - Bed/Chair Transfer  Bed/Chair Transfer Assistive Devices: Bed rails Bed/Chair Transfer: 1: Supine > Sit: Total A (helper does all/Pt. < 25%);1: Sit > Supine: Total A (helper does all/Pt. < 25%);1: Bed > Chair or W/C: Total A (helper does all/Pt. < 25%);1: Chair or W/C > Bed: Total A (helper does all/Pt. < 25%)  FIM - Locomotion: Wheelchair Distance: 150 total A;  patient unable to sequence UE propulsion of w/c despite total verbal, tactile and hand over hand cues Locomotion: Wheelchair: 1: Total Assistance/staff pushes wheelchair (Pt<25%) FIM - Locomotion: Ambulation Ambulation/Gait Assistance: 1: +2 Total assist Locomotion: Ambulation: 1: Two helpers  Comprehension Comprehension Mode: Auditory Comprehension: 2-Understands basic 25 - 49% of the time/requires cueing 51 - 75% of the time  Expression Expression Mode: Verbal Expression: 3-Expresses basic 50 - 74% of the time/requires cueing 25 - 50% of the time. Needs to repeat parts of sentences.  Social Interaction Social Interaction: 4-Interacts appropriately 75 - 89% of the time - Needs redirection for appropriate language or to initiate interaction.  Problem Solving Problem Solving: 2-Solves basic 25 - 49% of the time - needs direction more than half the time to initiate, plan or complete simple activities  Memory Memory: 1-Recognizes or recalls less than 25% of the time/requires cueing greater than 75% of the time  Medical Problem List and Plan:  1. 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 DVT Prophylaxis/Anticoagulation: SCDs. Monitor for signs of DVT. Will consider Lovenox therapy  3. Dysphagia. Dysphagia 1 diet with thin liquids as per modified barium swallow 08/23/2011. Monitor for any signs of aspiration. Followup speech therapy. . Monitor hydration while on thickened liquids  4. Neuropsych: This patient is not capable of making decisions on her own behalf.  5. Diabetes mellitus. Hemoglobin A1c of 8.2. Lantus insulin 10 units each bedtime as well as NovoLog 10 units before lunch and supper and 5 units before breakfast  6. Hypertension. Clonidine patch 0.2 mg weekly,.imdur 30 mg daily, Lopressor 25 mg twice a day. Monitor the increased mobility  7. Escherichia coli UTI. Continue Bactrim  8. CAD status post pacemaker. Patient denies any chest pain.  Continue aspirin therapy 9.  Yeast vaginitis mycostatin  LOS (Days) 2 A FACE TO FACE EVALUATION WAS PERFORMED  KIRSTEINS,ANDREW E 08/25/2011, 8:17 AM

## 2011-08-25 NOTE — Progress Notes (Signed)
Speech Language Pathology Daily Session Note  Patient Details  Name: Cindy Abbott MRN: [id number] Date of Birth: [birth year]  Today's Date: 08/25/2011 Time: 4098-1191 Time Calculation (min): 45 min  Short Term Goals: Week 1: SLP Short Term Goal 1 (Week 1): Patient will sustain attention to basic familiar task for 1 minute with mod assist semantic cues SLP Short Term Goal 2 (Week 1): Patient will attend to left of environment with mod assist semantic cues during functional activities  SLP Short Term Goal 3 (Week 1): Patient will consume Dys.1 textures and thin liquids with max assist verbal and tactile cues with no overt s/s of aspiration SLP Short Term Goal 4 (Week 1): Patient will utilize external aids for orientation with max assist semantic cues SLP Short Term Goal 5 (Week 1): Patient will increase speech intelligibility during sentence level expression with mod assist sematnic cues to utilize compensatory strategies  Skilled Therapeutic Interventions: Treatment session focused on addressing dysphagia and basic problem solving during self-feeding task.  SLP facilitated session with max-total assist verbal, visual and tactile cues to perform chin tuck before swallow while consuming Dys.1 textures and thin liquids, which resulted in effective chin tuck approximately 50% of the time.  As a result, SLP allowed head neutral position throughout second half of meal and provided mod assist semantics to clear throat as needed.  Patient with some spontaneous throat clears indicating improved sensation.  SLP will continue to monitor and make safe swallow recommendations that will reduce burden of care for discharge.  SLP also facilitated session with constant min assist verbal and visual cues to motor plan/sequence putting spoon down before picking up cup.     FIM:  Comprehension Comprehension Mode: Auditory Comprehension: 2-Understands basic 25 - 49% of the time/requires cueing 51 - 75% of the  time Expression Expression Mode: Verbal Expression: 3-Expresses basic 50 - 74% of the time/requires cueing 25 - 50% of the time. Needs to repeat parts of sentences. Social Interaction Social Interaction: 5-Interacts appropriately 90% of the time - Needs monitoring or encouragement for participation or interaction. Problem Solving Problem Solving: 4-Solves basic 75 - 89% of the time/requires cueing 10 - 24% of the time Memory Memory: 1-Recognizes or recalls less than 25% of the time/requires cueing greater than 75% of the time FIM - Eating Eating Activity: 4: Help with managing cup/glass  Pain Pain Assessment Pain Assessment: No/denies pain  Therapy/Group: Individual Therapy  Charlane Ferretti., CCC-SLP [phone]  [employer] 08/25/2011, 1:39 PM

## 2011-08-25 NOTE — Progress Notes (Signed)
Physical Therapy Session Note  Patient Details  Name: Cindy Abbott MRN: [id number] Date of Birth: 06-08-1922  Today's Date: 08/25/2011 Time: 4098-1191 Time Calculation (min): 56 min  Short Term Goals: Week 1:  PT Short Term Goal 1 (Week 1): Patient will perform bed mobility flat bed to L and R with mod A PT Short Term Goal 2 (Week 1): Patient will perform bed <> w/c transfers to L and R with mod A PT Short Term Goal 3 (Week 1): Patient will tolerate dynamic and static sitting balance and trunk control training without back support and mod A x 5-10 minutes PT Short Term Goal 4 (Week 1): Patient will perform standing balance and pre gait training with max A x 5 minutes PT Short Term Goal 5 (Week 1): Patient will perform gait x 25' with max A and LRAD in controlled environment  Therapy Documentation Precautions:  Precautions Precautions: Fall Precaution Comments: Dys 1, Nectar thick liquids, TEDs donned OOB Restrictions Weight Bearing Restrictions: No Vital Signs: Therapy Vitals Temp: 98.1 F (36.7 C) Temp src: Oral Pulse Rate: 153  Resp: 18  BP: 186/94 mmHg Patient Position, if appropriate: Sitting Oxygen Therapy SpO2: 83 % O2 Device: None (Room air) Pulse Oximetry Type: Intermittent Pain: Pain Assessment Pain Assessment: No/denies pain Mobility:  Patient performed rolling in flat bed with bed rails and total verbal, tactile and visual cues with max A for full L upper and lower trunk rotation to R; side > sit EOB with max A; transfers bed > w/c <> mat with stand pivot with bilat UE support on therapist with max A for lifting and lowering and sequencing.  Sitting balance, trunk control training on mat with tactile cues for anterior pelvic tilt to bring COG forward for WB through feet and maintaining during forward and R lateral reaching out of BOS for increase forward and R lateral weight shift and trunk excursion.  Cued to maintain lumbar lordosis during anterior lean for sit > stand  with mod A.    Gait training x 10' with EVA walker for more upright posture and max A for LLE full advancement and full hip and knee extension for stability in stance.  Performed stair negotiation up and down 3 steps with 2 rails and max-total A for sequencing; patient chose to ascend with LLE and descend with LLE but required total assist for hip and knee extension for stability in stance to maintain upright trunk when descending.  Patient very SOB after stairs, assessed vitals and returned patient to room and RN alerted who administered 3L02 via [place].  Once given 02 patient Sp02 increased to 100% and HR decreased to 60 bpm.  Locomotion : Ambulation Ambulation/Gait Assistance: 2: Max assist   See FIM for current functional status  Therapy/Group: Individual Therapy  Edman Circle [facility] 08/25/2011, 9:05 AM

## 2011-08-25 NOTE — Progress Notes (Signed)
Occupational Therapy Session Note  Patient Details  Name: Sharyon Peitz MRN: [id number] Date of Birth: [birth year]  Today's Date: 08/25/2011 Time: 1330-1400 Time Calculation (min): 30 min  Short Term Goals: Week 1:  OT Short Term Goal 1 (Week 1): Pt will complete UB bathing with min assist OT Short Term Goal 2 (Week 1): Pt will complete LB bathing with mod assist OT Short Term Goal 3 (Week 1): Pt will complete UB dressing with min assist OT Short Term Goal 4 (Week 1): Pt will complete LB dressing with mod assist OT Short Term Goal 5 (Week 1): Pt will complete toilet transfer with mod assist  Skilled Therapeutic Interventions/Progress Updates:    Patient seen for 1:1 OT session to address postural control during functional transitions, e.g. Sit to stand, sit to squat, scooting, and standing weight shifts as needed for basic self care tasks.  Patient able to sustain adequate O2 reading with minimal activity, reporting only a feeling of fatigue at end of session.   Therapy Documentation Precautions:  Precautions Precautions: Fall Precaution Comments: Dys 1, Nectar thick liquids, TEDs donned OOB Restrictions Weight Bearing Restrictions: No   Vital Signs: O2 Sats @ 95% on room air with minimal activity  Pain Assessment Pain Assessment: No/denies pain    See FIM for current functional status  Therapy/Group: Individual Therapy  Collier Salina 08/25/2011, 5:18 PM

## 2011-08-26 ENCOUNTER — Inpatient Hospital Stay (HOSPITAL_COMMUNITY): Payer: Medicare Other | Admitting: Occupational Therapy

## 2011-08-26 ENCOUNTER — Inpatient Hospital Stay (HOSPITAL_COMMUNITY): Payer: Medicare Other | Admitting: Speech Pathology

## 2011-08-26 ENCOUNTER — Inpatient Hospital Stay (HOSPITAL_COMMUNITY): Payer: Medicare Other | Admitting: Physical Therapy

## 2011-08-26 DIAGNOSIS — Z5189 Encounter for other specified aftercare: Secondary | ICD-10-CM

## 2011-08-26 DIAGNOSIS — I6330 Cerebral infarction due to thrombosis of unspecified cerebral artery: Secondary | ICD-10-CM

## 2011-08-26 LAB — GLUCOSE, CAPILLARY
Glucose-Capillary: 114 mg/dL — ABNORMAL HIGH (ref 70–99)
Glucose-Capillary: 50 mg/dL — ABNORMAL LOW (ref 70–99)
Glucose-Capillary: 170 mg/dL — ABNORMAL HIGH (ref 70–99)

## 2011-08-26 MED ORDER — GLUCERNA SHAKE PO LIQD
237.0000 mL | ORAL | Status: DC
  Administered 2011-08-26 – 2011-09-05 (×11): 237 mL via ORAL
  Filled 2011-08-26: qty 237

## 2011-08-26 MED ORDER — HYDROCERIN EX CREA
TOPICAL_CREAM | Freq: Two times a day (BID) | CUTANEOUS | Status: DC
Start: 2011-08-26 — End: 2011-09-14
  Administered 2011-08-26 (×2): via TOPICAL
  Administered 2011-08-27: 1 via TOPICAL
  Administered 2011-08-27 – 2011-09-13 (×33): via TOPICAL
  Administered 2011-09-13: 1 via TOPICAL
  Administered 2011-09-14: 08:00:00 via TOPICAL
  Filled 2011-08-26 (×3): qty 113

## 2011-08-26 MED ORDER — CLOTRIMAZOLE 1 % VA CREA
1.0000 | TOPICAL_CREAM | Freq: Every day | VAGINAL | Status: AC
  Administered 2011-08-26 – 2011-09-01 (×7): 1 via VAGINAL
  Filled 2011-08-26 (×2): qty 45

## 2011-08-26 MED ORDER — ENSURE PUDDING PO PUDG
1.0000 | Freq: Three times a day (TID) | ORAL | Status: DC
  Administered 2011-08-26 – 2011-09-08 (×35): 1 via ORAL

## 2011-08-26 NOTE — Plan of Care (Signed)
Problem: RH BLADDER ELIMINATION Goal: RH STG MANAGE BLADDER WITH ASSISTANCE STG Manage Bladder With minimal Assistance  Assist pt. OOB for all toileting needs, attempts to void.

## 2011-08-26 NOTE — Progress Notes (Signed)
Speech Language Pathology Daily Session Note  Patient Details  Name: Cindy Abbott MRN: [id number] Date of Birth: 1922/07/21  Today's Date: 08/26/2011 Time: 1200-1230 Time Calculation (min): 30 min  Short Term Goals: Week 1: SLP Short Term Goal 1 (Week 1): Patient will sustain attention to basic familiar task for 1 minute with mod assist semantic cues SLP Short Term Goal 2 (Week 1): Patient will attend to left of environment with mod assist semantic cues during functional activities  SLP Short Term Goal 3 (Week 1): Patient will consume Dys.1 textures and thin liquids with max assist verbal and tactile cues with no overt s/s of aspiration SLP Short Term Goal 4 (Week 1): Patient will utilize external aids for orientation with max assist semantic cues SLP Short Term Goal 5 (Week 1): Patient will increase speech intelligibility during sentence level expression with mod assist sematnic cues to utilize compensatory strategies  Skilled Therapeutic Interventions: Co-treatment with OT; SLP facilitated session with supervision verbal cues to utilize small bites and sips as well as to empty mouth prior to taking another bite.  SLP also facilitated session with min assist semantic cues to swallow after throat clears to remove suspected penetrates. Patient requested to use a straw at lunch after SLP educated her on rationale as to why it was not recommended patient verbalized understanding but requested a straw again about 1 minute later.   FIM:  Comprehension Comprehension Mode: Auditory Comprehension: 2-Understands basic 25 - 49% of the time/requires cueing 51 - 75% of the time Expression Expression Mode: Verbal Expression: 3-Expresses basic 50 - 74% of the time/requires cueing 25 - 50% of the time. Needs to repeat parts of sentences. Social Interaction Social Interaction: 5-Interacts appropriately 90% of the time - Needs monitoring or encouragement for participation or interaction. Problem  Solving Problem Solving: 2-Solves basic 25 - 49% of the time - needs direction more than half the time to initiate, plan or complete simple activities Memory Memory: 1-Recognizes or recalls less than 25% of the time/requires cueing greater than 75% of the time FIM - Eating Eating Activity: 4: Help with managing cup/glass  Pain Pain Assessment Pain Assessment: No/denies pain  Therapy/Group: Group Therapy  Charlane Ferretti., CCC-SLP [phone]  Cindy Abbott 08/26/2011, 1:39 PM

## 2011-08-26 NOTE — Progress Notes (Signed)
Occuaptional Therapy Note/[employer]  Patient Details  Name: Cindy Abbott MRN: [id number] Date of Birth: Jun 02, 1922 Today's Date: 08/26/2011 Time: 1130-1200 (30 min)  Pt seen for group session, [employer], with focus on self-feeding, attention to Lt body and environment, and attention to task.  Pt required min cues for setup assist and min-mod cues for attention to Lt side of plate.  Utilized turning of plate to promote attention to each food item.  Pt with appropriate self-feeding with Rt hand with occasional cues for completion of task.  Pt with no c/o pain this session.  [name] 08/26/2011, 1:08 PM

## 2011-08-26 NOTE — Progress Notes (Signed)
Patient ID: Cindy Abbott, female   DOB: 1922-12-05, 78 y.o.   MRN: [id number]  Subjective/Complaints: Denies any discomfort this morning. Reports of a good night sleep Nursing notes that pt can empty bladder on commode Review of Systems  Genitourinary:       Retention  All other systems reviewed and are negative.     Objective: Vital Signs: Blood pressure 147/69, pulse 60, temperature 97.6 F (36.4 C), temperature source Oral, resp. rate 18, height 5\' 5"  (1.651 m), weight 69.2 kg (152 lb 8.9 oz), SpO2 100.00%. Dg Chest 2 View  08/25/2011  *RADIOLOGY REPORT*  Clinical Data: Hypoxia.  Asthma and CHF  CHEST - 2 VIEW  Comparison: 08/22/2011  Findings: There is a left chest wall pacer device with lead in the right ventricle. The heart size is moderately enlarged.   Interval improvement in small pleural effusions.  Chronic interstitial coarsening appears similar to previous exam.  Review of the visualized osseous structures is unremarkable.  IMPRESSION: Interval decrease in the small pleural effusions consistent with improving CHF.   Original Report Authenticated By: Rosealee Albee, M.D.    Results for orders placed during the hospital encounter of 08/23/11 (from the past 72 hour(s))  GLUCOSE, CAPILLARY     Status: Abnormal   Collection Time   08/23/11 11:29 AM      Component Value Range Comment   Glucose-Capillary 115 (*) 70 - 99 mg/dL   GLUCOSE, CAPILLARY     Status: Normal   Collection Time   08/23/11  5:35 PM      Component Value Range Comment   Glucose-Capillary 90  70 - 99 mg/dL   GLUCOSE, CAPILLARY     Status: Abnormal   Collection Time   08/23/11  9:17 PM      Component Value Range Comment   Glucose-Capillary 146 (*) 70 - 99 mg/dL   CBC WITH DIFFERENTIAL     Status: Abnormal   Collection Time   08/24/11  6:50 AM      Component Value Range Comment   WBC 11.4 (*) 4.0 - 10.5 K/uL    RBC 3.90  3.87 - 5.11 MIL/uL    Hemoglobin 9.5 (*) 12.0 - 15.0 g/dL    HCT 62.1 (*) 30.8 - 46.0 %    MCV 76.7 (*) 78.0 - 100.0 fL    MCH 24.4 (*) 26.0 - 34.0 pg    MCHC 31.8  30.0 - 36.0 g/dL    RDW 65.7 (*) 84.6 - 15.5 %    Platelets 279  150 - 400 K/uL    Neutrophils Relative 81 (*) 43 - 77 %    Neutro Abs 9.3 (*) 1.7 - 7.7 K/uL    Lymphocytes Relative 12  12 - 46 %    Lymphs Abs 1.4  0.7 - 4.0 K/uL    Monocytes Relative 5  3 - 12 %    Monocytes Absolute 0.6  0.1 - 1.0 K/uL    Eosinophils Relative 2  0 - 5 %    Eosinophils Absolute 0.2  0.0 - 0.7 K/uL    Basophils Relative 0  0 - 1 %    Basophils Absolute 0.0  0.0 - 0.1 K/uL   COMPREHENSIVE METABOLIC PANEL     Status: Abnormal   Collection Time   08/24/11  6:50 AM      Component Value Range Comment   Sodium 143  135 - 145 mEq/L    Potassium 5.2 (*) 3.5 - 5.1 mEq/L  Chloride 112  96 - 112 mEq/L    CO2 23  19 - 32 mEq/L    Glucose, Bld 170 (*) 70 - 99 mg/dL    BUN 11  6 - 23 mg/dL    Creatinine, Ser 4.09 (*) 0.50 - 1.10 mg/dL    Calcium 9.0  8.4 - 81.1 mg/dL    Total Protein 6.1  6.0 - 8.3 g/dL    Albumin 2.2 (*) 3.5 - 5.2 g/dL    AST 19  0 - 37 U/L    ALT 17  0 - 35 U/L    Alkaline Phosphatase 99  39 - 117 U/L    Total Bilirubin 0.6  0.3 - 1.2 mg/dL    GFR calc non Af Amer 41 (*) >90 mL/min    GFR calc Af Amer 47 (*) >90 mL/min   GLUCOSE, CAPILLARY     Status: Abnormal   Collection Time   08/24/11  7:36 AM      Component Value Range Comment   Glucose-Capillary 177 (*) 70 - 99 mg/dL    Comment 1 Notify RN     GLUCOSE, CAPILLARY     Status: Normal   Collection Time   08/24/11 12:00 PM      Component Value Range Comment   Glucose-Capillary 70  70 - 99 mg/dL    Comment 1 Notify RN     GLUCOSE, CAPILLARY     Status: Normal   Collection Time   08/24/11 12:34 PM      Component Value Range Comment   Glucose-Capillary 76  70 - 99 mg/dL    Comment 1 Notify RN     GLUCOSE, CAPILLARY     Status: Normal   Collection Time   08/24/11  4:30 PM      Component Value Range Comment   Glucose-Capillary 76  70 - 99 mg/dL    Comment  1 Notify RN     GLUCOSE, CAPILLARY     Status: Abnormal   Collection Time   08/24/11  9:54 PM      Component Value Range Comment   Glucose-Capillary 208 (*) 70 - 99 mg/dL    Comment 1 Notify RN     GLUCOSE, CAPILLARY     Status: Abnormal   Collection Time   08/25/11  7:49 AM      Component Value Range Comment   Glucose-Capillary 143 (*) 70 - 99 mg/dL    Comment 1 Notify RN     GLUCOSE, CAPILLARY     Status: Abnormal   Collection Time   08/25/11 11:30 AM      Component Value Range Comment   Glucose-Capillary 148 (*) 70 - 99 mg/dL    Comment 1 Notify RN     GLUCOSE, CAPILLARY     Status: Abnormal   Collection Time   08/25/11  5:02 PM      Component Value Range Comment   Glucose-Capillary 100 (*) 70 - 99 mg/dL   GLUCOSE, CAPILLARY     Status: Abnormal   Collection Time   08/25/11  9:05 PM      Component Value Range Comment   Glucose-Capillary 168 (*) 70 - 99 mg/dL    Comment 1 Notify RN        HEENT: normal Cardio: RRR Resp: CTA B/L GI: BS positive Extremity:  No Edema Skin:   Intact Neuro: Confused, Flat, Normal Sensory and Abnormal Motor 4/5 on Left side UE and LE Musc/Skel:  Normal Skin is  quite dry  Assessment/Plan: 1. Functional deficits secondary to R ICA infarct with L HP, Mild L neglect which require 3+ hours per day of interdisciplinary therapy in a comprehensive inpatient rehab setting. Physiatrist is providing close team supervision and 24 hour management of active medical problems listed below. Physiatrist and rehab team continue to assess barriers to discharge/monitor patient progress toward functional and medical goals. FIM: FIM - Bathing Bathing Steps Patient Completed: Chest;Right Arm;Left Arm;Abdomen Bathing: 2: Max-Patient completes 3-4 10f 10 parts or 25-49%  FIM - Upper Body Dressing/Undressing Upper body dressing/undressing steps patient completed: Thread/unthread right sleeve of pullover shirt/dresss Upper body dressing/undressing: 0: Wears  gown/pajamas-no public clothing FIM - Lower Body Dressing/Undressing Lower body dressing/undressing: 0: Wears gown/pajamas-no public clothing  FIM - Toileting Toileting: 1: Total-Patient completed zero steps, helper did all 3  FIM - Diplomatic Services operational officer Devices: Psychiatrist Transfers: 1-Two helpers;2-To toilet/BSC: Max A (lift and lower assist);2-From toilet/BSC: Max A (lift and lower assist)  FIM - Banker Devices: Bed rails Bed/Chair Transfer: 2: Supine > Sit: Max A (lifting assist/Pt. 25-49%);2: Bed > Chair or W/C: Max A (lift and lower assist);2: Chair or W/C > Bed: Max A (lift and lower assist)  FIM - Locomotion: Wheelchair Distance: 150 total A; patient unable to sequence UE propulsion of w/c despite total verbal, tactile and hand over hand cues Locomotion: Wheelchair: 1: Total Assistance/staff pushes wheelchair (Pt<25%) FIM - Locomotion: Ambulation Locomotion: Ambulation Assistive Devices: Fara Boros Ambulation/Gait Assistance: 2: Max assist Locomotion: Ambulation: 1: Travels less than 50 ft with maximal assistance (Pt: 25 - 49%)  Comprehension Comprehension Mode: Auditory Comprehension: 2-Understands basic 25 - 49% of the time/requires cueing 51 - 75% of the time  Expression Expression Mode: Verbal Expression: 3-Expresses basic 50 - 74% of the time/requires cueing 25 - 50% of the time. Needs to repeat parts of sentences.  Social Interaction Social Interaction: 5-Interacts appropriately 90% of the time - Needs monitoring or encouragement for participation or interaction.  Problem Solving Problem Solving: 4-Solves basic 75 - 89% of the time/requires cueing 10 - 24% of the time  Memory Memory: 1-Recognizes or recalls less than 25% of the time/requires cueing greater than 75% of the time  Medical Problem List and Plan:  1. 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 DVT Prophylaxis/Anticoagulation: SCDs. Monitor for signs of DVT. Will consider Lovenox therapy  3. Dysphagia. Dysphagia 1 diet with thin liquids as per modified barium swallow 08/23/2011. Monitor for any signs of aspiration. Followup speech therapy. . Monitor hydration  . Latest creatinine 1.18 4. Neuropsych: This patient is not capable of making decisions on her own behalf.  5. Diabetes mellitus. Hemoglobin A1c of 8.2. Lantus insulin 10 units each bedtime as well as NovoLog 10 units before lunch and supper and 5 units before breakfast . Blood sugars currently controlled with latest of 148-100-168 6. Hypertension. Clonidine patch 0.2 mg weekly,.imdur 30 mg daily, Lopressor 25 mg twice a day. Monitor the increased mobility . Patient denies any dizziness when up for therapies. No chest pain or shortness of breath 7. Escherichia coli UTI. Continue Bactrim  Which was initiated 08/23/2011. No dysuria noted 8. CAD status post pacemaker. Patient denies any chest pain. Continue aspirin therapy 9.  Yeast vaginitis mycostatin  LOS (Days) 3 A FACE TO FACE EVALUATION WAS PERFORMED  ANGIULLI,DANIEL J. 08/26/2011, 6:41 AM

## 2011-08-26 NOTE — Progress Notes (Signed)
Speech Language Pathology Daily Session Note  Patient Details  Name: Jenesys Casseus MRN: [id number] Date of Birth: 02/20/1922  Today's Date: 08/26/2011 Time: 1000-1030 Time Calculation (min): 30 min  Short Term Goals: Week 1: SLP Short Term Goal 1 (Week 1): Patient will sustain attention to basic familiar task for 1 minute with mod assist semantic cues SLP Short Term Goal 2 (Week 1): Patient will attend to left of environment with mod assist semantic cues during functional activities  SLP Short Term Goal 3 (Week 1): Patient will consume Dys.1 textures and thin liquids with max assist verbal and tactile cues with no overt s/s of aspiration SLP Short Term Goal 4 (Week 1): Patient will utilize external aids for orientation with max assist semantic cues SLP Short Term Goal 5 (Week 1): Patient will increase speech intelligibility during sentence level expression with mod assist sematnic cues to utilize compensatory strategies  Skilled Therapeutic Interventions: Treatment session focused on addressing dysphagia goals.  SLP facilitated session with max assist verbal and tactile cues to problem solve basic food prep for a drink and mod assist semantic cues to request help as needed.  SLP also facilitated session with no cues and patient spontaneously utilized head neutral position with cup sips.  SLP allowed for increased wait time and with that patient spontaneously used intermittent throat clears and 2 swallows.  At this time it is recommended that patient's use of compensatory strategies be modified from chin tuck to throat clear in an attempt to reduce burden of care upon discharge.  SLP will continue to follow to ensure toleration and safety prior to discharge.    FIM:  Comprehension Comprehension Mode: Auditory Comprehension: 2-Understands basic 25 - 49% of the time/requires cueing 51 - 75% of the time Expression Expression Mode: Verbal Expression: 3-Expresses basic 50 - 74% of the time/requires  cueing 25 - 50% of the time. Needs to repeat parts of sentences. Social Interaction Social Interaction: 5-Interacts appropriately 90% of the time - Needs monitoring or encouragement for participation or interaction. Problem Solving Problem Solving: 2-Solves basic 25 - 49% of the time - needs direction more than half the time to initiate, plan or complete simple activities Memory Memory: 1-Recognizes or recalls less than 25% of the time/requires cueing greater than 75% of the time FIM - Eating Eating Activity: 4: Help with managing cup/glass  Pain Pain Assessment Pain Assessment: No/denies pain  Therapy/Group: Individual Therapy  Charlane Ferretti., CCC-SLP [phone]  Devynne Sturdivant 08/26/2011, 1:27 PM

## 2011-08-26 NOTE — Progress Notes (Signed)
Occupational Therapy Session Note  Patient Details  Name: Cindy Abbott MRN: [id number] Date of Birth: [birth year]  Today's Date: 08/26/2011 Time: 1030-1125 Time Calculation (min): 55 min  Short Term Goals: Week 1:  OT Short Term Goal 1 (Week 1): Pt will complete UB bathing with min assist OT Short Term Goal 2 (Week 1): Pt will complete LB bathing with mod assist OT Short Term Goal 3 (Week 1): Pt will complete UB dressing with min assist OT Short Term Goal 4 (Week 1): Pt will complete LB dressing with mod assist OT Short Term Goal 5 (Week 1): Pt will complete toilet transfer with mod assist  Skilled Therapeutic Interventions/Progress Updates:    Pt seen for ADL retraining at sink with focus on sit to stand.  Pt max assist with sit to stand, able to maintain standing for 1-2 mins with min assist while completing bathing of periarea and pulling up pants.  Pt with increased attention to following directions with bathing and dressing.  Pt required multiple rest breaks secondary to fatigue and occasional wheezing.  Occasional monitoring of O2 with >90% and HR in mid 60s.  Therapy Documentation Precautions:  Precautions Precautions: Fall Precaution Comments: Dys 1, Nectar thick liquids, TEDs donned OOB Restrictions Weight Bearing Restrictions: No Pain:   Pt with no c/o pain this session.  See FIM for current functional status  Therapy/Group: Individual Therapy  [name] 08/26/2011, 1:05 PM

## 2011-08-26 NOTE — Progress Notes (Signed)
Physical Therapy Session Note  Patient Details  Name: Cindy Abbott MRN: [id number] Date of Birth: [birth year]  Today's Date: 08/26/2011 Time: 0803-0903 Time Calculation (min): 60 min  Short Term Goals: Week 1:  PT Short Term Goal 1 (Week 1): Patient will perform bed mobility flat bed to L and R with mod A PT Short Term Goal 2 (Week 1): Patient will perform bed <> w/c transfers to L and R with mod A PT Short Term Goal 3 (Week 1): Patient will tolerate dynamic and static sitting balance and trunk control training without back support and mod A x 5-10 minutes PT Short Term Goal 4 (Week 1): Patient will perform standing balance and pre gait training with max A x 5 minutes PT Short Term Goal 5 (Week 1): Patient will perform gait x 25' with max A and LRAD in controlled environment  Skilled Therapeutic Interventions/Progress Updates:   Bed mobility on flat bed, no rails with max A and total verbal and tactile cues for rolling sequence and for full upper and lower trunk rotation to R side; side > sit upright on bed with max A.  Multiple sit<> stand and stand pivot transfers bed <> w/c <> toilet and w/c <> mat transfers with UE support on therapist's shoulders with mod-max A and tactile and verbal cues for trunk elongation during forward lean to bring COG over BOS during sit > stand and for full pivoting prior to sitting.  Required min A during toileting to maintain balance on toilet secondary to LOB to L while on toilet.  Assistance for hygiene in standing.  Performed trunk control, postural control and righting reaction training on mat with feet supported during resisted forward and posterior leans with UE on therapist's shoulders, L and R lateral leans to elbows while other UE reaching in various directions forward, upwards and across midline for active trunk elongation, rotation and forward/lateral weight shifting with min-mod A to maintain L trunk elongation. Patient became very fatigued at end of session  with increased difficulty maintaining upright trunk and increased L LOB; able to maintain Sp02 >90% on RA.   Therapy Documentation Precautions:  Precautions Precautions: Fall Precaution Comments: Dys 1, Nectar thick liquids, TEDs donned OOB Restrictions Weight Bearing Restrictions: No Vital Signs: Therapy Vitals Temp: 97.6 F (36.4 C) Temp src: Oral Pulse Rate: 63  Resp: 18  BP: 147/69 mmHg Patient Position, if appropriate: Lying Oxygen Therapy SpO2: 93 % O2 Device: None (Room air) Pain: Pain Assessment Pain Assessment: No/denies pain  See FIM for current functional status  Therapy/Group: Individual Therapy  Edman Circle [facility] 08/26/2011, 9:04 AM

## 2011-08-26 NOTE — Progress Notes (Signed)
INITIAL ADULT NUTRITION ASSESSMENT Date: 08/26/2011   Time: 3:40 PM  Reason for Assessment: Poor PO Intake  ASSESSMENT: Female 78 y.o.  Dx: CVA (cerebral infarction)  Hx:  Past Medical History  Diagnosis Date  . Coronary artery disease   . Diabetes mellitus   . Pacemaker   . Asthma   . Stroke, acute, thrombotic 08/19/2011   Past Surgical History  Procedure Date  . Cardiac surgery   . Coronary angioplasty with stent placement   . Coronary artery bypass graft   . Abdominal hysterectomy   . Colon polpys    Related Meds:     . ipratropium  0.5 mg Nebulization BID   And  . albuterol  2.5 mg Nebulization BID  . antiseptic oral rinse  15 mL Mouth Rinse QID  . aspirin  325 mg Oral Daily  . cloNIDine  0.2 mg Transdermal Weekly  . clotrimazole  1 Applicatorful Vaginal QHS  . hydrocerin   Topical BID  . insulin aspart  10 Units Subcutaneous BID AC  . insulin aspart  5 Units Subcutaneous QAC breakfast  . insulin glargine  10 Units Subcutaneous QHS  . isosorbide mononitrate  30 mg Oral Daily  . latanoprost  1 drop Both Eyes QHS  . metoprolol  25 mg Oral BID  . pantoprazole  40 mg Oral Q breakfast  . sulfamethoxazole-trimethoprim  1 tablet Oral Q12H   Ht: 5\' 5"  (165.1 cm)  Wt: 152 lb 8.9 oz (69.2 kg)  Ideal Wt: 56.8 kg % Ideal Wt: 121%  Wt Readings from Last 15 Encounters:  08/25/11 152 lb 8.9 oz (69.2 kg)  08/23/11 155 lb 6.8 oz (70.5 kg)  Usual Wt: pt unable to state % Usual Wt: n/a  Body mass index is 25.39 kg/(m^2). Pt is overweight.  Food/Nutrition Related Hx: unable to obtain  Labs:  CMP     Component Value Date/Time   NA 143 08/24/2011 0650   K 5.2* 08/24/2011 0650   CL 112 08/24/2011 0650   CO2 23 08/24/2011 0650   GLUCOSE 170* 08/24/2011 0650   BUN 11 08/24/2011 0650   CREATININE 1.18* 08/24/2011 0650   CALCIUM 9.0 08/24/2011 0650   PROT 6.1 08/24/2011 0650   ALBUMIN 2.2* 08/24/2011 0650   AST 19 08/24/2011 0650   ALT 17 08/24/2011 0650   ALKPHOS 99  08/24/2011 0650   BILITOT 0.6 08/24/2011 0650   GFRNONAA 41* 08/24/2011 0650   GFRAA 47* 08/24/2011 0650   Sodium  Date/Time Value Range Status  08/24/2011  6:50 AM 143  135 - 145 mEq/L Final  08/23/2011  5:52 AM 143  135 - 145 mEq/L Final  08/19/2011  4:44 AM 141  135 - 145 mEq/L Final    Potassium  Date/Time Value Range Status  08/24/2011  6:50 AM 5.2* 3.5 - 5.1 mEq/L Final  08/23/2011  5:52 AM 4.9  3.5 - 5.1 mEq/L Final  08/20/2011 12:01 AM 4.1  3.5 - 5.1 mEq/L Final    No results found for this basename: phos    Magnesium  Date/Time Value Range Status  08/19/2011  9:17 AM 1.6  1.5 - 2.5 mg/dL Final   Lab Results  Component Value Date   HGBA1C 8.2* 08/20/2011     Intake/Output Summary (Last 24 hours) at 08/26/11 1542 Last data filed at 08/26/11 0915  Gross per 24 hour  Intake    240 ml  Output    200 ml  Net     40 ml  Diet Order: Dysphagia 1 with thin liquids  Supplements/Tube Feeding: none  IVF:    Estimated Nutritional Needs:   Kcal: 1450 - 1600 kcal Protein: 65 - 75 grams Fluid:  1.5 - 1.7 liters daily  Initial cranial CT scan showed age-related atrophy without acute change. Patient did receive TPA. Full workup is ongoing per neurology services. Bedside swallowing evaluation placed on dysphagia 1 pureed nectar thick liquid diet with formal swallow study completed 08/23/2011 advised to continue pured 1 consistency but can advance to thin liquids.  RD attempted to obtain nutritional hx from patient, however pt confused. Pt telling this RD that she's been having bad dreams and asking what's wrong with her?  Pt consuming 10 - 50% of meals. Continues to work with SLP for swallowing strategies. Pt is at nutrition risk given advanced age and acute medical issues.  NUTRITION DIAGNOSIS: -Inadequate oral intake (NI-2.1).  Status: Ongoing  RELATED TO: variable appetite and mental status  AS EVIDENCE BY: variable intake  MONITORING/EVALUATION(Goals): Goal: Pt to meet  >/= 90% of their estimated nutrition needs Monitor: weights, labs, PO intake, supplement acceptance  EDUCATION NEEDS: -No education needs identified at this time  INTERVENTION: 1. MVI 1 tab PO daily 2. Ensure Pudding po TID, each supplement provides 170 kcal and 4 grams of protein.  3. Glucerna Shake po daily, each supplement provides 220 kcal and 10 grams of protein. 4. RD to continue to follow nutrition care plan   DOCUMENTATION CODES Per approved criteria  -Not Applicable   Jarold Motto MS, RD, LDN Pager: [phone] After-hours pager: [phone]

## 2011-08-26 NOTE — Significant Event (Signed)
Hypoglycemic Event  CBG: 50  Treatment: 15 GM carbohydrate snack  Symptoms: None  Follow-up CBG: Time:1635 CBG Result:60  Possible Reasons for Event: Unknown  Comments/MD notified:pt given 4 ounces of juice, will check recheck in fifteen minutes. Recheck 60 will do another 15GM snack and recheck.    Abbott, Cindy Venard Elon Jester  Remember to initiate Hypoglycemia Order Set & complete

## 2011-08-26 NOTE — Progress Notes (Signed)
Pt did not require I/O caths this shift; OOB to [facility] for all toileting needs, attempts to void.  Scanned for 26cc and 88cc after voids of 300-250cc.  O2 sats remained at 98-100% on RA at rest. ( see PT note) Carlean Purl

## 2011-08-26 NOTE — Significant Event (Signed)
hypoHypoglycemic Event  CBG: 60  Treatment: 15 GM carbohydrate snack  Symptoms: None  Follow-up CBG: Time:1655 CBG Result:84  Possible Reasons for Event: Unknown  Comments/MD notified: Applesauce given to patient, will recheck in fifteen minutes, spoke with Harvel Ricks, Pa via phone instructed to hold this pm dose of Novolog and recheck CBG at routine time tonight, pt currently eating supper.   Roberts-VonCannon, Emmalise Huard Elon Jester  Remember to initiate Hypoglycemia Order Set & complete

## 2011-08-27 ENCOUNTER — Inpatient Hospital Stay (HOSPITAL_COMMUNITY): Payer: Medicare Other | Admitting: Occupational Therapy

## 2011-08-27 ENCOUNTER — Inpatient Hospital Stay (HOSPITAL_COMMUNITY): Payer: Medicare Other | Admitting: Physical Therapy

## 2011-08-27 ENCOUNTER — Inpatient Hospital Stay (HOSPITAL_COMMUNITY): Payer: Medicare Other | Admitting: Speech Pathology

## 2011-08-27 LAB — GLUCOSE, CAPILLARY
Glucose-Capillary: 153 mg/dL — ABNORMAL HIGH (ref 70–99)
Glucose-Capillary: 93 mg/dL (ref 70–99)

## 2011-08-27 LAB — BASIC METABOLIC PANEL
GFR calc Af Amer: 43 mL/min — ABNORMAL LOW (ref >90)
GFR calc non Af Amer: 37 mL/min — ABNORMAL LOW (ref >90)
Potassium: 4.8 mEq/L (ref 3.5–5.1)
Sodium: 135 mEq/L (ref 135–145)

## 2011-08-27 NOTE — Progress Notes (Signed)
Occupational Therapy Session Note  Patient Details  Name: Cindy Abbott MRN: [id number] Date of Birth: 1922/11/01  Today's Date: 08/27/2011 Time: 2841-3244 Time Calculation (min): 45 min  Short Term Goals: Week 1:  OT Short Term Goal 1 (Week 1): Pt will complete UB bathing with min assist OT Short Term Goal 2 (Week 1): Pt will complete LB bathing with mod assist OT Short Term Goal 3 (Week 1): Pt will complete UB dressing with min assist OT Short Term Goal 4 (Week 1): Pt will complete LB dressing with mod assist OT Short Term Goal 5 (Week 1): Pt will complete toilet transfer with mod assist  Skilled Therapeutic Interventions/Progress Updates:    Pt seen for ADL retraining at sink with focus on sitting balance, sit <> stand, and LB dressing.  Pt in bed upon arrival, engaged in bed mobility.  Pt opted not to bathe, but engaged in dressing.  Pt required assist with buttoning shirt secondary to decreased attention to and coordination of LUE.  Pt required multiple rest breaks during dressing.  Pt O2 dropped to 84% with dressing task in sitting, rest break 1-2 mins with O2 returning to mid 90s.  Engaged in education on pursed lip breathing and breathing in through nose.    Therapy Documentation Precautions:  Precautions Precautions: Fall Precaution Comments: Dys 1, Nectar thick liquids, TEDs donned OOB Restrictions Weight Bearing Restrictions: No Pain:   Pt with no c/o pain this session.  See FIM for current functional status  Therapy/Group: Individual Therapy  [name] 08/27/2011, 12:25 PM

## 2011-08-27 NOTE — Progress Notes (Signed)
Speech Language Pathology Daily Session Note  Patient Details  Name: Cindy Abbott MRN: [id number] Date of Birth: January 20, 1922  Today's Date: 08/27/2011 Time: 1200-1215 Time Calculation (min): 15 min  Short Term Goals: Week 1: SLP Short Term Goal 1 (Week 1): Patient will sustain attention to basic familiar task for 1 minute with mod assist semantic cues SLP Short Term Goal 2 (Week 1): Patient will attend to left of environment with mod assist semantic cues during functional activities  SLP Short Term Goal 3 (Week 1): Patient will consume Dys.1 textures and thin liquids with max assist verbal and tactile cues with no overt s/s of aspiration SLP Short Term Goal 4 (Week 1): Patient will utilize external aids for orientation with max assist semantic cues SLP Short Term Goal 5 (Week 1): Patient will increase speech intelligibility during sentence level expression with mod assist sematnic cues to utilize compensatory strategies  Skilled Therapeutic Interventions: Co-treatment with OT; SLP facilitated session with supervision verbal cues to utilize small bites and sips as well as to empty mouth prior to taking another bite. SLP also facilitated session with mod assist verbal and demonstrative cues to perform throat clears to remove suspected penetrates. Patient utilizing second swallow with increased wait time during session.    FIM:  Comprehension Comprehension Mode: Auditory Comprehension: 2-Understands basic 25 - 49% of the time/requires cueing 51 - 75% of the time Expression Expression Mode: Verbal Expression: 3-Expresses basic 50 - 74% of the time/requires cueing 25 - 50% of the time. Needs to repeat parts of sentences. Social Interaction Social Interaction: 5-Interacts appropriately 90% of the time - Needs monitoring or encouragement for participation or interaction. Problem Solving Problem Solving: 2-Solves basic 25 - 49% of the time - needs direction more than half the time to initiate, plan or  complete simple activities Memory Memory: 1-Recognizes or recalls less than 25% of the time/requires cueing greater than 75% of the time FIM - Eating Eating Activity: 4: Help with managing cup/glass  Pain Pain Assessment Pain Assessment: No/denies pain  Therapy/Group: Group Therapy  Charlane Ferretti., CCC-SLP [phone]  Cindy Abbott 08/27/2011, 1:06 PM

## 2011-08-27 NOTE — Progress Notes (Signed)
Physical Therapy Session Note  Patient Details  Name: Cindy Abbott MRN: [id number] Date of Birth: [birth year]  Today's Date: 08/27/2011 Time: 1530-1600 Time Calculation (min): 30 min  Short Term Goals: Week 1:  PT Short Term Goal 1 (Week 1): Patient will perform bed mobility flat bed to L and R with mod A PT Short Term Goal 2 (Week 1): Patient will perform bed <> w/c transfers to L and R with mod A PT Short Term Goal 3 (Week 1): Patient will tolerate dynamic and static sitting balance and trunk control training without back support and mod A x 5-10 minutes PT Short Term Goal 4 (Week 1): Patient will perform standing balance and pre gait training with max A x 5 minutes PT Short Term Goal 5 (Week 1): Patient will perform gait x 25' with max A and LRAD in controlled environment  Skilled Therapeutic Interventions/Progress Updates:   Patient with increased difficulty sequencing squat pivots; performed sit <> stand and stand pivots from w/c > mat and w/c > recliner more automatically but still requires max A for lifting and lowering and for weight shifting to allow LLE to advance when pivoting.  Following gait training and not being able to tolerate standing activities due to fatigue and low Sp02 and SOB patient performed seated trunk and postural control activities in w/c with focus on patient initiating and actively performing anterior pelvic tilt and thoracic extension during reaching with LUE upwards, forward, to L and across midline for rings to facilitate trunk elongation during anterior leans in preparation for sit>stand.  Gait: attempted pre gait training standing with bilat UE support on stairs with attempt to focus on L stance during RLE toe taps to step but patient advanced self up stairs and ended up requiring +2 A to fully ascend and descend stairs safely secondary to fatigue, SOB and LOB/collapsing to L side.  When attempting static standing, postural control with UE support on chair patient  unable to maintain standing secondary to L sided fatigue.  Therapy Documentation Precautions:  Precautions Precautions: Fall Precaution Comments: Dys 1, Nectar thick liquids, TEDs donned OOB Restrictions Weight Bearing Restrictions: No Vital Signs: Therapy Vitals Pulse Rate: 60  Oxygen Therapy SpO2: 83 % (with activity; increased to 95% with sitting rest break) O2 Device: None (Room air) Pain: Pain Assessment Pain Assessment: No/denies pain  See FIM for current functional status  Therapy/Group: Individual Therapy  Edman Circle [facility] 08/27/2011, 4:09 PM

## 2011-08-27 NOTE — Progress Notes (Signed)
Occupational Therapy Note  Patient Details  Name: Cindy Abbott MRN: [id number] Date of Birth: [birth year] Today's Date: 08/27/2011 Time: 1130-1200 (30 mins)  Pt seen for group session, [employer], with focus on self-feeding, attention to Lt body and environment, and attention to task. Pt required min cues for setup assist and min-mod cues for attention to Lt side of plate. Utilized turning of plate to promote attention to each food item. Pt with appropriate self-feeding with Rt hand with occasional cues for completion of task. Pt required increased cues this session for initiation of self-feeding and continued focus on completion of tasks.  Pt required cues to put down utensil to pick up cup.  Pt with no c/o pain this session.      [name] 08/27/2011, 12:28 PM

## 2011-08-27 NOTE — Progress Notes (Signed)
Patient ID: Cindy Abbott, female   DOB: [birth year], 78 y.o.   MRN: [id number]  Subjective/Complaints: I need to go to the bathroom  Review of Systems  Genitourinary:       Retention  All other systems reviewed and are negative.     Objective: Vital Signs: Blood pressure 138/63, pulse 61, temperature 98.3 F (36.8 C), temperature source Oral, resp. rate 17, height 5\' 5"  (1.651 m), weight 69.2 kg (152 lb 8.9 oz), SpO2 94.00%. Dg Chest 2 View  08/25/2011  *RADIOLOGY REPORT*  Clinical Data: Hypoxia.  Asthma and CHF  CHEST - 2 VIEW  Comparison: 08/22/2011  Findings: There is a left chest wall pacer device with lead in the right ventricle. The heart size is moderately enlarged.   Interval improvement in small pleural effusions.  Chronic interstitial coarsening appears similar to previous exam.  Review of the visualized osseous structures is unremarkable.  IMPRESSION: Interval decrease in the small pleural effusions consistent with improving CHF.   Original Report Authenticated By: Rosealee Albee, M.D.    Results for orders placed during the hospital encounter of 08/23/11 (from the past 72 hour(s))  GLUCOSE, CAPILLARY     Status: Normal   Collection Time   08/24/11 12:00 PM      Component Value Range Comment   Glucose-Capillary 70  70 - 99 mg/dL    Comment 1 Notify RN     GLUCOSE, CAPILLARY     Status: Normal   Collection Time   08/24/11 12:34 PM      Component Value Range Comment   Glucose-Capillary 76  70 - 99 mg/dL    Comment 1 Notify RN     GLUCOSE, CAPILLARY     Status: Normal   Collection Time   08/24/11  4:30 PM      Component Value Range Comment   Glucose-Capillary 76  70 - 99 mg/dL    Comment 1 Notify RN     GLUCOSE, CAPILLARY     Status: Abnormal   Collection Time   08/24/11  9:54 PM      Component Value Range Comment   Glucose-Capillary 208 (*) 70 - 99 mg/dL    Comment 1 Notify RN     GLUCOSE, CAPILLARY     Status: Abnormal   Collection Time   08/25/11  7:49 AM   Component Value Range Comment   Glucose-Capillary 143 (*) 70 - 99 mg/dL    Comment 1 Notify RN     GLUCOSE, CAPILLARY     Status: Abnormal   Collection Time   08/25/11 11:30 AM      Component Value Range Comment   Glucose-Capillary 148 (*) 70 - 99 mg/dL    Comment 1 Notify RN     GLUCOSE, CAPILLARY     Status: Abnormal   Collection Time   08/25/11  5:02 PM      Component Value Range Comment   Glucose-Capillary 100 (*) 70 - 99 mg/dL   GLUCOSE, CAPILLARY     Status: Abnormal   Collection Time   08/25/11  9:05 PM      Component Value Range Comment   Glucose-Capillary 168 (*) 70 - 99 mg/dL    Comment 1 Notify RN     GLUCOSE, CAPILLARY     Status: Abnormal   Collection Time   08/26/11  7:31 AM      Component Value Range Comment   Glucose-Capillary 114 (*) 70 - 99 mg/dL   GLUCOSE, CAPILLARY  Status: Abnormal   Collection Time   08/26/11 11:35 AM      Component Value Range Comment   Glucose-Capillary 114 (*) 70 - 99 mg/dL   GLUCOSE, CAPILLARY     Status: Abnormal   Collection Time   08/26/11  4:22 PM      Component Value Range Comment   Glucose-Capillary 50 (*) 70 - 99 mg/dL   GLUCOSE, CAPILLARY     Status: Abnormal   Collection Time   08/26/11  4:37 PM      Component Value Range Comment   Glucose-Capillary 60 (*) 70 - 99 mg/dL   GLUCOSE, CAPILLARY     Status: Normal   Collection Time   08/26/11  4:57 PM      Component Value Range Comment   Glucose-Capillary 84  70 - 99 mg/dL   GLUCOSE, CAPILLARY     Status: Abnormal   Collection Time   08/26/11  8:07 PM      Component Value Range Comment   Glucose-Capillary 172 (*) 70 - 99 mg/dL   GLUCOSE, CAPILLARY     Status: Abnormal   Collection Time   08/26/11  9:04 PM      Component Value Range Comment   Glucose-Capillary 170 (*) 70 - 99 mg/dL   GLUCOSE, CAPILLARY     Status: Abnormal   Collection Time   08/27/11  7:19 AM      Component Value Range Comment   Glucose-Capillary 153 (*) 70 - 99 mg/dL    Comment 1 Notify RN         HEENT: normal Cardio: RRR Resp: CTA B/L GI: BS positive Extremity:  No Edema Skin:   Intact Neuro: Confused, Flat, Normal Sensory and Abnormal Motor 4/5 on Left side UE and LE Musc/Skel:  Normal Skin is quite dry  Assessment/Plan: 1. Functional deficits secondary to R ICA infarct with L HP, Mild L neglect which require 3+ hours per day of interdisciplinary therapy in a comprehensive inpatient rehab setting. Physiatrist is providing close team supervision and 24 hour management of active medical problems listed below. Physiatrist and rehab team continue to assess barriers to discharge/monitor patient progress toward functional and medical goals. FIM: FIM - Bathing Bathing Steps Patient Completed: Chest;Right Arm;Left Arm;Front perineal area;Right upper leg;Left upper leg;Right lower leg (including foot);Left lower leg (including foot) Bathing: 4: Min-Patient completes 8-9 65f 10 parts or 75+ percent  FIM - Upper Body Dressing/Undressing Upper body dressing/undressing steps patient completed: Thread/unthread right sleeve of front closure shirt/dress;Pull shirt around back of front closure shirt/dress Upper body dressing/undressing: 3: Mod-Patient completed 50-74% of tasks FIM - Lower Body Dressing/Undressing Lower body dressing/undressing steps patient completed: Thread/unthread right underwear leg;Thread/unthread left underwear leg;Pull underwear up/down;Thread/unthread right pants leg;Thread/unthread left pants leg Lower body dressing/undressing: 3: Mod-Patient completed 50-74% of tasks  FIM - Toileting Toileting: 1: Total-Patient completed zero steps, helper did all 3  FIM - Diplomatic Services operational officer Devices: Grab bars Toilet Transfers: 2-To toilet/BSC: Max A (lift and lower assist);2-From toilet/BSC: Max A (lift and lower assist)  FIM - Banker Devices: Bed rails Bed/Chair Transfer: 2: Supine > Sit: Max A (lifting  assist/Pt. 25-49%);2: Bed > Chair or W/C: Max A (lift and lower assist);2: Chair or W/C > Bed: Max A (lift and lower assist)  FIM - Locomotion: Wheelchair Distance: 150 Locomotion: Wheelchair: 1: Total Assistance/staff pushes wheelchair (Pt<25%) FIM - Locomotion: Ambulation Locomotion: Ambulation Assistive Devices: Fara Boros Ambulation/Gait Assistance: 2: Max assist  Locomotion: Ambulation: 0: Activity did not occur  Comprehension Comprehension Mode: Auditory Comprehension: 3-Understands basic 50 - 74% of the time/requires cueing 25 - 50%  of the time  Expression Expression Mode: Verbal Expression: 3-Expresses basic 50 - 74% of the time/requires cueing 25 - 50% of the time. Needs to repeat parts of sentences.  Social Interaction Social Interaction: 3-Interacts appropriately 50 - 74% of the time - May be physically or verbally inappropriate.  Problem Solving Problem Solving: 2-Solves basic 25 - 49% of the time - needs direction more than half the time to initiate, plan or complete simple activities  Memory Memory: 1-Recognizes or recalls less than 25% of the time/requires cueing greater than 75% of the time  Medical Problem List and Plan:  1. 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 DVT Prophylaxis/Anticoagulation: SCDs. Monitor for signs of DVT. Will consider Lovenox therapy  3. Dysphagia. Dysphagia 1 diet with thin liquids as per modified barium swallow 08/23/2011. Monitor for any signs of aspiration. Followup speech therapy. . Monitor hydration  . Latest creatinine 1.18 4. Neuropsych: This patient is not capable of making decisions on her own behalf.  5. Diabetes mellitus. Hemoglobin A1c of 8.2. Lantus insulin 10 units each bedtime as well as NovoLog 10 units before lunch and supper and 5 units before breakfast . Hold parameters added to 10 unit dose to help avoid hypoglycemia.  6. Hypertension. Clonidine patch 0.2 mg weekly,.imdur 30 mg daily,  Lopressor 25 mg twice a day. Monitor the increased mobility . Patient denies any dizziness when up for therapies. No chest pain or shortness of breath 7. Escherichia coli UTI. Continue Bactrim  Which was initiated 08/23/2011. No dysuria noted 8. CAD status post pacemaker. Patient denies any chest pain. Continue aspirin therapy 9.  Yeast vaginitis mycostatin  LOS (Days) 4 A FACE TO FACE EVALUATION WAS PERFORMED  Lon Klippel T 08/27/2011, 7:47 AM

## 2011-08-27 NOTE — Progress Notes (Signed)
Physical Therapy Note  Patient Details  Name: Britini Garcilazo MRN: [id number] Date of Birth: 1922/03/29 Today's Date: 08/27/2011  8:30-9:30 individual therapy pt denied pain  Performed sit to stand at kitchen sink for a more functional environment min assist x 2 pulling up on sink. Pt was able to reach across midline with rt hand to remove 4 dishes and place them on counter before needing to sit. Pt requested to use the bathroom. wc to toilet transfer was max assist x 2. Pt assisted with all steps of toileting but needed assist with all. Sit to stand to wash hands max assist and static standing at sink became max assist. wc to bed transfer max assist sit to supine max assist. O2 was 78% and increased to 99% on 3 LO2. O2 was removed after session.    Julian Reil 08/27/2011, 9:34 AM

## 2011-08-27 NOTE — Progress Notes (Signed)
Inpatient Diabetes Program Recommendations  AACE/ADA: New Consensus Statement on Inpatient Glycemic Control  Target Ranges:  Prepandial:   less than 140 mg/dL      Peak postprandial:   less than 180 mg/dL (1-2 hours)      Critically ill patients:  140 - 180 mg/dL  Pager:  [phone] Hours:  8 am-10pm   Reason for Visit: Hypoglycemia yesterday at supper:  50 mg/dL  Inpatient Diabetes Program Recommendations  Insulin - Meal Coverage: Decrease lunch meal coverage to 5 units   Alfredia Client PhD, RN, BC-ADM Diabetes Coordinator  Office:  [phone] Team Pager:  [phone]

## 2011-08-28 ENCOUNTER — Inpatient Hospital Stay (HOSPITAL_COMMUNITY): Payer: Medicare Other | Admitting: Occupational Therapy

## 2011-08-28 ENCOUNTER — Inpatient Hospital Stay (HOSPITAL_COMMUNITY): Payer: Medicare Other | Admitting: Physical Therapy

## 2011-08-28 ENCOUNTER — Inpatient Hospital Stay (HOSPITAL_COMMUNITY): Payer: Medicare Other | Admitting: Speech Pathology

## 2011-08-28 LAB — GLUCOSE, CAPILLARY

## 2011-08-28 NOTE — Progress Notes (Signed)
Patient ID: Cindy Abbott, female   DOB: 04/14/1922, 78 y.o.   MRN: [id number]  Subjective/Complaints: No new problems reported.   Review of Systems  Genitourinary:       Retention  All other systems reviewed and are negative.     Objective: Vital Signs: Blood pressure 123/73, pulse 63, temperature 98.4 F (36.9 C), temperature source Oral, resp. rate 18, height 5\' 5"  (1.651 m), weight 69.2 kg (152 lb 8.9 oz), SpO2 97.00%. No results found. Results for orders placed during the hospital encounter of 08/23/11 (from the past 72 hour(s))  GLUCOSE, CAPILLARY     Status: Abnormal   Collection Time   08/25/11 11:30 AM      Component Value Range Comment   Glucose-Capillary 148 (*) 70 - 99 mg/dL    Comment 1 Notify RN     GLUCOSE, CAPILLARY     Status: Abnormal   Collection Time   08/25/11  5:02 PM      Component Value Range Comment   Glucose-Capillary 100 (*) 70 - 99 mg/dL   GLUCOSE, CAPILLARY     Status: Abnormal   Collection Time   08/25/11  9:05 PM      Component Value Range Comment   Glucose-Capillary 168 (*) 70 - 99 mg/dL    Comment 1 Notify RN     GLUCOSE, CAPILLARY     Status: Abnormal   Collection Time   08/26/11  7:31 AM      Component Value Range Comment   Glucose-Capillary 114 (*) 70 - 99 mg/dL   GLUCOSE, CAPILLARY     Status: Abnormal   Collection Time   08/26/11 11:35 AM      Component Value Range Comment   Glucose-Capillary 114 (*) 70 - 99 mg/dL   GLUCOSE, CAPILLARY     Status: Abnormal   Collection Time   08/26/11  4:22 PM      Component Value Range Comment   Glucose-Capillary 50 (*) 70 - 99 mg/dL   GLUCOSE, CAPILLARY     Status: Abnormal   Collection Time   08/26/11  4:37 PM      Component Value Range Comment   Glucose-Capillary 60 (*) 70 - 99 mg/dL   GLUCOSE, CAPILLARY     Status: Normal   Collection Time   08/26/11  4:57 PM      Component Value Range Comment   Glucose-Capillary 84  70 - 99 mg/dL   GLUCOSE, CAPILLARY     Status: Abnormal   Collection Time   08/26/11  8:07 PM      Component Value Range Comment   Glucose-Capillary 172 (*) 70 - 99 mg/dL   GLUCOSE, CAPILLARY     Status: Abnormal   Collection Time   08/26/11  9:04 PM      Component Value Range Comment   Glucose-Capillary 170 (*) 70 - 99 mg/dL   GLUCOSE, CAPILLARY     Status: Abnormal   Collection Time   08/27/11  7:19 AM      Component Value Range Comment   Glucose-Capillary 153 (*) 70 - 99 mg/dL    Comment 1 Notify RN     BASIC METABOLIC PANEL     Status: Abnormal   Collection Time   08/27/11  9:45 AM      Component Value Range Comment   Sodium 135  135 - 145 mEq/L    Potassium 4.8  3.5 - 5.1 mEq/L    Chloride 102  96 - 112 mEq/L  CO2 23  19 - 32 mEq/L    Glucose, Bld 140 (*) 70 - 99 mg/dL    BUN 12  6 - 23 mg/dL    Creatinine, Ser 1.19 (*) 0.50 - 1.10 mg/dL    Calcium 9.0  8.4 - 14.7 mg/dL    GFR calc non Af Amer 37 (*) >90 mL/min    GFR calc Af Amer 43 (*) >90 mL/min   GLUCOSE, CAPILLARY     Status: Abnormal   Collection Time   08/27/11 11:32 AM      Component Value Range Comment   Glucose-Capillary 118 (*) 70 - 99 mg/dL    Comment 1 Notify RN     GLUCOSE, CAPILLARY     Status: Normal   Collection Time   08/27/11  4:30 PM      Component Value Range Comment   Glucose-Capillary 93  70 - 99 mg/dL    Comment 1 Notify RN     GLUCOSE, CAPILLARY     Status: Abnormal   Collection Time   08/27/11  8:46 PM      Component Value Range Comment   Glucose-Capillary 141 (*) 70 - 99 mg/dL   GLUCOSE, CAPILLARY     Status: Abnormal   Collection Time   08/28/11  7:33 AM      Component Value Range Comment   Glucose-Capillary 112 (*) 70 - 99 mg/dL      HEENT: normal Cardio: RRR Resp: CTA B/L GI: BS positive Extremity:  No Edema Skin:   Intact Neuro: Confused, Flat, Normal Sensory and Abnormal Motor 4/5 on Left side UE and LE Musc/Skel:  Normal Skin is quite dry  Assessment/Plan: 1. Functional deficits secondary to R ICA infarct with L HP, Mild L neglect which require 3+  hours per day of interdisciplinary therapy in a comprehensive inpatient rehab setting. Physiatrist is providing close team supervision and 24 hour management of active medical problems listed below. Physiatrist and rehab team continue to assess barriers to discharge/monitor patient progress toward functional and medical goals. FIM: FIM - Bathing Bathing Steps Patient Completed: Chest;Right Arm;Left Arm;Front perineal area;Right upper leg;Left upper leg;Right lower leg (including foot);Left lower leg (including foot) Bathing: 4: Min-Patient completes 8-9 26f 10 parts or 75+ percent  FIM - Upper Body Dressing/Undressing Upper body dressing/undressing steps patient completed: Thread/unthread right sleeve of front closure shirt/dress;Thread/unthread left sleeve of front closure shirt/dress Upper body dressing/undressing: 3: Mod-Patient completed 50-74% of tasks FIM - Lower Body Dressing/Undressing Lower body dressing/undressing steps patient completed: Thread/unthread right underwear leg;Thread/unthread left underwear leg;Pull underwear up/down;Thread/unthread right pants leg;Thread/unthread left pants leg;Pull pants up/down Lower body dressing/undressing: 3: Mod-Patient completed 50-74% of tasks  FIM - Toileting Toileting: 1: Total-Patient completed zero steps, helper did all 3  FIM - Diplomatic Services operational officer Devices: Grab bars Toilet Transfers: 2-To toilet/BSC: Max A (lift and lower assist);2-From toilet/BSC: Max A (lift and lower assist)  FIM - Banker Devices: Bed rails Bed/Chair Transfer: 2: Bed > Chair or W/C: Max A (lift and lower assist);2: Chair or W/C > Bed: Max A (lift and lower assist)  FIM - Locomotion: Wheelchair Distance: 150 Locomotion: Wheelchair: 1: Total Assistance/staff pushes wheelchair (Pt<25%) FIM - Locomotion: Ambulation Locomotion: Ambulation Assistive Devices: Fara Boros Ambulation/Gait Assistance: 2: Max  assist Locomotion: Ambulation: 0: Activity did not occur  Comprehension Comprehension Mode: Auditory Comprehension: 2-Understands basic 25 - 49% of the time/requires cueing 51 - 75% of the time  Expression Expression Mode:  Verbal Expression: 3-Expresses basic 50 - 74% of the time/requires cueing 25 - 50% of the time. Needs to repeat parts of sentences.  Social Interaction Social Interaction: 5-Interacts appropriately 90% of the time - Needs monitoring or encouragement for participation or interaction.  Problem Solving Problem Solving: 2-Solves basic 25 - 49% of the time - needs direction more than half the time to initiate, plan or complete simple activities  Memory Memory: 1-Recognizes or recalls less than 25% of the time/requires cueing greater than 75% of the time  Medical Problem List and Plan:  1. 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 DVT Prophylaxis/Anticoagulation: SCDs. Monitor for signs of DVT. Will consider Lovenox therapy  3. Dysphagia. Dysphagia 1 diet with thin liquids as per modified barium swallow 08/23/2011. Monitor for any signs of aspiration. Followup speech therapy. . Monitor hydration  . Latest creatinine 1.18 4. Neuropsych: This patient is not capable of making decisions on her own behalf.  5. Diabetes mellitus. Hemoglobin A1c of 8.2. Lantus insulin 10 units each bedtime as well as NovoLog 10 units before lunch and supper and 5 units before breakfast . Hold parameters added to 10 unit dose to help avoid hypoglycemia. Sugars well controlled at present. 6. Hypertension. Clonidine patch 0.2 mg weekly,.imdur 30 mg daily, Lopressor 25 mg twice a day. Monitor the increased mobility . Patient denies any dizziness when up for therapies. No chest pain or shortness of breath 7. Escherichia coli UTI. Continue Bactrim  Which was initiated 08/23/2011. No dysuria noted 8. CAD status post pacemaker. Patient denies any chest pain. Continue  aspirin therapy 9.  Yeast vaginitis mycostatin  LOS (Days) 5 A FACE TO FACE EVALUATION WAS PERFORMED  SWARTZ,ZACHARY T 08/28/2011, 9:36 AM

## 2011-08-28 NOTE — Progress Notes (Signed)
Physical Therapy Session Note  Patient Details  Name: Cindy Abbott MRN: [id number] Date of Birth: April 04, 1922  Today's Date: 08/28/2011 Time:  - 1510-1605 (55 minutes) individual Pain: no complaint of pain    Short Term Goals: Week 1:  PT Short Term Goal 1 (Week 1): Patient will perform bed mobility flat bed to L and R with mod A PT Short Term Goal 2 (Week 1): Patient will perform bed <> w/c transfers to L and R with mod A PT Short Term Goal 3 (Week 1): Patient will tolerate dynamic and static sitting balance and trunk control training without back support and mod A x 5-10 minutes PT Short Term Goal 4 (Week 1): Patient will perform standing balance and pre gait training with max A x 5 minutes PT Short Term Goal 5 (Week 1): Patient will perform gait x 25' with max A and LRAD in controlled environment  Skilled Therapeutic Interventions/Progress Updates: Focus of treatment: Standing balance/allignment without AD; gait training with RW     Therapy Documentation Precautions:  Precautions Precautions: Fall Precaution Comments: Dys 1, Nectar thick liquids, TEDs donned OOB Restrictions Weight Bearing Restrictions: No General: Pt in wc   Vital Signs: Therapy Vitals Temp: 98.4 F (36.9 C) Temp src: Oral Pulse Rate: 63  Resp: 18  BP: 123/73 mmHg Patient Position, if appropriate: Lying Oxygen Therapy SpO2: 100 % O2 Device: None (Room air) Pulse Oximetry Type: Intermittent    Mobility: Sit to stand from wc min assist with max cues for hand placement   Locomotion : Gait 10 feet X 3 with RW min/mod assist with vcs or tactile cues to facilitate increased step length on left . Intermittent decreased Lt hip/knee extension control in stance.Oxygen sats post ambulation > 92% RA pulse 59-62 BPM    Trunk/Postural Assessment : Standing without UE assist- pt with increased forward trunk lean /unable to correct with vcs or tactile cueing.      Exercises: Standing with RW - marching in place 3 X  10 to increase left hip AROM/strength to improve step length    See FIM for current functional status  Therapy/Group: Individual Therapy  Pete Schnitzer,JIM 08/28/2011, 8:29 AM

## 2011-08-28 NOTE — Progress Notes (Addendum)
Occupational Therapy Session Note  Patient Details  Name: Cindy Abbott MRN: [id number] Date of Birth: [birth year]  Today's Date: 08/28/2011 Time: 4098-1191 Time Calculation (min): 47 min  Session 2:   Time: 13:45-14:17 Time Calculation (min):  32 mins  Short Term Goals: Week 1:  OT Short Term Goal 1 (Week 1): Pt will complete UB bathing with min assist OT Short Term Goal 2 (Week 1): Pt will complete LB bathing with mod assist OT Short Term Goal 3 (Week 1): Pt will complete UB dressing with min assist OT Short Term Goal 4 (Week 1): Pt will complete LB dressing with mod assist OT Short Term Goal 5 (Week 1): Pt will complete toilet transfer with mod assist  Skilled Therapeutic Interventions/Progress Updates:    Session 1:  Bathing and dressing at the sink.  Pt needing max instructional cueing to sequence through bathing task.  Integrated use of the LUE for washing the right side and sit to stand with max instructional cueing as well.  Mod facilitation for sit to stand during LB selfcare.  Pt with decreased ability to reach her feet for donning socks but could donn pants over them.  Also performed toilet transfer during session stand pivot using the RW.  Pt needing max instructional cueing for technique and mod facilitation.  Demonstrates forward trunk flexion in standing and decreased sustained left knee extension.  Session 2:  Worked on LUE coordination in sitting and standing.  Pt only able to maintain standing for 2-3 min intervals X 2 during session.  Increased forward trunk flexion and decreased sustained left knee extension in standing.  Pt attempted to pick up checkers with the LUE and insert into grid.  Noted moderate difficulty attempting this task in standing.  Did better in sitting but needed constant min facilitation to maintain sitting in neutral pelvic tilt and avoid slumping back into the chair.  Therapy Documentation Precautions:  Precautions Precautions: Fall Precaution Comments:  Dys 1, Nectar thick liquids, TEDs donned OOB Restrictions Weight Bearing Restrictions: No  Pain: Pain Assessment Pain Assessment: No/denies pain ADL: See FIM for current functional status  Therapy/Group: Individual Therapy  Arian Murley OTR/L 08/28/2011, 3:32 PM

## 2011-08-28 NOTE — Progress Notes (Signed)
Physical Therapy Session Note  Patient Details  Name: Cindy Abbott MRN: [id number] Date of Birth: Jan 14, 1922  Today's Date: 08/28/2011 Time: 0930-1000 Time Calculation (min): 30 min  Short Term Goals: Week 1:  PT Short Term Goal 1 (Week 1): Patient will perform bed mobility flat bed to L and R with mod A PT Short Term Goal 2 (Week 1): Patient will perform bed <> w/c transfers to L and R with mod A PT Short Term Goal 3 (Week 1): Patient will tolerate dynamic and static sitting balance and trunk control training without back support and mod A x 5-10 minutes PT Short Term Goal 4 (Week 1): Patient will perform standing balance and pre gait training with max A x 5 minutes PT Short Term Goal 5 (Week 1): Patient will perform gait x 25' with max A and LRAD in controlled environment  Therapy Documentation Precautions:  Precautions Precautions: Fall Precaution Comments: Dys 1, Nectar thick liquids, TEDs donned OOB Restrictions Weight Bearing Restrictions: No Pain: Pain Assessment Pain Score: 0-No pain  Therapeutic Activity:(15') Monitoring O2 sats throughout treatment session which maintained in low to mid 90's.                                         Transfer training sit<->stand multiple times with min-Assist,  w/c<->recliner using RW with min-Assist Therapeutic Exercise:(15') B LE's in sitting    Therapy/Group: Individual Therapy  Rex Kras 08/28/2011, 9:41 AM

## 2011-08-28 NOTE — Progress Notes (Signed)
Speech Language Pathology Daily Session Note  Patient Details  Name: Cindy Abbott MRN: [id number] Date of Birth: 1922-03-03  Today's Date: 08/28/2011 Time: 1130-1200 Time Calculation (min): 30 min  Short Term Goals: Week 1: SLP Short Term Goal 1 (Week 1): Patient will sustain attention to basic familiar task for 1 minute with mod assist semantic cues SLP Short Term Goal 1 - Progress (Week 1): Progressing toward goal SLP Short Term Goal 2 (Week 1): Patient will attend to left of environment with mod assist semantic cues during functional activities  SLP Short Term Goal 2 - Progress (Week 1): Progressing toward goal SLP Short Term Goal 3 (Week 1): Patient will consume Dys.1 textures and thin liquids with max assist verbal and tactile cues with no overt s/s of aspiration SLP Short Term Goal 3 - Progress (Week 1): Progressing toward goal SLP Short Term Goal 4 (Week 1): Patient will utilize external aids for orientation with max assist semantic cues SLP Short Term Goal 4 - Progress (Week 1): Progressing toward goal SLP Short Term Goal 5 (Week 1): Patient will increase speech intelligibility during sentence level expression with mod assist sematnic cues to utilize compensatory strategies SLP Short Term Goal 5 - Progress (Week 1): Progressing toward goal  Skilled Therapeutic Interventions: Co-treatment with OT; SLP completed skilled observation of po tolerance. Patient able to consume recommended diet with verbal and occassional demonstration cues for use of swallowing strategies including multiple swallows, slow rate, clearance of oral cavity, and intermittent throat clear. Subtle and intermittent wet vocal quality indicative of potential penetration and/or aspiraiton observed.    FIM:  Comprehension Comprehension Mode: Auditory Comprehension: 3-Understands basic 50 - 74% of the time/requires cueing 25 - 50%  of the time Expression Expression Mode: Verbal Expression: 4-Expresses basic 75 - 89% of  the time/requires cueing 10 - 24% of the time. Needs helper to occlude trach/needs to repeat words. Social Interaction Social Interaction: 5-Interacts appropriately 90% of the time - Needs monitoring or encouragement for participation or interaction. Problem Solving Problem Solving: 2-Solves basic 25 - 49% of the time - needs direction more than half the time to initiate, plan or complete simple activities Memory Memory: 1-Recognizes or recalls less than 25% of the time/requires cueing greater than 75% of the time  Pain Pain Assessment Pain Assessment: No/denies pain Pain Score: 0-No pain  Therapy/Group: Individual Therapy  Isael Stille Meryl 08/28/2011, 12:26 PM

## 2011-08-28 NOTE — Progress Notes (Signed)
Occupational Therapy Session Note  Patient Details  Name: Cindy Abbott MRN: [id number] Date of Birth: 08-Oct-1922  Today's Date: 08/28/2011 Time: 12:00-12:30 Time Calculation (min): 30 min   Skilled Therapeutic Interventions/Progress Updates:    Worked on LUE use and scanning to the left in group feeding session.  Pt able to integrate the LUE as a stabilizer to hold her apple sauce while using the utensil with the dominant right hand.  Utilized the LUE as well for holding cup along with the RUE for 10 % of drinking.  Therapy Documentation Precautions:  Precautions Precautions: Fall Precaution Comments: Dys 1, Nectar thick liquids, TEDs donned OOB Restrictions Weight Bearing Restrictions: No  Pain: Pain Assessment Pain Assessment: No/denies pain ADL: See FIM for current functional status  Therapy/Group: Group Therapy  Cindy Abbott OTR/L 08/28/2011, 3:45 PM

## 2011-08-29 ENCOUNTER — Inpatient Hospital Stay (HOSPITAL_COMMUNITY): Payer: Medicare Other | Admitting: *Deleted

## 2011-08-29 LAB — GLUCOSE, CAPILLARY
Glucose-Capillary: 77 mg/dL (ref 70–99)
Glucose-Capillary: 136 mg/dL — ABNORMAL HIGH (ref 70–99)

## 2011-08-29 NOTE — Progress Notes (Signed)
Physical Therapy Session Note  Patient Details  Name: Cindy Abbott MRN: [id number] Date of Birth: 22-Mar-1922  Today's Date: 08/29/2011 Time: 4098-1191 Time Calculation (min): 29 min  Short Term Goals: Week 1:  PT Short Term Goal 1 (Week 1): Patient will perform bed mobility flat bed to L and R with mod A PT Short Term Goal 2 (Week 1): Patient will perform bed <> w/c transfers to L and R with mod A PT Short Term Goal 3 (Week 1): Patient will tolerate dynamic and static sitting balance and trunk control training without back support and mod A x 5-10 minutes PT Short Term Goal 4 (Week 1): Patient will perform standing balance and pre gait training with max A x 5 minutes PT Short Term Goal 5 (Week 1): Patient will perform gait x 25' with max A and LRAD in controlled environment  Skilled Therapeutic Interventions/Progress Updates:  Tx focused on functional tasks, transfer training, and standing tolerance to improve activity tolerance and LE strength. 02 sats monitored throughout.  Supine>sit with S only and increased time required. With cues to scoot to edge of bed with feet on the floor, pt began laying back in bed. Min A for scooting to edge.  Stand-pivot transfers x4 with Mod/Max A and cues for technique and hand placement. Pt began to sit before safe to do so, needing assistance to correct.  Sit<>stand at sink for functional washing tasks with Mod A to stand and reach nearly full upright position. Stood 4x30 sec with encouragement to continue. Pt needing rest breaks throughout.      Therapy Documentation Precautions:  Precautions Precautions: Fall Precaution Comments: Dys 1, Nectar thick liquids, TEDs donned OOB Restrictions Weight Bearing Restrictions: No Pain: Pain Assessment Pain Score: 0-No pain     See FIM for current functional status  Therapy/Group: Individual Therapy  Virl Cagey, PT 08/29/2011, 10:50 AM

## 2011-08-29 NOTE — Progress Notes (Signed)
Occupational Therapy Session Note  Patient Details  Name: Cindy Abbott MRN: [id number] Date of Birth: Jun 17, 1922  Today's Date: 08/29/2011 Time: 4098-1191 Time Calculation (min): 40 min  Short Term Goals: Week 1:  OT Short Term Goal 1 (Week 1): Pt will complete UB bathing with min assist OT Short Term Goal 2 (Week 1): Pt will complete LB bathing with mod assist OT Short Term Goal 3 (Week 1): Pt will complete UB dressing with min assist OT Short Term Goal 4 (Week 1): Pt will complete LB dressing with mod assist OT Short Term Goal 5 (Week 1): Pt will complete toilet transfer with mod assist  Skilled Therapeutic Interventions/Progress Updates:    Engaged in toilet transfers, bed mobility, sit to stand, standing tolerance.  Pt. Lying in bed upon OT arrival.  Indicated she needed to go to bathroom.  Used RW and walked about 5 feet to [facility] [address] with max assist.  Pt demonstrated poor postural control, increased leaning to left, and decreased left upper extremity control.  Pt. Not oriented to situation.  She transferred from toilet to wc with moderate assist.  Stood at sink to don brief (mod assist).  Left in wc with call bell and phone in reach.  Red safety belt around pt.    Therapy Documentation Precautions:  Precautions Precautions: Fall Precaution Comments: Dys 1, Nectar thick liquids, TEDs donned OOB Restrictions Weight Bearing Restrictions: No    Pain:  none   ADL: See FIM for current functional status  Therapy/Group: Individual Therapy  Humberto Seals 08/29/2011, 6:20 PM

## 2011-08-29 NOTE — Progress Notes (Signed)
Patient ID: Cindy Abbott, female   DOB: Aug 20, 1922, 78 y.o.   MRN: [id number]  Subjective/Complaints: No new problems reported. Sugar a little low this am.  Review of Systems  Genitourinary:       Retention  All other systems reviewed and are negative.     Objective: Vital Signs: Blood pressure 158/68, pulse 60, temperature 97.9 F (36.6 C), temperature source Oral, resp. rate 18, height 5\' 5"  (1.651 m), weight 65.6 kg (144 lb 10 oz), SpO2 97.00%. No results found. Results for orders placed during the hospital encounter of 08/23/11 (from the past 72 hour(s))  GLUCOSE, CAPILLARY     Status: Abnormal   Collection Time   08/26/11 11:35 AM      Component Value Range Comment   Glucose-Capillary 114 (*) 70 - 99 mg/dL   GLUCOSE, CAPILLARY     Status: Abnormal   Collection Time   08/26/11  4:22 PM      Component Value Range Comment   Glucose-Capillary 50 (*) 70 - 99 mg/dL   GLUCOSE, CAPILLARY     Status: Abnormal   Collection Time   08/26/11  4:37 PM      Component Value Range Comment   Glucose-Capillary 60 (*) 70 - 99 mg/dL   GLUCOSE, CAPILLARY     Status: Normal   Collection Time   08/26/11  4:57 PM      Component Value Range Comment   Glucose-Capillary 84  70 - 99 mg/dL   GLUCOSE, CAPILLARY     Status: Abnormal   Collection Time   08/26/11  8:07 PM      Component Value Range Comment   Glucose-Capillary 172 (*) 70 - 99 mg/dL   GLUCOSE, CAPILLARY     Status: Abnormal   Collection Time   08/26/11  9:04 PM      Component Value Range Comment   Glucose-Capillary 170 (*) 70 - 99 mg/dL   GLUCOSE, CAPILLARY     Status: Abnormal   Collection Time   08/27/11  7:19 AM      Component Value Range Comment   Glucose-Capillary 153 (*) 70 - 99 mg/dL    Comment 1 Notify RN     BASIC METABOLIC PANEL     Status: Abnormal   Collection Time   08/27/11  9:45 AM      Component Value Range Comment   Sodium 135  135 - 145 mEq/L    Potassium 4.8  3.5 - 5.1 mEq/L    Chloride 102  96 - 112 mEq/L    CO2  23  19 - 32 mEq/L    Glucose, Bld 140 (*) 70 - 99 mg/dL    BUN 12  6 - 23 mg/dL    Creatinine, Ser 4.09 (*) 0.50 - 1.10 mg/dL    Calcium 9.0  8.4 - 81.1 mg/dL    GFR calc non Af Amer 37 (*) >90 mL/min    GFR calc Af Amer 43 (*) >90 mL/min   GLUCOSE, CAPILLARY     Status: Abnormal   Collection Time   08/27/11 11:32 AM      Component Value Range Comment   Glucose-Capillary 118 (*) 70 - 99 mg/dL    Comment 1 Notify RN     GLUCOSE, CAPILLARY     Status: Normal   Collection Time   08/27/11  4:30 PM      Component Value Range Comment   Glucose-Capillary 93  70 - 99 mg/dL    Comment 1  Notify RN     GLUCOSE, CAPILLARY     Status: Abnormal   Collection Time   08/27/11  8:46 PM      Component Value Range Comment   Glucose-Capillary 141 (*) 70 - 99 mg/dL   GLUCOSE, CAPILLARY     Status: Abnormal   Collection Time   08/28/11  7:33 AM      Component Value Range Comment   Glucose-Capillary 112 (*) 70 - 99 mg/dL   GLUCOSE, CAPILLARY     Status: Abnormal   Collection Time   08/28/11 11:30 AM      Component Value Range Comment   Glucose-Capillary 144 (*) 70 - 99 mg/dL   GLUCOSE, CAPILLARY     Status: Abnormal   Collection Time   08/28/11  4:28 PM      Component Value Range Comment   Glucose-Capillary 128 (*) 70 - 99 mg/dL   GLUCOSE, CAPILLARY     Status: Abnormal   Collection Time   08/28/11  8:40 PM      Component Value Range Comment   Glucose-Capillary 138 (*) 70 - 99 mg/dL    Comment 1 Notify RN     GLUCOSE, CAPILLARY     Status: Normal   Collection Time   08/29/11  7:26 AM      Component Value Range Comment   Glucose-Capillary 77  70 - 99 mg/dL    Comment 1 Notify RN        HEENT: normal Cardio: RRR Resp: CTA B/L GI: BS positive Extremity:  No Edema Skin:   Intact Neuro: Confused, Flat, Normal Sensory and Abnormal Motor 4/5 on Left side UE and LE Musc/Skel:  Normal Skin is quite dry  Assessment/Plan: 1. Functional deficits secondary to R ICA infarct with L HP, Mild L  neglect which require 3+ hours per day of interdisciplinary therapy in a comprehensive inpatient rehab setting. Physiatrist is providing close team supervision and 24 hour management of active medical problems listed below. Physiatrist and rehab team continue to assess barriers to discharge/monitor patient progress toward functional and medical goals. FIM: FIM - Bathing Bathing Steps Patient Completed: Chest;Right Arm;Left Arm;Abdomen;Right upper leg;Left upper leg;Right lower leg (including foot);Left lower leg (including foot) Bathing: 4: Min-Patient completes 8-9 34f 10 parts or 75+ percent  FIM - Upper Body Dressing/Undressing Upper body dressing/undressing steps patient completed: Thread/unthread left sleeve of front closure shirt/dress Upper body dressing/undressing: 2: Max-Patient completed 25-49% of tasks FIM - Lower Body Dressing/Undressing Lower body dressing/undressing steps patient completed: Thread/unthread left pants leg;Thread/unthread right pants leg Lower body dressing/undressing: 2: Max-Patient completed 25-49% of tasks  FIM - Toileting Toileting: 1: Total-Patient completed zero steps, helper did all 3  FIM - Diplomatic Services operational officer Devices: Building control surveyor Transfers: 3-To toilet/BSC: Mod A (lift or lower assist);3-From toilet/BSC: Mod A (lift or lower assist)  FIM - Bed/Chair Transfer Bed/Chair Transfer Assistive Devices: Bed rails Bed/Chair Transfer: 2: Bed > Chair or W/C: Max A (lift and lower assist);2: Chair or W/C > Bed: Max A (lift and lower assist)  FIM - Locomotion: Wheelchair Distance: 150 Locomotion: Wheelchair: 1: Total Assistance/staff pushes wheelchair (Pt<25%) FIM - Locomotion: Ambulation Locomotion: Ambulation Assistive Devices: Designer, industrial/product Ambulation/Gait Assistance: 3: Mod assist Locomotion: Ambulation: 1: Travels less than 50 ft with moderate assistance (Pt: 50 - 74%)  Comprehension Comprehension Mode:  Auditory Comprehension: 3-Understands basic 50 - 74% of the time/requires cueing 25 - 50%  of the time  Expression Expression Mode: Verbal  Expression: 4-Expresses basic 75 - 89% of the time/requires cueing 10 - 24% of the time. Needs helper to occlude trach/needs to repeat words.  Social Interaction Social Interaction: 4-Interacts appropriately 75 - 89% of the time - Needs redirection for appropriate language or to initiate interaction.  Problem Solving Problem Solving: 2-Solves basic 25 - 49% of the time - needs direction more than half the time to initiate, plan or complete simple activities  Memory Memory: 1-Recognizes or recalls less than 25% of the time/requires cueing greater than 75% of the time  Medical Problem List and Plan:  1. 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 DVT Prophylaxis/Anticoagulation: SCDs. Monitor for signs of DVT. Will consider Lovenox therapy  3. Dysphagia. Dysphagia 1 diet with thin liquids as per modified barium swallow 08/23/2011. Monitor for any signs of aspiration. Followup speech therapy. . Monitor hydration  . Latest creatinine 1.18 4. Neuropsych: This patient is not capable of making decisions on her own behalf.  5. Diabetes mellitus. Hemoglobin A1c of 8.2. Lantus insulin 10 units each bedtime as well as NovoLog 10 units before lunch and supper and 5 units before breakfast . Hold parameters added to 10 unit dose to help avoid hypoglycemia. Generally her am cbg's have been better. Follow for now 6. Hypertension. Clonidine patch 0.2 mg weekly,.imdur 30 mg daily, Lopressor 25 mg twice a day. Monitor the increased mobility . Patient denies any dizziness when up for therapies. No chest pain or shortness of breath 7. Escherichia coli UTI. Continue Bactrim  Which was initiated 08/23/2011. No dysuria noted 8. CAD status post pacemaker. Patient denies any chest pain. Continue aspirin therapy 9.  Yeast vaginitis mycostatin  LOS  (Days) 6 A FACE TO FACE EVALUATION WAS PERFORMED  SWARTZ,ZACHARY T 08/29/2011, 10:03 AM

## 2011-08-30 ENCOUNTER — Inpatient Hospital Stay (HOSPITAL_COMMUNITY): Payer: Medicare Other | Admitting: Occupational Therapy

## 2011-08-30 ENCOUNTER — Inpatient Hospital Stay (HOSPITAL_COMMUNITY): Payer: Medicare Other | Admitting: Physical Therapy

## 2011-08-30 ENCOUNTER — Inpatient Hospital Stay (HOSPITAL_COMMUNITY): Payer: Medicare Other | Admitting: Speech Pathology

## 2011-08-30 LAB — GLUCOSE, CAPILLARY
Glucose-Capillary: 142 mg/dL — ABNORMAL HIGH (ref 70–99)
Glucose-Capillary: 127 mg/dL — ABNORMAL HIGH (ref 70–99)

## 2011-08-30 NOTE — Progress Notes (Signed)
Occupational Therapy Note  Patient Details  Name: Willie Loy MRN: [id number] Date of Birth: 25-May-1922 Today's Date: 08/30/2011 Time: 1130-1200 (30 min)  Pt seen for group session, [employer], with focus on self-feeding, forced use of LUE, and scanning to Lt.  Pt with ?increased Lt inattention initially during session.  Focus on increased use of LUE in holding items to open drink and magic cup.  Pt requiring mod cues to integrate LUE in self-feeding and stabilizing containers with feeding with dominant RUE.   Pt with no c/o pain this session.  [name] 08/30/2011, 12:27 PM

## 2011-08-30 NOTE — Progress Notes (Signed)
At 0400, patient complained of feeling SOB, not in respiratory distress. O2 sat 99% on RA. Pulled patient up in bed and elevated HOB without further complaint of SOB. Cindy Abbott A

## 2011-08-30 NOTE — Progress Notes (Signed)
Occupational Therapy Session Note  Patient Details  Name: Cindy Abbott MRN: [id number] Date of Birth: 10-07-1922  Today's Date: 08/30/2011 Time: 1032-1130 Time Calculation (min): 58 min  Short Term Goals: Week 1:  OT Short Term Goal 1 (Week 1): Pt will complete UB bathing with min assist OT Short Term Goal 2 (Week 1): Pt will complete LB bathing with mod assist OT Short Term Goal 3 (Week 1): Pt will complete UB dressing with min assist OT Short Term Goal 4 (Week 1): Pt will complete LB dressing with mod assist OT Short Term Goal 5 (Week 1): Pt will complete toilet transfer with mod assist  Skilled Therapeutic Interventions/Progress Updates:    Pt seen for ADL retraining at sink with focus on dynamic sitting balance, sit <> stand, upright standing, and forced use of LUE with self-care tasks.  Pt with ?increased Lt inattention with inability to locate LUE and disregard of LUE during self-care tasks.  Mod facilitation for sit to stand with LB bathing and dressing tasks, pt with forward trunk flexion in standing.  Pt required mod-max cues for sequencing and initiation throughout bathing task.  Pt required multiple rest breaks secondary to reports of inability to catch breath.  Therapy Documentation Precautions:  Precautions Precautions: Fall Precaution Comments: Dys 1, Nectar thick liquids, TEDs donned OOB Restrictions Weight Bearing Restrictions: No General:   Vital Signs: Oxygen Therapy SpO2: 100 % O2 Device: None (Room air) Pain: Pain Assessment Pain Assessment: No/denies pain  See FIM for current functional status  Therapy/Group: Individual Therapy  [name] 08/30/2011, 11:32 AM

## 2011-08-30 NOTE — Progress Notes (Signed)
Speech Language Pathology Daily Session Note  Patient Details  Name: Cindy Abbott MRN: [id number] Date of Birth: June 13, 1922  Today's Date: 08/30/2011 Time: 1200-1215 Time Calculation (min): 15 min  Short Term Goals: Week 1: SLP Short Term Goal 1 (Week 1): Patient will sustain attention to basic familiar task for 1 minute with mod assist semantic cues SLP Short Term Goal 1 - Progress (Week 1): Progressing toward goal SLP Short Term Goal 2 (Week 1): Patient will attend to left of environment with mod assist semantic cues during functional activities  SLP Short Term Goal 2 - Progress (Week 1): Progressing toward goal SLP Short Term Goal 3 (Week 1): Patient will consume Dys.1 textures and thin liquids with max assist verbal and tactile cues with no overt s/s of aspiration SLP Short Term Goal 3 - Progress (Week 1): Progressing toward goal SLP Short Term Goal 4 (Week 1): Patient will utilize external aids for orientation with max assist semantic cues SLP Short Term Goal 4 - Progress (Week 1): Progressing toward goal SLP Short Term Goal 5 (Week 1): Patient will increase speech intelligibility during sentence level expression with mod assist sematnic cues to utilize compensatory strategies SLP Short Term Goal 5 - Progress (Week 1): Progressing toward goal  Skilled Therapeutic Interventions: Co-treatment with OT; SLP facilitated session with supervision level verbal cues to utilize small bites and sips as well as to empty mouth prior to taking another bite. SLP observed intermittent wet vocal quality which patient was able to clear with max assist to utilize throat clears to remove suspected penetrates. Recommend trials of upgraded textures when son provides dentures (SLP requested them today via phone call).   FIM:  Comprehension Comprehension Mode: Auditory Comprehension: 3-Understands basic 50 - 74% of the time/requires cueing 25 - 50%  of the time Expression Expression Mode: Verbal Expression:  3-Expresses basic 50 - 74% of the time/requires cueing 25 - 50% of the time. Needs to repeat parts of sentences. Social Interaction Social Interaction: 4-Interacts appropriately 75 - 89% of the time - Needs redirection for appropriate language or to initiate interaction. Problem Solving Problem Solving: 2-Solves basic 25 - 49% of the time - needs direction more than half the time to initiate, plan or complete simple activities Memory Memory: 2-Recognizes or recalls 25 - 49% of the time/requires cueing 51 - 75% of the time  Pain Pain Assessment Pain Assessment: No/denies pain  Therapy/Group: Group Therapy  Charlane Ferretti., CCC-SLP [phone]  [name] 08/30/2011, 1:24 PM

## 2011-08-30 NOTE — Progress Notes (Signed)
Physical Therapy Weekly Progress Note  Patient Details  Name: Cindy Abbott MRN: [id number] Date of Birth: May 12, 1922  Today's Date: 08/30/2011 Time: 0865-7846 Time Calculation (min): 40 min  Patient is making slow progress towards PT LTG and has met 2 of 5 short term goals.  Patient is currently mod-max A overall for bed mobility on flat bed, bed <> w/c transfers stand pivot, and max-total A for standing and gait training.  Patient continues to have episodes of DOE with LLE collapse, LOB and falling to L with total A to remain standing and to safely transfer to chair or bed.  Secondary to impaired standing tolerance and lack of progress with ambulation, home ambulation goal D/C; patient will likely be w/c level upon D/C.  Will need to f/u with family to discuss accessibility of home to w/c and ability to care for patient at w/c level.    Patient continues to demonstrate the following deficits: impaired activity tolerance, endurance, DOE, L hemiplegia with impaired motor control, timing, sequencing, impaired proprioception with L inattention, impaired postural control in sitting and standing with impaired static and dynamic balance, gait and therefore will continue to benefit from skilled PT intervention to enhance overall performance with activity tolerance, balance, postural control, ability to compensate for deficits, functional use of  left upper extremity and left lower extremity, attention and coordination.  Patient not progressing toward long term goals.  See goal revision..  Plan of care revisions: Downgraded bed mobility goal to min A and D/C home ambulation goal .  PT Short Term Goals Week 1:  PT Short Term Goal 1 (Week 1): Patient will perform bed mobility flat bed to L and R with mod A PT Short Term Goal 1 - Progress (Week 1): Met PT Short Term Goal 2 (Week 1): Patient will perform bed <> w/c transfers to L and R with mod A PT Short Term Goal 2 - Progress (Week 1): Partly met PT Short Term  Goal 3 (Week 1): Patient will tolerate dynamic and static sitting balance and trunk control training without back support and mod A x 5-10 minutes PT Short Term Goal 3 - Progress (Week 1): Met PT Short Term Goal 4 (Week 1): Patient will perform standing balance and pre gait training with max A x 5 minutes PT Short Term Goal 4 - Progress (Week 1): Partly met PT Short Term Goal 5 (Week 1): Patient will perform gait x 25' with max A and LRAD in controlled environment PT Short Term Goal 5 - Progress (Week 1): Partly met Week 2:  PT Short Term Goal 1 (Week 2): Patient will perform gait x 25' with max A and LRAD in controlled environment  PT Short Term Goal 2 (Week 2): Patient will perform standing balance and pre gait training with max A x 5 minutes  PT Short Term Goal 3 (Week 2): Patient will perform bed <> w/c transfers to L and R with mod A PT Short Term Goal 4 (Week 2): Patient will perform bed mobility on flat bed to L and R with min A  Skilled Therapeutic Interventions/Progress Updates:   Patient resting in hospital bed; patient performed bed mobility in hospital bed with mod-max A secondary to feeling anxious about falling off the edge of the bed; transfers bed > w/c with stand pivot max A with decreased ability to advance LLE today.  In ADL apartment attempted to perform gait training x 10' to bed with RW but patient became very SOB and experienced  significant LOB/falling to L, decreased ability to advance LLE or stabilize on LLE in stance and required +2 A to transfer to EOB safely; assess Sp02: 93%.  Performed multiple scoots to L and sit > supine with min-mod A; bed mobility and rolling to L and R training on wider bed to increased patient's confidence with focus on verbal cues for sequence of flexion of bilat LE and use of lower and upper trunk rotation with chin tuck, trunk flexion and reaching across midline with UE for full rotation to L and R; patient still fearful of falling but able to  perform with min-mod A overall.  Side to sit EOB with mod A; transfer back to w/c to L with max stand pivot.   Therapy Documentation Precautions:  Precautions Precautions: Fall Precaution Comments: Dys 1, Nectar thick liquids, TEDs donned OOB Restrictions Weight Bearing Restrictions: No Vital Signs: Therapy Vitals Temp: 97.9 F (36.6 C) Temp src: Oral Pulse Rate: 60  Resp: 18  BP: 120/67 mmHg Patient Position, if appropriate: Sitting Oxygen Therapy SpO2: 100 % O2 Device: None (Room air) Pain: Pain Assessment Pain Assessment: No/denies pain  See FIM for current functional status  Therapy/Group: Individual Therapy  Edman Circle [facility] 08/30/2011, 5:11 PM

## 2011-08-30 NOTE — Progress Notes (Signed)
Speech Language Pathology Daily Session Note  Patient Details  Name: Cindy Abbott MRN: [id number] Date of Birth: November 28, 1922  Today's Date: 08/30/2011 Time: 0935-1000 Time Calculation (min): 25 min  Short Term Goals: Week 1: SLP Short Term Goal 1 (Week 1): Patient will sustain attention to basic familiar task for 1 minute with mod assist semantic cues SLP Short Term Goal 1 - Progress (Week 1): Progressing toward goal SLP Short Term Goal 2 (Week 1): Patient will attend to left of environment with mod assist semantic cues during functional activities  SLP Short Term Goal 2 - Progress (Week 1): Progressing toward goal SLP Short Term Goal 3 (Week 1): Patient will consume Dys.1 textures and thin liquids with max assist verbal and tactile cues with no overt s/s of aspiration SLP Short Term Goal 3 - Progress (Week 1): Progressing toward goal SLP Short Term Goal 4 (Week 1): Patient will utilize external aids for orientation with max assist semantic cues SLP Short Term Goal 4 - Progress (Week 1): Progressing toward goal SLP Short Term Goal 5 (Week 1): Patient will increase speech intelligibility during sentence level expression with mod assist sematnic cues to utilize compensatory strategies SLP Short Term Goal 5 - Progress (Week 1): Progressing toward goal  Skilled Therapeutic Interventions: Treatment session focused on addressing cognition during session.  SLP facilitated session with discussion focused on orientation and current deficits.  Patient orientated to person, place and aware of CVA but required mod assist cues to recall day of the week throughout session and max assist to label deficits that resulted from CVA.  Patient demonstrated improved recall of daily routine by requesting dentures and stating that she wore them at home.  Via phone call to son he confirmed that his mother wore dentures daily and consumed regular textures PTA.  As a result, SLP requested he bring dentures in so that SLP can  assess upgraded textures prior to discharge to reduce burden of care for son; son agreed.    FIM:  Comprehension Comprehension Mode: Auditory Comprehension: 3-Understands basic 50 - 74% of the time/requires cueing 25 - 50%  of the time Expression Expression Mode: Verbal Expression: 3-Expresses basic 50 - 74% of the time/requires cueing 25 - 50% of the time. Needs to repeat parts of sentences. Social Interaction Social Interaction: 4-Interacts appropriately 75 - 89% of the time - Needs redirection for appropriate language or to initiate interaction. Problem Solving Problem Solving: 2-Solves basic 25 - 49% of the time - needs direction more than half the time to initiate, plan or complete simple activities Memory Memory: 2-Recognizes or recalls 25 - 49% of the time/requires cueing 51 - 75% of the time  Pain Pain Assessment Pain Assessment: No/denies pain  Therapy/Group: Individual Therapy  Charlane Ferretti., CCC-SLP [phone]  Cindy Abbott 08/30/2011, 11:25 AM

## 2011-08-30 NOTE — Progress Notes (Signed)
Patient ID: Cindy Abbott, female   DOB: [birth year], 78 y.o.   MRN: [id number]  Subjective/Complaints: No dysuria.  Eating ok with supervision  Review of Systems  Genitourinary:       Retention  All other systems reviewed and are negative.     Objective: Vital Signs: Blood pressure 129/56, pulse 60, temperature 98.1 F (36.7 C), temperature source Oral, resp. rate 18, height 5\' 5"  (1.651 m), weight 66.2 kg (145 lb 15.1 oz), SpO2 100.00%. No results found. Results for orders placed during the hospital encounter of 08/23/11 (from the past 72 hour(s))  BASIC METABOLIC PANEL     Status: Abnormal   Collection Time   08/27/11  9:45 AM      Component Value Range Comment   Sodium 135  135 - 145 mEq/L    Potassium 4.8  3.5 - 5.1 mEq/L    Chloride 102  96 - 112 mEq/L    CO2 23  19 - 32 mEq/L    Glucose, Bld 140 (*) 70 - 99 mg/dL    BUN 12  6 - 23 mg/dL    Creatinine, Ser 4.09 (*) 0.50 - 1.10 mg/dL    Calcium 9.0  8.4 - 81.1 mg/dL    GFR calc non Af Amer 37 (*) >90 mL/min    GFR calc Af Amer 43 (*) >90 mL/min   GLUCOSE, CAPILLARY     Status: Abnormal   Collection Time   08/27/11 11:32 AM      Component Value Range Comment   Glucose-Capillary 118 (*) 70 - 99 mg/dL    Comment 1 Notify RN     GLUCOSE, CAPILLARY     Status: Normal   Collection Time   08/27/11  4:30 PM      Component Value Range Comment   Glucose-Capillary 93  70 - 99 mg/dL    Comment 1 Notify RN     GLUCOSE, CAPILLARY     Status: Abnormal   Collection Time   08/27/11  8:46 PM      Component Value Range Comment   Glucose-Capillary 141 (*) 70 - 99 mg/dL   GLUCOSE, CAPILLARY     Status: Abnormal   Collection Time   08/28/11  7:33 AM      Component Value Range Comment   Glucose-Capillary 112 (*) 70 - 99 mg/dL   GLUCOSE, CAPILLARY     Status: Abnormal   Collection Time   08/28/11 11:30 AM      Component Value Range Comment   Glucose-Capillary 144 (*) 70 - 99 mg/dL   GLUCOSE, CAPILLARY     Status: Abnormal   Collection  Time   08/28/11  4:28 PM      Component Value Range Comment   Glucose-Capillary 128 (*) 70 - 99 mg/dL   GLUCOSE, CAPILLARY     Status: Abnormal   Collection Time   08/28/11  8:40 PM      Component Value Range Comment   Glucose-Capillary 138 (*) 70 - 99 mg/dL    Comment 1 Notify RN     GLUCOSE, CAPILLARY     Status: Normal   Collection Time   08/29/11  7:26 AM      Component Value Range Comment   Glucose-Capillary 77  70 - 99 mg/dL    Comment 1 Notify RN     GLUCOSE, CAPILLARY     Status: Abnormal   Collection Time   08/29/11 11:16 AM      Component Value Range Comment  Glucose-Capillary 136 (*) 70 - 99 mg/dL    Comment 1 Notify RN     GLUCOSE, CAPILLARY     Status: Abnormal   Collection Time   08/29/11  4:01 PM      Component Value Range Comment   Glucose-Capillary 137 (*) 70 - 99 mg/dL    Comment 1 Notify RN     GLUCOSE, CAPILLARY     Status: Abnormal   Collection Time   08/29/11  8:45 PM      Component Value Range Comment   Glucose-Capillary 142 (*) 70 - 99 mg/dL    Comment 1 Notify RN     GLUCOSE, CAPILLARY     Status: Abnormal   Collection Time   08/30/11  7:32 AM      Component Value Range Comment   Glucose-Capillary 142 (*) 70 - 99 mg/dL      HEENT: normal Cardio: RRR Resp: CTA B/L GI: BS positive Extremity:  No Edema Skin:   Intact Neuro: Confused, Flat, Normal Sensory and Abnormal Motor 4/5 on Left side UE and LE Musc/Skel:  Normal Skin is quite dry  Assessment/Plan: 1. Functional deficits secondary to R ICA infarct with L HP, Mild L neglect which require 3+ hours per day of interdisciplinary therapy in a comprehensive inpatient rehab setting. Physiatrist is providing close team supervision and 24 hour management of active medical problems listed below. Physiatrist and rehab team continue to assess barriers to discharge/monitor patient progress toward functional and medical goals. FIM: FIM - Bathing Bathing Steps Patient Completed: Chest;Right Arm;Left  Arm;Abdomen;Right upper leg;Left upper leg;Right lower leg (including foot);Left lower leg (including foot) Bathing: 4: Min-Patient completes 8-9 70f 10 parts or 75+ percent  FIM - Upper Body Dressing/Undressing Upper body dressing/undressing steps patient completed: Thread/unthread left sleeve of front closure shirt/dress Upper body dressing/undressing: 2: Max-Patient completed 25-49% of tasks FIM - Lower Body Dressing/Undressing Lower body dressing/undressing steps patient completed: Thread/unthread left pants leg;Thread/unthread right pants leg Lower body dressing/undressing: 2: Max-Patient completed 25-49% of tasks  FIM - Toileting Toileting: 1: Total-Patient completed zero steps, helper did all 3  FIM - Diplomatic Services operational officer Devices: Building control surveyor Transfers: 3-To toilet/BSC: Mod A (lift or lower assist);3-From toilet/BSC: Mod A (lift or lower assist)  FIM - Bed/Chair Transfer Bed/Chair Transfer Assistive Devices: Bed rails;Arm rests Bed/Chair Transfer: 5: Supine > Sit: Supervision (verbal cues/safety issues);2: Bed > Chair or W/C: Max A (lift and lower assist);3: Chair or W/C > Bed: Mod A (lift or lower assist)  FIM - Locomotion: Wheelchair Distance: 150 Locomotion: Wheelchair: 1: Total Assistance/staff pushes wheelchair (Pt<25%) FIM - Locomotion: Ambulation Locomotion: Ambulation Assistive Devices: Designer, industrial/product Ambulation/Gait Assistance: 3: Mod assist Locomotion: Ambulation: 0: Activity did not occur  Comprehension Comprehension Mode: Auditory Comprehension: 3-Understands basic 50 - 74% of the time/requires cueing 25 - 50%  of the time  Expression Expression Mode: Verbal Expression: 4-Expresses basic 75 - 89% of the time/requires cueing 10 - 24% of the time. Needs helper to occlude trach/needs to repeat words.  Social Interaction Social Interaction: 4-Interacts appropriately 75 - 89% of the time - Needs redirection for appropriate  language or to initiate interaction.  Problem Solving Problem Solving: 2-Solves basic 25 - 49% of the time - needs direction more than half the time to initiate, plan or complete simple activities  Memory Memory: 1-Recognizes or recalls less than 25% of the time/requires cueing greater than 75% of the time  Medical Problem List and Plan:  1. 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 DVT Prophylaxis/Anticoagulation: SCDs. Monitor for signs of DVT. Will consider Lovenox therapy  3. Dysphagia. Dysphagia 1 diet with thin liquids as per modified barium swallow 08/23/2011. Monitor for any signs of aspiration. Followup speech therapy. . Monitor hydration  . Latest creatinine 1.18 4. Neuropsych: This patient is not capable of making decisions on her own behalf.  5. Diabetes mellitus. Controlled Hemoglobin A1c of 8.2. Lantus insulin 10 units each bedtime as well as NovoLog 10 units before lunch and supper and 5 units before breakfast . Hold parameters added to 10 unit dose to help avoid hypoglycemia. Generally her am cbg's have been better. Follow for now 6. Hypertension. Clonidine patch 0.2 mg weekly,.imdur 30 mg daily, Lopressor 25 mg twice a day. Monitor the increased mobility . Patient denies any dizziness when up for therapies. No chest pain or shortness of breath 7. Escherichia coli UTI. D/C Bactrim  Which was initiated 08/23/2011. No dysuria noted 8. CAD status post pacemaker. Patient denies any chest pain. Continue aspirin therapy 9.  Yeast vaginitis mycostatin  LOS (Days) 7 A FACE TO FACE EVALUATION WAS PERFORMED  KIRSTEINS,ANDREW E 08/30/2011, 8:30 AM

## 2011-08-31 ENCOUNTER — Inpatient Hospital Stay (HOSPITAL_COMMUNITY): Payer: Medicare Other | Admitting: Speech Pathology

## 2011-08-31 ENCOUNTER — Inpatient Hospital Stay (HOSPITAL_COMMUNITY): Payer: Medicare Other | Admitting: Occupational Therapy

## 2011-08-31 ENCOUNTER — Inpatient Hospital Stay (HOSPITAL_COMMUNITY): Payer: Medicare Other | Admitting: Physical Therapy

## 2011-08-31 LAB — GLUCOSE, CAPILLARY
Glucose-Capillary: 104 mg/dL — ABNORMAL HIGH (ref 70–99)
Glucose-Capillary: 59 mg/dL — ABNORMAL LOW (ref 70–99)
Glucose-Capillary: 72 mg/dL (ref 70–99)
Glucose-Capillary: 191 mg/dL — ABNORMAL HIGH (ref 70–99)

## 2011-08-31 NOTE — Progress Notes (Signed)
Patient ID: Cindy Abbott, female   DOB: 09/27/1922, 78 y.o.   MRN: [id number]  Subjective/Complaints: No dysuria.  Eating ok with supervision.  Cont of B and B per nursing.  Discussed medical status with pt's son from [place]  Review of Systems  Genitourinary:       Retention  All other systems reviewed and are negative.     Objective: Vital Signs: Blood pressure 130/62, pulse 78, temperature 98.2 F (36.8 C), temperature source Oral, resp. rate 16, height 5\' 5"  (1.651 m), weight 66.2 kg (145 lb 15.1 oz), SpO2 100.00%. No results found. Results for orders placed during the hospital encounter of 08/23/11 (from the past 72 hour(s))  GLUCOSE, CAPILLARY     Status: Abnormal   Collection Time   08/28/11 11:30 AM      Component Value Range Comment   Glucose-Capillary 144 (*) 70 - 99 mg/dL   GLUCOSE, CAPILLARY     Status: Abnormal   Collection Time   08/28/11  4:28 PM      Component Value Range Comment   Glucose-Capillary 128 (*) 70 - 99 mg/dL   GLUCOSE, CAPILLARY     Status: Abnormal   Collection Time   08/28/11  8:40 PM      Component Value Range Comment   Glucose-Capillary 138 (*) 70 - 99 mg/dL    Comment 1 Notify RN     GLUCOSE, CAPILLARY     Status: Normal   Collection Time   08/29/11  7:26 AM      Component Value Range Comment   Glucose-Capillary 77  70 - 99 mg/dL    Comment 1 Notify RN     GLUCOSE, CAPILLARY     Status: Abnormal   Collection Time   08/29/11 11:16 AM      Component Value Range Comment   Glucose-Capillary 136 (*) 70 - 99 mg/dL    Comment 1 Notify RN     GLUCOSE, CAPILLARY     Status: Abnormal   Collection Time   08/29/11  4:01 PM      Component Value Range Comment   Glucose-Capillary 137 (*) 70 - 99 mg/dL    Comment 1 Notify RN     GLUCOSE, CAPILLARY     Status: Abnormal   Collection Time   08/29/11  8:45 PM      Component Value Range Comment   Glucose-Capillary 142 (*) 70 - 99 mg/dL    Comment 1 Notify RN     GLUCOSE, CAPILLARY     Status: Abnormal   Collection Time   08/30/11  7:32 AM      Component Value Range Comment   Glucose-Capillary 142 (*) 70 - 99 mg/dL   GLUCOSE, CAPILLARY     Status: Abnormal   Collection Time   08/30/11 11:23 AM      Component Value Range Comment   Glucose-Capillary 127 (*) 70 - 99 mg/dL    Comment 1 Notify RN     GLUCOSE, CAPILLARY     Status: Abnormal   Collection Time   08/30/11  4:09 PM      Component Value Range Comment   Glucose-Capillary 127 (*) 70 - 99 mg/dL   GLUCOSE, CAPILLARY     Status: Abnormal   Collection Time   08/30/11  8:49 PM      Component Value Range Comment   Glucose-Capillary 159 (*) 70 - 99 mg/dL   GLUCOSE, CAPILLARY     Status: Abnormal   Collection Time  08/31/11  7:12 AM      Component Value Range Comment   Glucose-Capillary 136 (*) 70 - 99 mg/dL    Comment 1 Notify RN        HEENT: normal Cardio: RRR Resp: CTA B/L GI: BS positive Extremity:  No Edema Skin:   Intact Neuro: Confused, Flat, Normal Sensory and Abnormal Motor 4/5 on Left side UE and LE Musc/Skel:  Normal Skin is quite dry  Assessment/Plan: 1. Functional deficits secondary to R ICA infarct with L HP, Mild L neglect which require 3+ hours per day of interdisciplinary therapy in a comprehensive inpatient rehab setting. Physiatrist is providing close team supervision and 24 hour management of active medical problems listed below. Physiatrist and rehab team continue to assess barriers to discharge/monitor patient progress toward functional and medical goals. FIM: FIM - Bathing Bathing Steps Patient Completed: Chest;Right Arm;Left Arm;Abdomen;Front perineal area;Buttocks;Right upper leg;Left upper leg Bathing: 4: Min-Patient completes 8-9 87f 10 parts or 75+ percent  FIM - Upper Body Dressing/Undressing Upper body dressing/undressing steps patient completed: Thread/unthread right sleeve of pullover shirt/dresss;Thread/unthread left sleeve of pullover shirt/dress;Put head through opening of pull over  shirt/dress Upper body dressing/undressing: 4: Min-Patient completed 75 plus % of tasks FIM - Lower Body Dressing/Undressing Lower body dressing/undressing steps patient completed: Thread/unthread right underwear leg;Thread/unthread left underwear leg;Thread/unthread right pants leg;Thread/unthread left pants leg Lower body dressing/undressing: 3: Mod-Patient completed 50-74% of tasks  FIM - Toileting Toileting: 1: Total-Patient completed zero steps, helper did all 3  FIM - Diplomatic Services operational officer Devices: Building control surveyor Transfers: 3-To toilet/BSC: Mod A (lift or lower assist);3-From toilet/BSC: Mod A (lift or lower assist)  FIM - Bed/Chair Transfer Bed/Chair Transfer Assistive Devices: Bed rails;Arm rests Bed/Chair Transfer: 2: Bed > Chair or W/C: Max A (lift and lower assist);2: Chair or W/C > Bed: Max A (lift and lower assist)  FIM - Locomotion: Wheelchair Distance: 150 Locomotion: Wheelchair: 1: Total Assistance/staff pushes wheelchair (Pt<25%) FIM - Locomotion: Ambulation Locomotion: Ambulation Assistive Devices: Designer, industrial/product Ambulation/Gait Assistance: 1: +1 Total assist Locomotion: Ambulation: 1: Travels less than 50 ft with total assistance/helper does all (Pt.<25%)  Comprehension Comprehension Mode: Auditory Comprehension: 3-Understands basic 50 - 74% of the time/requires cueing 25 - 50%  of the time  Expression Expression Mode: Verbal Expression: 3-Expresses basic 50 - 74% of the time/requires cueing 25 - 50% of the time. Needs to repeat parts of sentences.  Social Interaction Social Interaction: 4-Interacts appropriately 75 - 89% of the time - Needs redirection for appropriate language or to initiate interaction.  Problem Solving Problem Solving: 2-Solves basic 25 - 49% of the time - needs direction more than half the time to initiate, plan or complete simple activities  Memory Memory: 2-Recognizes or recalls 25 - 49% of the  time/requires cueing 51 - 75% of the time  Medical Problem List and Plan:  1. 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 DVT Prophylaxis/Anticoagulation: SCDs. Monitor for signs of DVT. Will consider Lovenox therapy  3. Dysphagia. Dysphagia 1 diet with thin liquids as per modified barium swallow 08/23/2011. Monitor for any signs of aspiration. Followup speech therapy. . Monitor hydration  . Latest creatinine 1.18 4. Neuropsych: This patient is not capable of making decisions on her own behalf.  5. Diabetes mellitus. Controlled Hemoglobin A1c of 8.2. Lantus insulin 10 units each bedtime as well as NovoLog 10 units before lunch and supper and 5 units before breakfast . Hold parameters added to  10 unit dose to help avoid hypoglycemia. Generally her am cbg's have been better. Follow for now 6. Hypertension. Clonidine patch 0.2 mg weekly,.imdur 30 mg daily, Lopressor 25 mg twice a day. Monitor the increased mobility . Patient denies any dizziness when up for therapies. No chest pain or shortness of breath 7. Escherichia coli UTI. D/C Bactrim  Which was initiated 08/23/2011. No dysuria noted 8. CAD status post pacemaker. Patient denies any chest pain. Continue aspirin therapy 9.  Yeast vaginitis mycostatin  LOS (Days) 8 A FACE TO FACE EVALUATION WAS PERFORMED  Amor Hyle E 08/31/2011, 8:16 AM

## 2011-08-31 NOTE — Progress Notes (Signed)
Speech Language Pathology Daily Session Note  Patient Details  Name: Cindy Abbott MRN: [id number] Date of Birth: 11-13-1922  Today's Date: 08/31/2011 Time: 4098-1191 Time Calculation (min): 30 min  Short Term Goals: Week 1: SLP Short Term Goal 1 (Week 1): Patient will sustain attention to basic familiar task for 1 minute with mod assist semantic cues SLP Short Term Goal 1 - Progress (Week 1): Progressing toward goal SLP Short Term Goal 2 (Week 1): Patient will attend to left of environment with mod assist semantic cues during functional activities  SLP Short Term Goal 2 - Progress (Week 1): Progressing toward goal SLP Short Term Goal 3 (Week 1): Patient will consume Dys.1 textures and thin liquids with max assist verbal and tactile cues with no overt s/s of aspiration SLP Short Term Goal 3 - Progress (Week 1): Progressing toward goal SLP Short Term Goal 4 (Week 1): Patient will utilize external aids for orientation with max assist semantic cues SLP Short Term Goal 4 - Progress (Week 1): Progressing toward goal SLP Short Term Goal 5 (Week 1): Patient will increase speech intelligibility during sentence level expression with mod assist sematnic cues to utilize compensatory strategies SLP Short Term Goal 5 - Progress (Week 1): Progressing toward goal  Skilled Therapeutic Interventions: Co-treatment with OT; SLP facilitated session with supervision level verbal cues to utilize thin liquid sips to assist with reducing oral residue with Dys.2 textures and upper dentures in place.   SLP observed intermittent wet vocal quality which patient was able to clear with max faded to mod assist verbal and demonstrative cues to utilize throat clears to remove suspected penetrates. Continue with current plan of care; SLP will continue to follow.   FIM:  Comprehension Comprehension Mode: Auditory Comprehension: 4-Understands basic 75 - 89% of the time/requires cueing 10 - 24% of the time Expression Expression  Mode: Verbal Expression: 4-Expresses basic 75 - 89% of the time/requires cueing 10 - 24% of the time. Needs helper to occlude trach/needs to repeat words. Social Interaction Social Interaction: 5-Interacts appropriately 90% of the time - Needs monitoring or encouragement for participation or interaction. Problem Solving Problem Solving: 2-Solves basic 25 - 49% of the time - needs direction more than half the time to initiate, plan or complete simple activities Memory Memory: 2-Recognizes or recalls 25 - 49% of the time/requires cueing 51 - 75% of the time FIM - Eating Eating Activity: 5: Set-up assist for open containers;5: Supervision/cues  Pain Pain Assessment Pain Assessment: No/denies pain  Therapy/Group: Group Therapy  Charlane Ferretti., CCC-SLP [phone]  Cindy Abbott 08/31/2011, 4:34 PM

## 2011-08-31 NOTE — Progress Notes (Signed)
Occupational Therapy Session Note  Patient Details  Name: Cindy Abbott MRN: [id number] Date of Birth: 1922/10/26  Today's Date: 08/31/2011 Time: 1130-1145 Time Calculation (min): 15 min  Pt seen for group session, [employer], with SLP with focus on Lt attention and increased use of LUE with self-feeding tasks.  Pt with improved carryover of ADL session with increased use of LUE to hold containers as needed and scan to Lt environment to obtain items.  Pt with increased PO intake this session.  Pain: Pain Assessment Pain Assessment: No/denies pain  Therapy/Group: Group Therapy  [name] 08/31/2011, 3:21 PM

## 2011-08-31 NOTE — Progress Notes (Signed)
Physical Therapy Session Note  Patient Details  Name: Cindy Abbott MRN: [id number] Date of Birth: Jun 16, 1922  Today's Date: 08/31/2011 Time: 1600-1650 Time Calculation (min): 50 min  Short Term Goals: Week 2:  PT Short Term Goal 1 (Week 2): Patient will perform gait x 25' with max A and LRAD in controlled environment  PT Short Term Goal 2 (Week 2): Patient will perform standing balance and pre gait training with max A x 5 minutes  PT Short Term Goal 3 (Week 2): Patient will perform bed <> w/c transfers to L and R with mod A PT Short Term Goal 4 (Week 2): Patient will perform bed mobility on flat bed to L and R with min A  Skilled Therapeutic Interventions/Progress Updates:   Patient awakened and slightly disoriented to time; reoriented patient to time and place; patient requesting to use BSC.  Performed bed mobility with rails with mod-max A for full roll to R side and side to sit; performed stand pivot transfers bed <> BSC and bed <> w/c with max A for lifting, lowering and weight shifting assistance to allow patient to advance LLE; patient stood and performed hygiene with max A but required assistance for clothing doffing and donning.  Patient performed prolonged standing at tall table x 2 reps with UE support on table and mod-max A and verbal and tactile cues to maintain extensor activation of LLE while LUE performed reaching up and forwards to play checkers.  Patient c/o being very hungry; asked nurse tech to assess CBG which was low; assisted patient with eating of snack to return CBG to safe level for mobility.     Therapy Documentation Precautions:  Precautions Precautions: Fall Precaution Comments: Dys 1, Nectar thick liquids, TEDs donned OOB Restrictions Weight Bearing Restrictions: No Vital Signs: Therapy Vitals Temp: 98.2 F (36.8 C) Temp src: Oral Pulse Rate: 62  Resp: 18  BP: 129/73 mmHg Patient Position, if appropriate: Lying Oxygen Therapy SpO2: 100 % O2 Device: None  (Room air) Pain: Pain Assessment Pain Assessment: No/denies pain  See FIM for current functional status  Therapy/Group: Individual Therapy  Edman Circle [facility] 08/31/2011, 5:41 PM

## 2011-08-31 NOTE — Progress Notes (Signed)
Speech Language Pathology Daily Session Note & Weekly Progress Update  Patient Details  Name: Stephie Xu MRN: [id number] Date of Birth: 06/19/1922  Today's Date: 08/31/2011 Time: 0865-7846 Time Calculation (min): 30 min  Short Term Goals: Week 1: SLP Short Term Goal 1 (Week 1): Patient will sustain attention to basic familiar task for 1 minute with mod assist semantic cues SLP Short Term Goal 1 - Progress (Week 1): Progressing toward goal SLP Short Term Goal 2 (Week 1): Patient will attend to left of environment with mod assist semantic cues during functional activities  SLP Short Term Goal 2 - Progress (Week 1): Progressing toward goal SLP Short Term Goal 3 (Week 1): Patient will consume Dys.1 textures and thin liquids with max assist verbal and tactile cues with no overt s/s of aspiration SLP Short Term Goal 3 - Progress (Week 1): Progressing toward goal SLP Short Term Goal 4 (Week 1): Patient will utilize external aids for orientation with max assist semantic cues SLP Short Term Goal 4 - Progress (Week 1): Progressing toward goal SLP Short Term Goal 5 (Week 1): Patient will increase speech intelligibility during sentence level expression with mod assist sematnic cues to utilize compensatory strategies SLP Short Term Goal 5 - Progress (Week 1): Progressing toward goal  Skilled Therapeutic Interventions: Treatment session focused on addressing dysphagia goals. SLP facilitated session with set up of upper dentures (that were provided by son) and minimal assist verbal cues to consume small bites and utilize thin liquid cup sips to soften Dys.3 texture trials.  Patient performed most of mastication with front teeth and required increased wait time. Patient demonstrated no overt s/s of aspiration with trials and as a result it is recommended that patient now consume Dys.2 textures.    FIM:  Comprehension Comprehension Mode: Auditory Comprehension: 4-Understands basic 75 - 89% of the  time/requires cueing 10 - 24% of the time Expression Expression Mode: Verbal Expression: 4-Expresses basic 75 - 89% of the time/requires cueing 10 - 24% of the time. Needs helper to occlude trach/needs to repeat words. Social Interaction Social Interaction: 5-Interacts appropriately 90% of the time - Needs monitoring or encouragement for participation or interaction. Problem Solving Problem Solving: 2-Solves basic 25 - 49% of the time - needs direction more than half the time to initiate, plan or complete simple activities Memory Memory: 2-Recognizes or recalls 25 - 49% of the time/requires cueing 51 - 75% of the time FIM - Eating Eating Activity: 5: Set-up assist for open containers;5: Supervision/cues  Pain Pain Assessment Pain Assessment: No/denies pain  Therapy/Group: Individual Therapy   Speech Language Pathology Weekly Progress Note  Patient Details  Name: Daryana Whirley MRN: [id number] Date of Birth: Mar 15, 1922  Today's Date: 09/01/2011  Short Term Goals: Week 1: SLP Short Term Goal 1 (Week 1): Patient will sustain attention to basic familiar task for 1 minute with mod assist semantic cues SLP Short Term Goal 1 - Progress (Week 1): Met SLP Short Term Goal 2 (Week 1): Patient will attend to left of environment with mod assist semantic cues during functional activities  SLP Short Term Goal 2 - Progress (Week 1): Progressing toward goal SLP Short Term Goal 3 (Week 1): Patient will consume Dys.1 textures and thin liquids with max assist verbal and tactile cues with no overt s/s of aspiration SLP Short Term Goal 3 - Progress (Week 1): Met SLP Short Term Goal 4 (Week 1): Patient will utilize external aids for orientation with max assist semantic cues SLP Short Term  Goal 4 - Progress (Week 1): Met SLP Short Term Goal 5 (Week 1): Patient will increase speech intelligibility during sentence level expression with mod assist sematnic cues to utilize compensatory strategies SLP Short Term  Goal 5 - Progress (Week 1): Met Week 2: SLP Short Term Goal 1 (Week 2): Patient will consume Dys.2 textures and thin liquids with mod assist to utilize compensatory strategies to prevent overt s/s of aspiraiton  SLP Short Term Goal 2 (Week 2): Patient will consume trials of Dys.3 textures with mod assist verbal cues for efficient mastication and to monitor oral residue. SLP Short Term Goal 3 (Week 2): Patient will attend to left upper extremity with mod assist semantic cues during functional activities.  SLP Short Term Goal 4 (Week 2): Patient will attend to left of environment with mod assist semantic cues during functional tasks. SLP Short Term Goal 5 (Week 2): Patient will utilize external aids for orientation with mod assist semantic cues.  Weekly Progress Updates: Patient met 4 out of 5 short term objective this reporting period with gains in orientation, sustained attention to basic familiar tasks, speech intelligibility and dysphagia management.  While orientation fluctuates throughout the day patient is more aware of being in the hospital due to a CVA.  She is also beginning to request things such as her dentures which indicates increased recall and return to her routine.  Patient's diet was also advanced from Dys.1 to Dys.2 textures this week. Patient is currently able to select attention to self-feeding with supervision cues and requires supervision-min assist verbal cues (which vary based upon environment) to increase vocal intensity for speech intelligibly.  Considering all current deficits and recommendation for 24/7 supervision upon discharge these 2 goals are adequate for discharge at this time and SLP will focus therapy on dysphagia and left attention for the remainder of sessions.  As a result, it is recommended that this patient continue to receive skilled SLP services to maximize swallow function, left attention and overall orientation and awareness.     SLP Frequency: 1-2 X/day, 30-60  minutes;5 out of 7 days Estimated Length of Stay: anticipated discharge 9/10 SLP Treatment/Interventions: Cognitive remediation/compensation;Cueing hierarchy;Dysphagia/aspiration precaution training;Environmental controls;Functional tasks;Internal/external aids;Patient/family education;Speech/Language facilitation;Therapeutic Activities;Therapeutic Exercise;Other (comment) (group treatment sessions)  Charlane Ferretti., CCC-SLP [phone]  Dillon Livermore 08/31/2011, 10:07 AM

## 2011-08-31 NOTE — Progress Notes (Signed)
Occupational Therapy Weekly Progress Note and Treatment Session  Patient Details  Name: Cindy Abbott MRN: [id number] Date of Birth: 18-Apr-1922  Today's Date: 08/31/2011 Time: 1030-1130 Time Calculation (min): 60 min  Patient has met 5 of 5 short term goals.  Pt is demonstrating fluctuations in progress towards LTGs.  Pt has demonstrated improvements in self-care tasks/ADLs with improving from max-total assist to mod assist overall.  Pt continues to require cues for initiation and sequencing and cues through bouts of confusion, exacerbated immediately upon awakening.  Pt continues to require frequent rest breaks during sessions secondary to SOB (h/o COPD and asthma).   Patient continues to demonstrate the following deficits: decreased activity tolerance, Lt hemiplegia with impaired motor control, Lt inattention, impaired proprioception, decreased dynamic sitting and standing, and postural control and therefore will continue to benefit from skilled OT intervention to enhance overall performance with BADL and Reduce care partner burden.  Patient progressing toward long term goals..  Continue plan of care.  OT Short Term Goals Week 1:  OT Short Term Goal 1 (Week 1): Pt will complete UB bathing with min assist OT Short Term Goal 1 - Progress (Week 1): Met OT Short Term Goal 2 (Week 1): Pt will complete LB bathing with mod assist OT Short Term Goal 2 - Progress (Week 1): Met OT Short Term Goal 3 (Week 1): Pt will complete UB dressing with min assist OT Short Term Goal 3 - Progress (Week 1): Met OT Short Term Goal 4 (Week 1): Pt will complete LB dressing with mod assist OT Short Term Goal 4 - Progress (Week 1): Met OT Short Term Goal 5 (Week 1): Pt will complete toilet transfer with mod assist OT Short Term Goal 5 - Progress (Week 1): Met Week 2:  OT Short Term Goal 1 (Week 2): Pt will complete  UB dressing with setup and min cues for sequencing. OT Short Term Goal 2 (Week 2): Pt will complete LB  dressing with min/steadying assist. OT Short Term Goal 3 (Week 2): Pt will complete toilet transfer with min assist with LRAD. OT Short Term Goal 4 (Week 2): Pt will complete grooming with setup assist with LUE at diminished level.  Skilled Therapeutic Interventions/Progress Updates:    Pt seen for ADL retraining at sink with focus on sit <> stand, dynamic sitting balance, and static standing balance.  Pt continues to require rest breaks throughout session due to h/o COPD and asthma as she tends to get SOB with bending forward and in standing.  Pt completed UB bathing this session with minimal cues for initiation and sequencing.  Pt required min/steady assist in standing with perineal care and pulling up pants.  Pt demonstrated increased attention to task and asking if she can assist with pulling up TEDS.  Pt completed LB dressing with min assist to pull up underwear and steadying assist in standing.  Pt with forward posture in standing, requiring cues for improved upright standing posture.    Therapy Documentation Precautions:  Precautions Precautions: Fall Precaution Comments: Dys 1, Nectar thick liquids, TEDs donned OOB Restrictions Weight Bearing Restrictions: No Pain: Pain Assessment Pain Assessment: No/denies pain ADL: ADL Grooming: Moderate assistance Where Assessed-Grooming: Sitting at sink Upper Body Bathing: Supervision/safety;Minimal cueing Where Assessed-Upper Body Bathing: Sitting at sink Lower Body Bathing: Minimal assistance Where Assessed-Lower Body Bathing: Standing at sink;Sitting at sink Upper Body Dressing: Minimal assistance Where Assessed-Upper Body Dressing: Sitting at sink Lower Body Dressing: Minimal assistance Where Assessed-Lower Body Dressing: Sitting at sink;Standing at  sink Toileting: Moderate assistance Where Assessed-Toileting: Teacher, adult education: Moderate assistance Toilet Transfer Method: Stand pivot Toilet Transfer Equipment: Bedside  commode ADL Comments: Pt requires cues for intiation and sequencing.  Pt with occasional Lt inattention, and is able to report frustrations with LUE functioning.  Pt with h/o COPD and asthma, requires multiple rest breaks.  See FIM for current functional status  Therapy/Group: Individual Therapy  [name] 08/31/2011, 1:56 PM

## 2011-08-31 NOTE — Progress Notes (Signed)
Hypoglycemic Event  CBG: 59  Treatment: 15 GM carbohydrate snack  Symptoms: None  Follow-up CBG: Time:1650 CBG Result:104  Possible Reasons for Event: Unknown  Comments/MD notified Cindy Love PA, pt ate 100% dinner after event    [name], Cindy Abbott  Remember to initiate Hypoglycemia Order Set & complete

## 2011-09-01 ENCOUNTER — Inpatient Hospital Stay (HOSPITAL_COMMUNITY): Payer: Medicare Other | Admitting: Physical Therapy

## 2011-09-01 ENCOUNTER — Inpatient Hospital Stay (HOSPITAL_COMMUNITY): Payer: Medicare Other | Admitting: Speech Pathology

## 2011-09-01 ENCOUNTER — Inpatient Hospital Stay (HOSPITAL_COMMUNITY): Payer: Medicare Other | Admitting: Occupational Therapy

## 2011-09-01 LAB — GLUCOSE, CAPILLARY
Glucose-Capillary: 207 mg/dL — ABNORMAL HIGH (ref 70–99)
Glucose-Capillary: 144 mg/dL — ABNORMAL HIGH (ref 70–99)

## 2011-09-01 MED ORDER — METOPROLOL TARTRATE 25 MG PO TABS
25.0000 mg | ORAL_TABLET | Freq: Two times a day (BID) | ORAL | Status: DC
  Administered 2011-09-02 – 2011-09-14 (×23): 25 mg via ORAL
  Filled 2011-09-01 (×28): qty 1

## 2011-09-01 MED ORDER — METOPROLOL TARTRATE 25 MG PO TABS
25.0000 mg | ORAL_TABLET | Freq: Two times a day (BID) | ORAL | Status: DC
  Filled 2011-09-01 (×2): qty 1

## 2011-09-01 NOTE — Progress Notes (Signed)
Occupational Therapy Session Note  Patient Details  Name: Cindy Abbott MRN: [id number] Date of Birth: February 17, 1922  Today's Date: 09/01/2011 Time: 1200-1230 Time Calculation (min): 30 min  Pt seen for group session, [employer], with SLP with focus on Lt attention and increased use of LUE with self-feeding tasks. Pt with improved carryover of previous sessions with increased attention to and use of LUE to hold containers as needed and scan to Lt environment to obtain items. Pt with min cues to put down utensils in order to pick up cup or open container. Pt with some ?demonstration of perseveration with self-feeding and use of utensils, to be further assessed.  Pt with increased PO intake this session.   Pain: Pain Assessment Pain Assessment: No/denies pain  Therapy/Group: Group Therapy  [name] 09/01/2011, 1:18 PM

## 2011-09-01 NOTE — Progress Notes (Signed)
Nutrition Follow-up  Intervention:  Continue current interventions, pt is progressing well.  Assessment:   Intake has improved, pt eating 75 - 100% of meals and taking supplements as scheduled, per RN. Pt denies any questions/concerns at this time.  Diet Order:  Dysphagia 2 Supplement: Ensure Pudding TID, Glucerna Shake daily  Meds: Scheduled Meds:   . ipratropium  0.5 mg Nebulization BID   And  . albuterol  2.5 mg Nebulization BID  . antiseptic oral rinse  15 mL Mouth Rinse QID  . aspirin  325 mg Oral Daily  . cloNIDine  0.2 mg Transdermal Weekly  . clotrimazole  1 Applicatorful Vaginal QHS  . feeding supplement  1 Container Oral TID BM  . feeding supplement  237 mL Oral Q24H  . hydrocerin   Topical BID  . insulin aspart  10 Units Subcutaneous BID AC  . insulin aspart  5 Units Subcutaneous QAC breakfast  . insulin glargine  10 Units Subcutaneous QHS  . isosorbide mononitrate  30 mg Oral Daily  . latanoprost  1 drop Both Eyes QHS  . metoprolol  25 mg Oral BID  . pantoprazole  40 mg Oral Q breakfast   Continuous Infusions:  PRN Meds:.acetaminophen, lidocaine, ondansetron (ZOFRAN) IV, ondansetron, polyethylene glycol, sorbitol  Labs:  CMP     Component Value Date/Time   NA 135 08/27/2011 0945   K 4.8 08/27/2011 0945   CL 102 08/27/2011 0945   CO2 23 08/27/2011 0945   GLUCOSE 140* 08/27/2011 0945   BUN 12 08/27/2011 0945   CREATININE 1.28* 08/27/2011 0945   CALCIUM 9.0 08/27/2011 0945   PROT 6.1 08/24/2011 0650   ALBUMIN 2.2* 08/24/2011 0650   AST 19 08/24/2011 0650   ALT 17 08/24/2011 0650   ALKPHOS 99 08/24/2011 0650   BILITOT 0.6 08/24/2011 0650   GFRNONAA 37* 08/27/2011 0945   GFRAA 43* 08/27/2011 0945   CBG (last 3)   Basename 09/01/11 0714 08/31/11 2113 08/31/11 1932  GLUCAP 151* 233* 191*      Intake/Output Summary (Last 24 hours) at 09/01/11 1052 Last data filed at 09/01/11 0800  Gross per 24 hour  Intake    960 ml  Output      0 ml  Net    960 ml  BM  8/26  Weight Status:  145 lb - wt down 7 lb  Estimated needs:  1450  -1600 kcal, 65 - 75 grams protein  Nutrition Dx:  Inadequate oral intake r/t variable appetite and mental status AEB variable intake - resolved.  Goal: Pt to meet >/= 90% of their estimated nutrition needs; met  Monitor:  Weights, labs, PO intake, supplement tolerance  Jarold Motto MS, RD, LDN Pager: [phone] After-hours pager: [phone]

## 2011-09-01 NOTE — Progress Notes (Signed)
Occupational Therapy Session Note  Patient Details  Name: Johnnette Laux MRN: [id number] Date of Birth: 1922-11-22  Today's Date: 09/01/2011 Time: 0810-0900 Time Calculation (min): 50 min  Short Term Goals: Week 2:  OT Short Term Goal 1 (Week 2): Pt will complete  UB dressing with setup and min cues for sequencing. OT Short Term Goal 2 (Week 2): Pt will complete LB dressing with min/steadying assist. OT Short Term Goal 3 (Week 2): Pt will complete toilet transfer with min assist with LRAD. OT Short Term Goal 4 (Week 2): Pt will complete grooming with setup assist with LUE at diminished level.  Skilled Therapeutic Interventions:  Patient eating breakfast upon arrival.  Self care retraining to include self feeding, bath, dress and groom tasks.  Focus session on standing tolerance and standing balance, attention to and forced use of LUE, sit><stands, adaptive techniques for LB bath and dress.  Patient put on hospital gown today secondary to no clean clothes.  Staff hope to wash the outfit in her room today.  Patient required numerous rest breaks secondary to stand to wash all body parts except below her knees.    Therapy Documentation Precautions:  Precautions Precautions: Fall Precaution Comments: Dys 1, Nectar thick liquids, TEDs donned OOB Restrictions Weight Bearing Restrictions: No Pain: No c/o pain See FIM for current functional status  Therapy/Group: Individual Therapy  Arie Gable 09/01/2011, 9:12 AM

## 2011-09-01 NOTE — Progress Notes (Signed)
Occupational Therapy Session Note  Patient Details  Name: Cindy Abbott MRN: [id number] Date of Birth: 01/20/1922  Today's Date: 09/01/2011 Time: 4098-1191 Time Calculation (min): 42 min  Short Term Goals: Week 2:  OT Short Term Goal 1 (Week 2): Pt will complete  UB dressing with setup and min cues for sequencing. OT Short Term Goal 2 (Week 2): Pt will complete LB dressing with min/steadying assist. OT Short Term Goal 3 (Week 2): Pt will complete toilet transfer with min assist with LRAD. OT Short Term Goal 4 (Week 2): Pt will complete grooming with setup assist with LUE at diminished level.  Skilled Therapeutic Interventions/Progress Updates:    Pt sitting at sink brushing teeth upon arrival.  Pt required cues to continue with brushing and increased time.  Engaged in completing task in standing to spit and rinse mouth with mod assist (lifting) to stand and min assist maintaining standing balance.  Verbal cues to incorporate Lt hand in holding cup and holding toothpaste while screwing on cap.  Completed laundry task in standing with cues to recall routine for laundry task at home.  Focus on sit to stand and standing balance with laundry and other therapeutic activities.  Pt continues to require multiple rest breaks due to decreased endurance.  Therapy Documentation Precautions:  Precautions Precautions: Fall Precaution Comments: Dys 1, Nectar thick liquids, TEDs donned OOB Restrictions Weight Bearing Restrictions: No General:   Vital Signs: Therapy Vitals Temp: 98.2 F (36.8 C) Temp src: Oral Pulse Rate: 60  Resp: 18  BP: 147/67 mmHg Patient Position, if appropriate: Sitting Oxygen Therapy SpO2: 100 % O2 Device: None (Room air) Pulse Oximetry Type: Intermittent Pain:   Pt with no c/o pain this session.  See FIM for current functional status  Therapy/Group: Individual Therapy  [name] 09/01/2011, 10:39 AM

## 2011-09-01 NOTE — Progress Notes (Signed)
Physical Therapy Session Note  Patient Details  Name: Cindy Abbott MRN: [id number] Date of Birth: 12-02-1922  Today's Date: 09/01/2011 Time: 4098-1191 Time Calculation (min): 37 min  Short Term Goals: Week 1:  PT Short Term Goal 1 (Week 1): Patient will perform bed mobility flat bed to L and R with mod A PT Short Term Goal 1 - Progress (Week 1): Met PT Short Term Goal 2 (Week 1): Patient will perform bed <> w/c transfers to L and R with mod A PT Short Term Goal 2 - Progress (Week 1): Partly met PT Short Term Goal 3 (Week 1): Patient will tolerate dynamic and static sitting balance and trunk control training without back support and mod A x 5-10 minutes PT Short Term Goal 3 - Progress (Week 1): Met PT Short Term Goal 4 (Week 1): Patient will perform standing balance and pre gait training with max A x 5 minutes PT Short Term Goal 4 - Progress (Week 1): Partly met PT Short Term Goal 5 (Week 1): Patient will perform gait x 25' with max A and LRAD in controlled environment PT Short Term Goal 5 - Progress (Week 1): Partly met Week 2:  PT Short Term Goal 1 (Week 2): Patient will perform gait x 25' with max A and LRAD in controlled environment  PT Short Term Goal 2 (Week 2): Patient will perform standing balance and pre gait training with max A x 5 minutes  PT Short Term Goal 3 (Week 2): Patient will perform bed <> w/c transfers to L and R with mod A PT Short Term Goal 4 (Week 2): Patient will perform bed mobility on flat bed to L and R with min A  Skilled Therapeutic Interventions/Progress Updates:   Patient appears very lethargic today; reports not sleeping well last night.  Attempted to discuss transportation options with patient but unable to report to therapist.  Performed w/c <> simulated car transfer stand pivot with bilat UE support and max A for lifting, lowering and total verbal cues for pivoting sequence to L; patient required min A to lift LLE into simulated car but able to bring LLE out of  car with supervision.  Patient's clothes needing to be changed from washer > dryer; patient stood at washer with LUE support reaching into washer with RUE and then pushing through LLE with tactile cues to squat and reach down and into dryer to R x 3-4 reps with mod A overall.  Patient noted to have had incontinent BM; returned to room and performed w/c > toilet with grab bars for stand pivot and mod A and static standing to assist with brief doffing; patient continued BM and urination on toilet and to transfer back to w/c with nursing.  Therapy Documentation Precautions:  Precautions Precautions: Fall Precaution Comments: Dys 1, Nectar thick liquids, TEDs donned OOB Restrictions Weight Bearing Restrictions: No Vital Signs: Therapy Vitals Temp: 98.2 F (36.8 C) Temp src: Oral Pulse Rate: 60  Resp: 18  BP: 147/67 mmHg Patient Position, if appropriate: Sitting Oxygen Therapy SpO2: 100 % O2 Device: None (Room air) Pulse Oximetry Type: Intermittent Pain: Pain Assessment Pain Assessment: No/denies pain  See FIM for current functional status  Therapy/Group: Individual Therapy  [facility] 09/01/2011, 11:14 AM

## 2011-09-01 NOTE — Progress Notes (Signed)
Patient ID: Cindy Abbott, female   DOB: 1922-06-03, 78 y.o.   MRN: [id number]  Subjective/Complaints: No dysuria.  Eating ok with supervision.  Cont of B and B per nursing. Team conference Review of Systems  Genitourinary:       Retention  All other systems reviewed and are negative.     Objective: Vital Signs: Blood pressure 147/67, pulse 60, temperature 98.2 F (36.8 C), temperature source Oral, resp. rate 18, height 5\' 5"  (1.651 m), weight 66.2 kg (145 lb 15.1 oz), SpO2 100.00%. No results found. Results for orders placed during the hospital encounter of 08/23/11 (from the past 72 hour(s))  GLUCOSE, CAPILLARY     Status: Abnormal   Collection Time   08/29/11 11:16 AM      Component Value Range Comment   Glucose-Capillary 136 (*) 70 - 99 mg/dL    Comment 1 Notify RN     GLUCOSE, CAPILLARY     Status: Abnormal   Collection Time   08/29/11  4:01 PM      Component Value Range Comment   Glucose-Capillary 137 (*) 70 - 99 mg/dL    Comment 1 Notify RN     GLUCOSE, CAPILLARY     Status: Abnormal   Collection Time   08/29/11  8:45 PM      Component Value Range Comment   Glucose-Capillary 142 (*) 70 - 99 mg/dL    Comment 1 Notify RN     GLUCOSE, CAPILLARY     Status: Abnormal   Collection Time   08/30/11  7:32 AM      Component Value Range Comment   Glucose-Capillary 142 (*) 70 - 99 mg/dL   GLUCOSE, CAPILLARY     Status: Abnormal   Collection Time   08/30/11 11:23 AM      Component Value Range Comment   Glucose-Capillary 127 (*) 70 - 99 mg/dL    Comment 1 Notify RN     GLUCOSE, CAPILLARY     Status: Abnormal   Collection Time   08/30/11  4:09 PM      Component Value Range Comment   Glucose-Capillary 127 (*) 70 - 99 mg/dL   GLUCOSE, CAPILLARY     Status: Abnormal   Collection Time   08/30/11  8:49 PM      Component Value Range Comment   Glucose-Capillary 159 (*) 70 - 99 mg/dL   GLUCOSE, CAPILLARY     Status: Abnormal   Collection Time   08/31/11  7:12 AM      Component Value  Range Comment   Glucose-Capillary 136 (*) 70 - 99 mg/dL    Comment 1 Notify RN     GLUCOSE, CAPILLARY     Status: Abnormal   Collection Time   08/31/11 11:41 AM      Component Value Range Comment   Glucose-Capillary 215 (*) 70 - 99 mg/dL    Comment 1 Notify RN     GLUCOSE, CAPILLARY     Status: Abnormal   Collection Time   08/31/11  4:30 PM      Component Value Range Comment   Glucose-Capillary 59 (*) 70 - 99 mg/dL   GLUCOSE, CAPILLARY     Status: Normal   Collection Time   08/31/11  4:49 PM      Component Value Range Comment   Glucose-Capillary 72  70 - 99 mg/dL   GLUCOSE, CAPILLARY     Status: Abnormal   Collection Time   08/31/11  5:23 PM  Component Value Range Comment   Glucose-Capillary 104 (*) 70 - 99 mg/dL   GLUCOSE, CAPILLARY     Status: Abnormal   Collection Time   08/31/11  7:32 PM      Component Value Range Comment   Glucose-Capillary 191 (*) 70 - 99 mg/dL   GLUCOSE, CAPILLARY     Status: Abnormal   Collection Time   08/31/11  9:13 PM      Component Value Range Comment   Glucose-Capillary 233 (*) 70 - 99 mg/dL   GLUCOSE, CAPILLARY     Status: Abnormal   Collection Time   09/01/11  7:14 AM      Component Value Range Comment   Glucose-Capillary 151 (*) 70 - 99 mg/dL    Comment 1 Notify RN        HEENT: normal Cardio: RRR Resp: CTA B/L GI: BS positive Extremity:  No Edema Skin:   Intact Neuro: Confused, Flat, Normal Sensory and Abnormal Motor 4/5 on Left side UE and LE Musc/Skel:  Normal Skin is quite dry  Assessment/Plan: 1. Functional deficits secondary to R ICA infarct with L HP, Mild L neglect which require 3+ hours per day of interdisciplinary therapy in a comprehensive inpatient rehab setting. Physiatrist is providing close team supervision and 24 hour management of active medical problems listed below. Physiatrist and rehab team continue to assess barriers to discharge/monitor patient progress toward functional and medical goals. FIM: FIM -  Bathing Bathing Steps Patient Completed: Chest;Right Arm;Left Arm;Abdomen;Front perineal area;Buttocks;Right upper leg;Left upper leg Bathing: 4: Min-Patient completes 8-9 67f 10 parts or 75+ percent  FIM - Upper Body Dressing/Undressing Upper body dressing/undressing steps patient completed: Thread/unthread right sleeve of pullover shirt/dresss;Thread/unthread left sleeve of pullover shirt/dress;Put head through opening of pull over shirt/dress Upper body dressing/undressing: 4: Min-Patient completed 75 plus % of tasks FIM - Lower Body Dressing/Undressing Lower body dressing/undressing steps patient completed: Thread/unthread right underwear leg;Thread/unthread left underwear leg;Thread/unthread right pants leg;Thread/unthread left pants leg;Pull pants up/down;Don/Doff left sock;Don/Doff right sock Lower body dressing/undressing: 4: Min-Patient completed 75 plus % of tasks  FIM - Toileting Toileting steps completed by patient: Performs perineal hygiene Toileting Assistive Devices: Grab bar or rail for support Toileting: 2: Max-Patient completed 1 of 3 steps  FIM - Diplomatic Services operational officer Devices: Psychiatrist Transfers: 2-To toilet/BSC: Max A (lift and lower assist);2-From toilet/BSC: Max A (lift and lower assist)  FIM - Press photographer Assistive Devices: Bed rails;Arm rests Bed/Chair Transfer: 3: Supine > Sit: Mod A (lifting assist/Pt. 50-74%/lift 2 legs;2: Bed > Chair or W/C: Max A (lift and lower assist);2: Chair or W/C > Bed: Max A (lift and lower assist)  FIM - Locomotion: Wheelchair Distance: 150 Locomotion: Wheelchair: 1: Total Assistance/staff pushes wheelchair (Pt<25%) FIM - Locomotion: Ambulation Locomotion: Ambulation Assistive Devices: Designer, industrial/product Ambulation/Gait Assistance: 1: +1 Total assist Locomotion: Ambulation: 0: Activity did not occur  Comprehension Comprehension Mode: Auditory Comprehension: 4-Understands  basic 75 - 89% of the time/requires cueing 10 - 24% of the time  Expression Expression Mode: Verbal Expression: 4-Expresses basic 75 - 89% of the time/requires cueing 10 - 24% of the time. Needs helper to occlude trach/needs to repeat words.  Social Interaction Social Interaction: 5-Interacts appropriately 90% of the time - Needs monitoring or encouragement for participation or interaction.  Problem Solving Problem Solving: 2-Solves basic 25 - 49% of the time - needs direction more than half the time to initiate, plan or complete simple activities  Memory  Memory: 2-Recognizes or recalls 25 - 49% of the time/requires cueing 51 - 75% of the time  Medical Problem List and Plan:  1. 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 DVT Prophylaxis/Anticoagulation: SCDs. Monitor for signs of DVT. Will consider Lovenox therapy  3. Dysphagia. Dysphagia 1 diet with thin liquids as per modified barium swallow 08/23/2011. Monitor for any signs of aspiration. Followup speech therapy. . Monitor hydration  . Latest creatinine 1.18 4. Neuropsych: This patient is not capable of making decisions on her own behalf.  5. Diabetes mellitus. Controlled Hemoglobin A1c of 8.2. Lantus insulin 10 units each bedtime as well as NovoLog 10 units before lunch and supper and 5 units before breakfast . Hold parameters added to 10 unit dose to help avoid hypoglycemia. Generally her am cbg's have been better. Follow for now 6. Hypertension. Clonidine patch 0.2 mg weekly,.imdur 30 mg daily, Lopressor 25 mg twice a day. Monitor the increased mobility . Patient denies any dizziness when up for therapies. No chest pain or shortness of breath 7. Escherichia coli UTI. D/C Bactrim  Which was initiated 08/23/2011. No dysuria noted,monitor inc 8. CAD status post pacemaker. Patient denies any chest pain. Continue aspirin therapy   LOS (Days) 9 A FACE TO FACE EVALUATION WAS PERFORMED  Graylee Arutyunyan  E 09/01/2011, 8:14 AM

## 2011-09-01 NOTE — Progress Notes (Signed)
NT called RN to patient's room, Patient blood pressure with dynamap 89/33 pulse rate  61 , patient denies any dizziness, pain. Recheck with dynamap 88/38 pulse rate 61. Manual Blood pressure by this writer 100/40 heart rate 63. Reported to Dr. Riley Kill, patient denies any discomfort, dizziness.Made MD aware of oral medications and catapress patch in use, Instructed to hold tonight's dose of Lopressor and not to give am dose of Blood pressure medications until Patient seen in rounds by Dr. Wynn Banker in am. [place], Jabier Deese Elon Jester

## 2011-09-01 NOTE — Progress Notes (Signed)
Speech Language Pathology Daily Session Note  Patient Details  Name: Kadince Boxley MRN: [id number] Date of Birth: April 04, 1922  Today's Date: 09/01/2011 Time: 1130-1200 Time Calculation (min): 30 min  Short Term Goals: Week 2: SLP Short Term Goal 1 (Week 2): Patient will consume Dys.2 textures and thin liquids with mod assist to utilize compensatory strategies to prevent overt s/s of aspiraiton  SLP Short Term Goal 2 (Week 2): Patient will consume trials of Dys.3 textures with mod assist verbal cues for efficient mastication and to monitor oral residue. SLP Short Term Goal 3 (Week 2): Patient will attend to left upper extremity with mod assist semantic cues during functional activities.  SLP Short Term Goal 4 (Week 2): Patient will attend to left of environment with mod assist semantic cues during functional tasks. SLP Short Term Goal 5 (Week 2): Patient will utilize external aids for orientation with mod assist semantic cues.  Skilled Therapeutic Interventions: Co-treatment with OT; SLP facilitated session with supervision level cues to perform throat clears, which patient carried over with more independence today. SLP also provided min assist level verbal cues to utilize thin liquid sips to assist with reducing oral residue with Dys.2 textures and upper dentures in place. Patient demonstrated oral manipulation towards the front of her mouth today and oral holding was also observed due to some decreased attention to bolus.  As a result, patient demonstrated coughing episode x1 SLP provided verbal cues to keep coughing and to cough hard to reduce suspected penetrates from airway; patient orally expectorated thick mucus. SLP increased cues to mod assist level to remove oral reside prior to another bite or sips which prevented any further s/s of aspiration throughout meal.      FIM:  Comprehension Comprehension Mode: Auditory Comprehension: 4-Understands basic 75 - 89% of the time/requires cueing 10 -  24% of the time Expression Expression Mode: Verbal Expression: 4-Expresses basic 75 - 89% of the time/requires cueing 10 - 24% of the time. Needs helper to occlude trach/needs to repeat words. Social Interaction Social Interaction: 5-Interacts appropriately 90% of the time - Needs monitoring or encouragement for participation or interaction. Problem Solving Problem Solving: 2-Solves basic 25 - 49% of the time - needs direction more than half the time to initiate, plan or complete simple activities Memory Memory: 2-Recognizes or recalls 25 - 49% of the time/requires cueing 51 - 75% of the time FIM - Eating Eating Activity: 5: Set-up assist for open containers;5: Supervision/cues  Pain Pain Assessment Pain Assessment: No/denies pain  Therapy/Group: Group Therapy  Charlane Ferretti., CCC-SLP [phone]  Jesus Nevills 09/01/2011, 1:13 PM

## 2011-09-02 ENCOUNTER — Inpatient Hospital Stay (HOSPITAL_COMMUNITY): Payer: Medicare Other | Admitting: Occupational Therapy

## 2011-09-02 ENCOUNTER — Inpatient Hospital Stay (HOSPITAL_COMMUNITY): Payer: Medicare Other | Admitting: Physical Therapy

## 2011-09-02 ENCOUNTER — Inpatient Hospital Stay (HOSPITAL_COMMUNITY): Payer: Medicare Other | Admitting: Speech Pathology

## 2011-09-02 LAB — GLUCOSE, CAPILLARY
Glucose-Capillary: 161 mg/dL — ABNORMAL HIGH (ref 70–99)
Glucose-Capillary: 203 mg/dL — ABNORMAL HIGH (ref 70–99)
Glucose-Capillary: 165 mg/dL — ABNORMAL HIGH (ref 70–99)

## 2011-09-02 NOTE — Progress Notes (Signed)
Speech Language Pathology Daily Session Note  Patient Details  Name: Cindy Abbott MRN: [id number] Date of Birth: 15-Dec-1922  Today's Date: 09/02/2011 Time: 1200-1230 Time Calculation (min): 30 min  Short Term Goals: Week 2: SLP Short Term Goal 1 (Week 2): Patient will consume Dys.2 textures and thin liquids with mod assist to utilize compensatory strategies to prevent overt s/s of aspiraiton  SLP Short Term Goal 2 (Week 2): Patient will consume trials of Dys.3 textures with mod assist verbal cues for efficient mastication and to monitor oral residue. SLP Short Term Goal 3 (Week 2): Patient will attend to left upper extremity with mod assist semantic cues during functional activities.  SLP Short Term Goal 4 (Week 2): Patient will attend to left of environment with mod assist semantic cues during functional tasks. SLP Short Term Goal 5 (Week 2): Patient will utilize external aids for orientation with mod assist semantic cues.  Skilled Therapeutic Interventions: Co-treatment with OT; SLP facilitated session with supervision level cues to perform throat clears, which patient carried over with more independence today. SLP also provided min assist level verbal cues to utilize thin liquid sips to assist with reducing oral residue with Dys.2 textures and upper dentures in place. Patient demonstrated no overt s/s of aspiration with p.o. today.   FIM:  Comprehension Comprehension Mode: Auditory Comprehension: 4-Understands basic 75 - 89% of the time/requires cueing 10 - 24% of the time Expression Expression Mode: Verbal Expression: 4-Expresses basic 75 - 89% of the time/requires cueing 10 - 24% of the time. Needs helper to occlude trach/needs to repeat words. Social Interaction Social Interaction: 5-Interacts appropriately 90% of the time - Needs monitoring or encouragement for participation or interaction. Problem Solving Problem Solving: 2-Solves basic 25 - 49% of the time - needs direction more than  half the time to initiate, plan or complete simple activities Memory Memory: 2-Recognizes or recalls 25 - 49% of the time/requires cueing 51 - 75% of the time FIM - Eating Eating Activity: 5: Set-up assist for open containers;5: Supervision/cues  Pain Pain Assessment Pain Assessment: No/denies pain  Therapy/Group: Group Therapy  Charlane Ferretti., CCC-SLP [phone]  Gadiel John 09/02/2011, 12:58 PM

## 2011-09-02 NOTE — Patient Care Conference (Signed)
Inpatient RehabilitationTeam Conference Note Date: 09/01/2011   Time: 11:25 AM   Patient Name: Cindy Abbott      Medical Record Number: [id number]  Date of Birth: 1922/03/19 Sex: Female         Room/Bed: 4147/4147-01 Payor Info: Payor: MEDICARE  Plan: MEDICARE PART A AND B  Product Type: *No Product type*     Admitting Diagnosis: RT CVA  Admit Date/Time:  08/23/2011  4:19 PM Admission Comments: No comment available   Primary Diagnosis:  CVA (cerebral infarction) Principal Problem: CVA (cerebral infarction)  Patient Active Problem List   Diagnosis Date Noted  . CVA (cerebral infarction) 08/24/2011  . UTI (urinary tract infection) 08/18/2011  . Confusion 08/18/2011  . Generalized weakness 08/18/2011  . Renal insufficiency, mild 08/18/2011  . Anemia 08/18/2011  . Hypokalemia 08/18/2011  . Diabetes mellitus 08/18/2011  . S/P CABG x 3 08/18/2011  . CHF, chronic 08/18/2011  . S/P placement of cardiac pacemaker 08/18/2011  . S/P colonoscopy with polypectomy 08/18/2011  . H/O angioedema 08/18/2011  . Coronary artery disease   . Asthma     Expected Discharge Date: Expected Discharge Date: 09/14/11  Team Members Present: Physician: Dr. Claudette Laws Social Worker Present: Dossie Der, LCSW Nurse Present: Gregor Hams, RN PT Present: Edman Circle, Judith Blonder, PTA OT Present: [name], OT SLP Present: Fae Pippin, SLP Other (Discipline and Name): Charolette Child Coordinator     Current Status/Progress Goal Weekly Team Focus  Medical   activity tolerance improving, appetite improving  upgrade diet as per speech therapy  may be able to advance 3R day therapy   Bowel/Bladder   Continent of bowel and bladder with eposides of incontinents  Continent of bowel and bladder  Timed tolieting   Swallow/Nutrition/ Hydration   Dys.2 textures and thin liquids with full supervision   least restrictive p.o. intake  Dys.3 trials   ADL's   mod assist bathing and dressing,  mod-max assist transfers, mod assist toileting with increased time  min assist overall, supervision UB dsg and grooming  dynamic sitting and standing balance, sit <> stand, activity tolerance, postural control   Mobility   mod-max overall with very poor activity tolerance  min A overall w/c level, home ambulation goal D/C   postural control, transfers, standing and activity tolerance   Communication             Safety/Cognition/ Behavioral Observations  mod-max assist   min assist   increase left attention, carryover, family education    Pain   No complaints of pain  Pain < or = 3  Monitor   Skin   Dry skin  No skin breakdown  Monitor skin for breakdown, apply eucerin cream as needed      *See Interdisciplinary Assessment and Plan and progress notes for long and short-term goals  Barriers to Discharge: will need physical assistance upon discharge    Possible Resolutions to Barriers:  training family    Discharge Planning/Teaching Needs:  Home with son and family to provide 24hour care-son has been here and participated       Team Discussion:  Endurance imporving , doesn't use call light timed tolieting by RN.  Working on B & B program, off O2, advancing diet with Speech Therapist-begin family education  Revisions to Treatment Plan:  None   Continued Need for Acute Rehabilitation Level of Care: The patient requires daily medical management by a physician with specialized training in physical medicine and rehabilitation for the following conditions: Daily  direction of a multidisciplinary physical rehabilitation program to ensure safe treatment while eliciting the highest outcome that is of practical value to the patient.: Yes Daily medical management of patient stability for increased activity during participation in an intensive rehabilitation regime.: Yes Daily analysis of laboratory values and/or radiology reports with any subsequent need for medication adjustment of medical  intervention for : Neurological problems  Morenike Cuff, Lemar Livings 09/02/2011, 8:55 AM

## 2011-09-02 NOTE — Progress Notes (Signed)
Per verbal instructions from Dr. Riley Kill on 09-01-11 discussed with [name], PA patient's blood pressure this am at 0930 and obtained verbal ok to administer scheduled blood pressure medication this am. Roberts-VonCannon, Ariane Ditullio Elon Jester

## 2011-09-02 NOTE — Progress Notes (Signed)
Social Work Patient ID: Cindy Abbott, female   DOB: 10/14/1922, 78 y.o.   MRN: [id number] Spoke with son via telephone and met with pt to inform team conference progression toward goals and discharge.  Both pleased with how well pt is progressing. Son is somewhat concerned about pt's weight gain, will address with PA.  Son reports she is eating all of her meals and drinking ensure, questions if ensure Needs to be discharged.  He is concerned due to pt's heart issues.  He plans to come to go through therapies next week-to begin family education.  Continue to work on  a discharge plan-son plans to take home even though she will require physical care.   He is very committed to pt and her care.

## 2011-09-02 NOTE — Progress Notes (Signed)
Occupational Therapy Session Note  Patient Details  Name: Cindy Abbott MRN: [id number] Date of Birth: Dec 21, 1922  Today's Date: 09/02/2011 Time: 1130-1200 Time Calculation (min): 30 min  Pt seen for group session, [employer], with SLP with focus on Lt attention and increased use of LUE with self-feeding tasks. Pt with decreased carryover of previous sessions with decreased attention to and use of LUE to hold containers.  Pt requiring increased cue to scan to Lt environment to obtain items and to utilize LUE. Pt with min cues to put down utensils in order to pick up cup or open container. Pt with increased PO intake this session.   Pt reports pain in Lt lower back, unrated.  RN notified  Therapy/Group: Group Therapy  [name] 09/02/2011, 12:25 PM

## 2011-09-02 NOTE — Progress Notes (Signed)
Patient ID: Cindy Abbott, female   DOB: 1922-10-09, 78 y.o.   MRN: [id number]  Subjective/Complaints: No dysuria.  Eating ok with supervision.  Cont of B and B per nursing. Very complimentary regarding rehab staff Review of Systems  Genitourinary:       Retention  All other systems reviewed and are negative.     Objective: Vital Signs: Blood pressure 102/61, pulse 60, temperature 97.8 F (36.6 C), temperature source Oral, resp. rate 22, height 5\' 5"  (1.651 m), weight 70.2 kg (154 lb 12.2 oz), SpO2 87.00%. No results found. Results for orders placed during the hospital encounter of 08/23/11 (from the past 72 hour(s))  GLUCOSE, CAPILLARY     Status: Abnormal   Collection Time   08/30/11  8:49 PM      Component Value Range Comment   Glucose-Capillary 159 (*) 70 - 99 mg/dL   GLUCOSE, CAPILLARY     Status: Abnormal   Collection Time   08/31/11  7:12 AM      Component Value Range Comment   Glucose-Capillary 136 (*) 70 - 99 mg/dL    Comment 1 Notify RN     GLUCOSE, CAPILLARY     Status: Abnormal   Collection Time   08/31/11 11:41 AM      Component Value Range Comment   Glucose-Capillary 215 (*) 70 - 99 mg/dL    Comment 1 Notify RN     GLUCOSE, CAPILLARY     Status: Abnormal   Collection Time   08/31/11  4:30 PM      Component Value Range Comment   Glucose-Capillary 59 (*) 70 - 99 mg/dL   GLUCOSE, CAPILLARY     Status: Normal   Collection Time   08/31/11  4:49 PM      Component Value Range Comment   Glucose-Capillary 72  70 - 99 mg/dL   GLUCOSE, CAPILLARY     Status: Abnormal   Collection Time   08/31/11  5:23 PM      Component Value Range Comment   Glucose-Capillary 104 (*) 70 - 99 mg/dL   GLUCOSE, CAPILLARY     Status: Abnormal   Collection Time   08/31/11  7:32 PM      Component Value Range Comment   Glucose-Capillary 191 (*) 70 - 99 mg/dL   GLUCOSE, CAPILLARY     Status: Abnormal   Collection Time   08/31/11  9:13 PM      Component Value Range Comment   Glucose-Capillary 233  (*) 70 - 99 mg/dL   GLUCOSE, CAPILLARY     Status: Abnormal   Collection Time   09/01/11  7:14 AM      Component Value Range Comment   Glucose-Capillary 151 (*) 70 - 99 mg/dL    Comment 1 Notify RN     GLUCOSE, CAPILLARY     Status: Abnormal   Collection Time   09/01/11 11:19 AM      Component Value Range Comment   Glucose-Capillary 207 (*) 70 - 99 mg/dL    Comment 1 Notify RN     GLUCOSE, CAPILLARY     Status: Abnormal   Collection Time   09/01/11  5:08 PM      Component Value Range Comment   Glucose-Capillary 101 (*) 70 - 99 mg/dL   GLUCOSE, CAPILLARY     Status: Abnormal   Collection Time   09/01/11  9:19 PM      Component Value Range Comment   Glucose-Capillary 144 (*) 70 -  99 mg/dL   GLUCOSE, CAPILLARY     Status: Abnormal   Collection Time   09/02/11  7:07 AM      Component Value Range Comment   Glucose-Capillary 161 (*) 70 - 99 mg/dL    Comment 1 Notify RN     GLUCOSE, CAPILLARY     Status: Abnormal   Collection Time   09/02/11 11:25 AM      Component Value Range Comment   Glucose-Capillary 203 (*) 70 - 99 mg/dL   GLUCOSE, CAPILLARY     Status: Abnormal   Collection Time   09/02/11  4:21 PM      Component Value Range Comment   Glucose-Capillary 165 (*) 70 - 99 mg/dL    Comment 1 Notify RN        HEENT: normal Cardio: RRR Resp: CTA B/L GI: BS positive Extremity:  No Edema Skin:   Intact Neuro: Confused, Flat, Normal Sensory and Abnormal Motor 4/5 on Left side UE and LE Musc/Skel:  Normal Skin is quite dry  Assessment/Plan: 1. Functional deficits secondary to R ICA infarct with L HP, Mild L neglect which require 3+ hours per day of interdisciplinary therapy in a comprehensive inpatient rehab setting. Physiatrist is providing close team supervision and 24 hour management of active medical problems listed below. Physiatrist and rehab team continue to assess barriers to discharge/monitor patient progress toward functional and medical goals. FIM: FIM -  Bathing Bathing Steps Patient Completed: Chest;Right Arm;Left Arm;Abdomen;Front perineal area;Buttocks;Right upper leg;Left upper leg Bathing: 4: Min-Patient completes 8-9 61f 10 parts or 75+ percent  FIM - Upper Body Dressing/Undressing Upper body dressing/undressing steps patient completed: Thread/unthread right bra strap;Thread/unthread left bra strap;Thread/unthread left sleeve of front closure shirt/dress;Thread/unthread right sleeve of front closure shirt/dress Upper body dressing/undressing: 3: Mod-Patient completed 50-74% of tasks FIM - Lower Body Dressing/Undressing Lower body dressing/undressing steps patient completed: Thread/unthread right underwear leg;Thread/unthread left underwear leg;Pull underwear up/down;Thread/unthread right pants leg;Thread/unthread left pants leg;Pull pants up/down;Don/Doff left sock;Don/Doff right sock Lower body dressing/undressing: 4: Min-Patient completed 75 plus % of tasks  FIM - Toileting Toileting steps completed by patient: Performs perineal hygiene Toileting Assistive Devices: Grab bar or rail for support Toileting: 2: Max-Patient completed 1 of 3 steps  FIM - Diplomatic Services operational officer Devices: Grab bars Toilet Transfers: 3-To toilet/BSC: Mod A (lift or lower assist);3-From toilet/BSC: Mod A (lift or lower assist)  FIM - Bed/Chair Transfer Bed/Chair Transfer Assistive Devices: Bed rails;Arm rests Bed/Chair Transfer: 3: Bed > Chair or W/C: Mod A (lift or lower assist);3: Chair or W/C > Bed: Mod A (lift or lower assist)  FIM - Locomotion: Wheelchair Distance: 150 Locomotion: Wheelchair: 1: Total Assistance/staff pushes wheelchair (Pt<25%) FIM - Locomotion: Ambulation Locomotion: Ambulation Assistive Devices: Other (comment) (+2 HHA) Ambulation/Gait Assistance: 1: +2 Total assist Locomotion: Ambulation: 1: Two helpers  Comprehension Comprehension Mode: Auditory Comprehension: 4-Understands basic 75 - 89% of the  time/requires cueing 10 - 24% of the time  Expression Expression Mode: Verbal Expression: 4-Expresses basic 75 - 89% of the time/requires cueing 10 - 24% of the time. Needs helper to occlude trach/needs to repeat words.  Social Interaction Social Interaction: 5-Interacts appropriately 90% of the time - Needs monitoring or encouragement for participation or interaction.  Problem Solving Problem Solving: 2-Solves basic 25 - 49% of the time - needs direction more than half the time to initiate, plan or complete simple activities  Memory Memory: 2-Recognizes or recalls 25 - 49% of the time/requires cueing 51 -  75% of the time  Medical Problem List and Plan:  1. 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 DVT Prophylaxis/Anticoagulation: SCDs. Monitor for signs of DVT. Will consider Lovenox therapy  3. Dysphagia. Dysphagia 1 diet with thin liquids as per modified barium swallow 08/23/2011. Monitor for any signs of aspiration. Followup speech therapy. . Monitor hydration  . Latest creatinine 1.18 4. Neuropsych: This patient is not capable of making decisions on her own behalf.  5. Diabetes mellitus. Controlled Hemoglobin A1c of 8.2. Lantus insulin 10 units each bedtime as well as NovoLog 10 units before lunch and supper and 5 units before breakfast . Hold parameters added to 10 unit dose to help avoid hypoglycemia. Generally her am cbg's have been better. Follow for now 6. Hypertension. Clonidine patch 0.2 mg weekly,.imdur 30 mg daily, Lopressor 25 mg twice a day. Monitor the increased mobility . Patient denies any dizziness when up for therapies. No chest pain or shortness of breath 7. Escherichia coli UTI. resolved 8. CAD status post pacemaker. Patient denies any chest pain. Continue aspirin therapy   LOS (Days) 10 A FACE TO FACE EVALUATION WAS PERFORMED  Aoki Wedemeyer E 09/02/2011, 6:41 PM

## 2011-09-02 NOTE — Progress Notes (Signed)
Physical Therapy Session Note  Patient Details  Name: Cindy Abbott MRN: [id number] Date of Birth: 08/27/1922  Today's Date: 09/02/2011 Time: 0900-1000 Time Calculation (min): 60 min  Short Term Goals: Week 2:  PT Short Term Goal 1 (Week 2): Patient will perform gait x 25' with max A and LRAD in controlled environment  PT Short Term Goal 2 (Week 2): Patient will perform standing balance and pre gait training with max A x 5 minutes  PT Short Term Goal 3 (Week 2): Patient will perform bed <> w/c transfers to L and R with mod A PT Short Term Goal 4 (Week 2): Patient will perform bed mobility on flat bed to L and R with min A  Skilled Therapeutic Interventions/Progress Updates:   Patient on timed voids secondary to lack of initiation and requesting need to use toilet; Performed toilet transfer w/c <> toilet stand pivot and multiple sit <> stands from toilet with mod A and RUE support on grab bar and cues for LUE placement to push up from w/c arm rest or to remain on grab bar while using RUE for hygiene; required assistance to don underwear.    Postural control, L and R lateral weight shifting, weight acceptance and activation of LLE extensors during sit > squat with LUE reaching up and forwards for trunk elongation for horse shoe and then reaching forward out of BOS over L side to place horse shoe on target and sit > stand and reaching up and to the R with tactile cues for upright posture and extension through LLE x 4 reps  Gait training with +2 A x 24' level surface with RUE HHA and therapist under LUE and facilitation at trunk for upright trunk, LLE full step length, advancement and extension activation in stance; patient very fatigued after ambulation but maintained Sp02 >90% during therapy.  Therapy Documentation Precautions:  Precautions Precautions: Fall Precaution Comments: Dys 1, Nectar thick liquids, TEDs donned OOB Restrictions Weight Bearing Restrictions: No Vital Signs: Therapy  Vitals Temp: 98.6 F (37 C) Temp src: Oral Pulse Rate: 61 Resp: 18  BP: 152/64 mmHg Patient Position, if appropriate: Sitting Oxygen Therapy SpO2: 100 % O2 Device: None (Room air) Pain: Pain Assessment Pain Assessment: No/denies pain  See FIM for current functional status  Therapy/Group: Individual Therapy  Edman Circle [facility] 09/02/2011, 10:26 AM

## 2011-09-02 NOTE — Progress Notes (Signed)
Occupational Therapy Session Note  Patient Details  Name: Cindy Abbott MRN: [id number] Date of Birth: 1922/04/27  Today's Date: 09/02/2011 Time: 1035-1130 and 1340-1404 Time Calculation (min): 55 min and 24 min  Short Term Goals: Week 2:  OT Short Term Goal 1 (Week 2): Pt will complete  UB dressing with setup and min cues for sequencing. OT Short Term Goal 2 (Week 2): Pt will complete LB dressing with min/steadying assist. OT Short Term Goal 3 (Week 2): Pt will complete toilet transfer with min assist with LRAD. OT Short Term Goal 4 (Week 2): Pt will complete grooming with setup assist with LUE at diminished level.  Skilled Therapeutic Interventions/Progress Updates:    1) Pt seen for ADL retraining at sink with focus on sit <> stand, dynamic sitting and standing balance, standing tolerance, and Lt attention.  Decreased attention to LUE this session and increased lean towards Lt in sitting.  Pt demonstrating improvements in sit to stand with initially mod assist (lifting) progressing towards min assist with cues for hand placement and weight shifting.  Pt able to maintain standing balance while pulling up underwear and pants, requiring mod physical assist and verbal cues to remain upright.  Pt completed oral hygiene and was able to put in dentures with only setup assist.  2) 1:1 OT with focus on attention to Lt environment, memory recall, and attention to task.  Engaged in card game with focus on recall of pictures and grouping cards based on similar characteristics.  Pt required frequent cues for recall and attention to task.  Pt with reports of pain in Lt back and requesting to return to bed.  Upon returning to room and placed in bed, pt asking why she was in bed and how long she is supposed to lay there.  Reminded pt about her reports of pain and fatigue and suggesting to attempt this position to alleviate pain.  RN notified of pain.  Therapy Documentation Precautions:  Precautions Precautions:  Fall Precaution Comments: Dys 1, Nectar thick liquids, TEDs donned OOB Restrictions Weight Bearing Restrictions: No General:   Vital Signs: Therapy Vitals Temp: 98.6 F (37 C) Temp src: Oral Pulse Rate: 62  Resp: 18  BP: 152/64 mmHg Patient Position, if appropriate: Sitting Oxygen Therapy SpO2: 100 % O2 Device: None (Room air) Pain: Pain Assessment Pain Assessment: No/denies pain  See FIM for current functional status  Therapy/Group: Individual Therapy  [name] 09/02/2011, 1:22 PM

## 2011-09-03 ENCOUNTER — Inpatient Hospital Stay (HOSPITAL_COMMUNITY): Payer: Medicare Other | Admitting: Physical Therapy

## 2011-09-03 ENCOUNTER — Inpatient Hospital Stay (HOSPITAL_COMMUNITY): Payer: Medicare Other | Admitting: *Deleted

## 2011-09-03 ENCOUNTER — Inpatient Hospital Stay (HOSPITAL_COMMUNITY): Payer: Medicare Other | Admitting: Speech Pathology

## 2011-09-03 ENCOUNTER — Inpatient Hospital Stay (HOSPITAL_COMMUNITY): Payer: Medicare Other | Admitting: Occupational Therapy

## 2011-09-03 LAB — GLUCOSE, CAPILLARY
Glucose-Capillary: 122 mg/dL — ABNORMAL HIGH (ref 70–99)
Glucose-Capillary: 144 mg/dL — ABNORMAL HIGH (ref 70–99)
Glucose-Capillary: 158 mg/dL — ABNORMAL HIGH (ref 70–99)
Glucose-Capillary: 153 mg/dL — ABNORMAL HIGH (ref 70–99)

## 2011-09-03 NOTE — Plan of Care (Signed)
Problem: RH KNOWLEDGE DEFICIT Goal: RH STG INCREASE KNOWLEDGE OF DIABETES Patient and/or Caregiver will be able to verbalize medication used to manage diabetes  Outcome: Progressing Pt's son reports he checks her blood sugars and administers her insulin at home, denies concerns or questions

## 2011-09-03 NOTE — Plan of Care (Signed)
Problem: RH BOWEL ELIMINATION Goal: RH STG MANAGE BOWEL WITH ASSISTANCE STG Manage Bowel with minimal Assistance.  Outcome: Progressing Incontinent today with physical therapy per report of PT Audry

## 2011-09-03 NOTE — Plan of Care (Signed)
Problem: RH KNOWLEDGE DEFICIT Goal: RH STG INCREASE KNOWLEDGE OF DYSPHAGIA/FLUID INTAKE Patient and/or caregiver will verbalize methods to manage dysphagia  Outcome: Not Progressing Patient can not verbalize precautions, son not present for education during this shift

## 2011-09-03 NOTE — Progress Notes (Signed)
Speech Language Pathology Daily Session Note  Patient Details  Name: Cindy Abbott MRN: [id number] Date of Birth: May 30, 1922  Today's Date: 09/03/2011 Time: 1415-1500 Time Calculation (min): 45 min  Short Term Goals: Week 2: SLP Short Term Goal 1 (Week 2): Patient will consume Dys.2 textures and thin liquids with mod assist to utilize compensatory strategies to prevent overt s/s of aspiraiton  SLP Short Term Goal 2 (Week 2): Patient will consume trials of Dys.3 textures with mod assist verbal cues for efficient mastication and to monitor oral residue. SLP Short Term Goal 3 (Week 2): Patient will attend to left upper extremity with mod assist semantic cues during functional activities.  SLP Short Term Goal 4 (Week 2): Patient will attend to left of environment with mod assist semantic cues during functional tasks. SLP Short Term Goal 5 (Week 2): Patient will utilize external aids for orientation with mod assist semantic cues.  Skilled Therapeutic Interventions: Treatment session focused on addressing dysphagia and cognition goals; SLP facilitated session with increased wait time and mod assist cues to self monitor and pace self feeding of Dys.3 textures.  Oral residue increased as session progessd and SLP had to remove cracker residue from roof of mouth with a gloved finger.  As a result, it is recommened that this patient continue her Dys.2 texture diet. [name] exhibited a cough x1 after a large sip of thin liquid via cup.  SLP also facilitated session with supervision cues for orientation.      Pain Pain Assessment Pain Assessment: No/denies pain  Therapy/Group: Individual Therapy  Charlane Ferretti., CCC-SLP [phone]  Jannatul Wojdyla 09/03/2011, 4:56 PM

## 2011-09-03 NOTE — Progress Notes (Signed)
Physical Therapy Session Note  Patient Details  Name: Cindy Abbott MRN: [id number] Date of Birth: 12/23/1922  Today's Date: 09/03/2011 Time: 1300-1358 Time Calculation (min): 58 min  Short Term Goals: Week 2:  PT Short Term Goal 1 (Week 2): Patient will perform gait x 25' with max A and LRAD in controlled environment  PT Short Term Goal 2 (Week 2): Patient will perform standing balance and pre gait training with max A x 5 minutes  PT Short Term Goal 3 (Week 2): Patient will perform bed <> w/c transfers to L and R with mod A PT Short Term Goal 4 (Week 2): Patient will perform bed mobility on flat bed to L and R with min A  Therapy Documentation Precautions:  Precautions Precautions: Fall Precaution Comments: Dys 1, Nectar thick liquids, TEDs donned OOB Restrictions Weight Bearing Restrictions: No   Vital Signs: Therapy Vitals Pulse Rate: 61  Oxygen Therapy SpO2: 97 % O2 Device: None (Room air) Pain: Pain Assessment Pain Assessment: No/denies pain   Locomotion : Ambulation Ambulation: Yes Ambulation/Gait Assistance: 4: Min assist Ambulation Distance (Feet): 20 Feet Patient ambulated a total of 20 feet using HHA for LUE and parallel bar for RUE with min A. Patient ambulated ~5 feet forward while kicking ball to aid in getting full step with LLE, followed by ~5 feet of backward walking to w/c x 2 sets.     Balance: Static Standing Balance Static Standing - Balance Support: Bilateral upper extremity supported Static Standing - Level of Assistance: 4: Min assist Dynamic Standing Balance Dynamic Standing - Balance Support: Bilateral upper extremity supported Dynamic Standing - Level of Assistance: 4: Min assist PT incorporated functional tasks of toileting and handwashing to work on standing balance and endurance. Patient was able to perform hygiene and position clothing appropriately after toileting with RUE while using LUE for support with steadying assit. While standing at sink,  patient performed handwashing using BUEs - used LUE to turn on/off water and RUE to apply soap and dry hands with steadying assist. Patient performed alternating kicking with BLEs with emphasis on weight shifting bilaterally using HHA for LUE and parallel bar for RUE with min assist. Patient able to tolerate 2-3 min x 2 sets. Patient required sitting rest breaks between sets. Patient performed dynamic balance at high/low table while reaching with RUE (LUE supported on table) to assist patient in activating LLE to weight shift to the right.   See FIM for current functional status  Therapy session was ended early secondary to bowel incontinence. Patient was returned to room and PT assisted patient with toileting and changing clothing prior to leaving.   Therapy/Group: Individual Therapy  MITRISIN, HEATHER DPT Student 09/03/2011, 4:49 PM

## 2011-09-03 NOTE — Progress Notes (Signed)
Occupational Therapy Session Note  Patient Details  Name: Cindy Abbott MRN: [id number] Date of Birth: January 17, 1922  Today's Date: 09/03/2011 Time: 0930-1030 Time Calculation (min): 60 min  Short Term Goals: Week 2:  OT Short Term Goal 1 (Week 2): Pt will complete  UB dressing with setup and min cues for sequencing. OT Short Term Goal 2 (Week 2): Pt will complete LB dressing with min/steadying assist. OT Short Term Goal 3 (Week 2): Pt will complete toilet transfer with min assist with LRAD. OT Short Term Goal 4 (Week 2): Pt will complete grooming with setup assist with LUE at diminished level.  Skilled Therapeutic Interventions/Progress Updates:    Pt seen for ADL retraining at sink level secondary to decreased attention to task and refusal to bathe at shower level.  Increased cues required for initiation and sequencing this session secondary to pt being internally distracted and with decreased recall of sequencing with self-care task.  Pt with decreased attention to LUE, requiring mod-max cues to engage it in simple tasks throughout bathing and dressing.  Noted some swelling in Lt hand and positioned it in elevated position on pillow to attempt to decrease swelling.  Pt required multiple rest breaks throughout session, secondary to increased respiratory rate in standing and post standing activities.  Pt's son present towards end of session, but not wanting to provide assistance with bathing tasks.  Began discussion with pt's son regarding necessary equipment upon d/c and care level she is currently requiring.  Therapy Documentation Precautions:  Precautions Precautions: Fall Precaution Comments: Dys 1, Nectar thick liquids, TEDs donned OOB Restrictions Weight Bearing Restrictions: No General:   Vital Signs: Oxygen Therapy SpO2: 98 % O2 Device: None (Room air) Pain:   ADL: ADL Grooming: Moderate assistance Where Assessed-Grooming: Sitting at sink Upper Body Bathing:  Supervision/safety;Minimal cueing Where Assessed-Upper Body Bathing: Sitting at sink Lower Body Bathing: Minimal assistance Where Assessed-Lower Body Bathing: Standing at sink;Sitting at sink Upper Body Dressing: Minimal assistance Where Assessed-Upper Body Dressing: Sitting at sink Lower Body Dressing: Minimal assistance Where Assessed-Lower Body Dressing: Sitting at sink;Standing at sink Toileting: Moderate assistance Where Assessed-Toileting: Teacher, adult education: Moderate assistance Toilet Transfer Method: Stand pivot Toilet Transfer Equipment: Bedside commode ADL Comments: Pt requires cues for intiation and sequencing.  Pt with occasional Lt inattention, and is able to report frustrations with LUE functioning.  Pt with h/o COPD and asthma, requires multiple rest breaks. Exercises:   Other Treatments:    See FIM for current functional status  Therapy/Group: Individual Therapy  [name] 09/03/2011, 12:10 PM

## 2011-09-03 NOTE — Progress Notes (Signed)
Occupational Therapy Note  Patient Details  Name: Cindy Abbott MRN: [id number] Date of Birth: 19-Feb-1922 Today's Date: 09/03/2011 Time:  1045-1115  (30 min) Individual Therapy Pain:  None   Engaged in LUE edema management, sit to stand, and pt family education (son present).  Educated pt/son on edema control, retrograde massage, postioning of LUE with pillow, PROM.  Son observed but needs reinenfocement.  Was able to remove pt's ring after the above techniques which had been causing her discomfort.  Pt. Stood for 1-2 minutes which washing hand with minimal assist.  She tolerated session very well.  Humberto Seals 09/03/2011, 3:35 PM

## 2011-09-03 NOTE — Progress Notes (Signed)
Patient ID: Cindy Abbott, female   DOB: 1922-06-26, 78 y.o.   MRN: [id number]  Subjective/Complaints: Doing well Review of Systems  Genitourinary:       Retention  All other systems reviewed and are negative.     Objective: Vital Signs: Blood pressure 126/56, pulse 60, temperature 98.3 F (36.8 C), temperature source Oral, resp. rate 20, height 5\' 5"  (1.651 m), weight 70.2 kg (154 lb 12.2 oz), SpO2 100.00%. No results found. Results for orders placed during the hospital encounter of 08/23/11 (from the past 72 hour(s))  GLUCOSE, CAPILLARY     Status: Abnormal   Collection Time   08/31/11 11:41 AM      Component Value Range Comment   Glucose-Capillary 215 (*) 70 - 99 mg/dL    Comment 1 Notify RN     GLUCOSE, CAPILLARY     Status: Abnormal   Collection Time   08/31/11  4:30 PM      Component Value Range Comment   Glucose-Capillary 59 (*) 70 - 99 mg/dL   GLUCOSE, CAPILLARY     Status: Normal   Collection Time   08/31/11  4:49 PM      Component Value Range Comment   Glucose-Capillary 72  70 - 99 mg/dL   GLUCOSE, CAPILLARY     Status: Abnormal   Collection Time   08/31/11  5:23 PM      Component Value Range Comment   Glucose-Capillary 104 (*) 70 - 99 mg/dL   GLUCOSE, CAPILLARY     Status: Abnormal   Collection Time   08/31/11  7:32 PM      Component Value Range Comment   Glucose-Capillary 191 (*) 70 - 99 mg/dL   GLUCOSE, CAPILLARY     Status: Abnormal   Collection Time   08/31/11  9:13 PM      Component Value Range Comment   Glucose-Capillary 233 (*) 70 - 99 mg/dL   GLUCOSE, CAPILLARY     Status: Abnormal   Collection Time   09/01/11  7:14 AM      Component Value Range Comment   Glucose-Capillary 151 (*) 70 - 99 mg/dL    Comment 1 Notify RN     GLUCOSE, CAPILLARY     Status: Abnormal   Collection Time   09/01/11 11:19 AM      Component Value Range Comment   Glucose-Capillary 207 (*) 70 - 99 mg/dL    Comment 1 Notify RN     GLUCOSE, CAPILLARY     Status: Abnormal   Collection  Time   09/01/11  5:08 PM      Component Value Range Comment   Glucose-Capillary 101 (*) 70 - 99 mg/dL   GLUCOSE, CAPILLARY     Status: Abnormal   Collection Time   09/01/11  9:19 PM      Component Value Range Comment   Glucose-Capillary 144 (*) 70 - 99 mg/dL   GLUCOSE, CAPILLARY     Status: Abnormal   Collection Time   09/02/11  7:07 AM      Component Value Range Comment   Glucose-Capillary 161 (*) 70 - 99 mg/dL    Comment 1 Notify RN     GLUCOSE, CAPILLARY     Status: Abnormal   Collection Time   09/02/11 11:25 AM      Component Value Range Comment   Glucose-Capillary 203 (*) 70 - 99 mg/dL   GLUCOSE, CAPILLARY     Status: Abnormal   Collection Time   09/02/11  4:21 PM      Component Value Range Comment   Glucose-Capillary 165 (*) 70 - 99 mg/dL    Comment 1 Notify RN     GLUCOSE, CAPILLARY     Status: Normal   Collection Time   09/02/11  8:49 PM      Component Value Range Comment   Glucose-Capillary 96  70 - 99 mg/dL    Comment 1 Notify RN     GLUCOSE, CAPILLARY     Status: Abnormal   Collection Time   09/03/11  7:05 AM      Component Value Range Comment   Glucose-Capillary 122 (*) 70 - 99 mg/dL    Comment 1 Notify RN        HEENT: normal Cardio: RRR Resp: CTA B/L GI: BS positive Extremity:  No Edema Skin:   Intact Neuro: Confused, Flat, Normal Sensory and Abnormal Motor 4/5 on Left side UE and LE Musc/Skel:  Normal Skin is quite dry  Assessment/Plan: 1. Functional deficits secondary to R ICA infarct with L HP, Mild L neglect which require 3+ hours per day of interdisciplinary therapy in a comprehensive inpatient rehab setting. Physiatrist is providing close team supervision and 24 hour management of active medical problems listed below. Physiatrist and rehab team continue to assess barriers to discharge/monitor patient progress toward functional and medical goals. FIM: FIM - Bathing Bathing Steps Patient Completed: Chest;Right Arm;Left Arm;Abdomen;Front perineal  area;Buttocks;Right upper leg;Left upper leg Bathing: 4: Min-Patient completes 8-9 63f 10 parts or 75+ percent  FIM - Upper Body Dressing/Undressing Upper body dressing/undressing steps patient completed: Thread/unthread right bra strap;Thread/unthread left bra strap;Thread/unthread left sleeve of front closure shirt/dress;Thread/unthread right sleeve of front closure shirt/dress Upper body dressing/undressing: 3: Mod-Patient completed 50-74% of tasks FIM - Lower Body Dressing/Undressing Lower body dressing/undressing steps patient completed: Thread/unthread right underwear leg;Thread/unthread left underwear leg;Pull underwear up/down;Thread/unthread right pants leg;Thread/unthread left pants leg;Pull pants up/down;Don/Doff left sock;Don/Doff right sock Lower body dressing/undressing: 4: Min-Patient completed 75 plus % of tasks  FIM - Toileting Toileting steps completed by patient: Performs perineal hygiene Toileting Assistive Devices: Grab bar or rail for support Toileting: 2: Max-Patient completed 1 of 3 steps  FIM - Diplomatic Services operational officer Devices: Therapist, music Transfers: 6-To toilet/ BSC  FIM - Banker Devices: Bed rails;Arm rests Bed/Chair Transfer: 3: Bed > Chair or W/C: Mod A (lift or lower assist);3: Chair or W/C > Bed: Mod A (lift or lower assist)  FIM - Locomotion: Wheelchair Distance: 150 Locomotion: Wheelchair: 1: Total Assistance/staff pushes wheelchair (Pt<25%) FIM - Locomotion: Ambulation Locomotion: Ambulation Assistive Devices: Other (comment) (+2 HHA) Ambulation/Gait Assistance: 1: +2 Total assist Locomotion: Ambulation: 1: Two helpers  Comprehension Comprehension Mode: Auditory Comprehension: 4-Understands basic 75 - 89% of the time/requires cueing 10 - 24% of the time  Expression Expression Mode: Verbal Expression: 4-Expresses basic 75 - 89% of the time/requires cueing 10 - 24% of the time. Needs  helper to occlude trach/needs to repeat words.  Social Interaction Social Interaction: 5-Interacts appropriately 90% of the time - Needs monitoring or encouragement for participation or interaction.  Problem Solving Problem Solving: 2-Solves basic 25 - 49% of the time - needs direction more than half the time to initiate, plan or complete simple activities  Memory Memory: 2-Recognizes or recalls 25 - 49% of the time/requires cueing 51 - 75% of the time  Medical Problem List and Plan:  1. 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 DVT Prophylaxis/Anticoagulation: SCDs. Monitor for signs of DVT. Will consider Lovenox therapy  3. Dysphagia. Dysphagia 1 diet with thin liquids as per modified barium swallow 08/23/2011. Monitor for any signs of aspiration. Followup speech therapy. . Monitor hydration  . Latest creatinine 1.18 4. Neuropsych: This patient is not capable of making decisions on her own behalf.  5. Diabetes mellitus. Controlled Hemoglobin A1c of 8.2. Lantus insulin 10 units each bedtime as well as NovoLog 10 units before lunch and supper and 5 units before breakfast . Hold parameters added to 10 unit dose to help avoid hypoglycemia. Generally her am cbg's have been better. Follow for now 6. Hypertension. Clonidine patch 0.2 mg weekly,.imdur 30 mg daily, Lopressor 25 mg twice a day. Monitor the increased mobility . Patient denies any dizziness when up for therapies. No chest pain or shortness of breath 7. Escherichia coli UTI. resolved 8. CAD status post pacemaker. Patient denies any chest pain. Continue aspirin therapy   LOS (Days) 11 A FACE TO FACE EVALUATION WAS PERFORMED  Alfonso Shackett E 09/03/2011, 9:00 AM

## 2011-09-04 ENCOUNTER — Inpatient Hospital Stay (HOSPITAL_COMMUNITY): Payer: Medicare Other | Admitting: Physical Therapy

## 2011-09-04 LAB — GLUCOSE, CAPILLARY
Glucose-Capillary: 101 mg/dL — ABNORMAL HIGH (ref 70–99)
Glucose-Capillary: 176 mg/dL — ABNORMAL HIGH (ref 70–99)

## 2011-09-04 NOTE — Progress Notes (Signed)
Dr. Lenox Ahr notified of patient's BP 124/50 and due Lopressor 25 mg.  Order received:  Hold Lopressor dose tonight.

## 2011-09-04 NOTE — Progress Notes (Signed)
Occupational Therapy Note  Patient Details  Name: Cindy Abbott MRN: [id number] Date of Birth: August 13, 1922 Today's Date: 09/04/2011  Time:  1445-1545  (60 min) Pain:  None Group therapy  Engaged in Neuro UEG with focus on using LUE in weight bearing activities and gross assist with mod assist.  Pt.  Need mod cues to attend to her LUE and use accordingly.    Humberto Seals 09/04/2011, 4:59 PM

## 2011-09-04 NOTE — Progress Notes (Signed)
Patient ID: Cindy Abbott, female   DOB: [birth year], 79 y.o.   MRN: [id number]  Subjective/Complaints: Doing well. No new complaints Review of Systems  Genitourinary:       Retention  All other systems reviewed and are negative.     Objective: Vital Signs: Blood pressure 134/58, pulse 60, temperature 98.6 F (37 C), temperature source Oral, resp. rate 18, height 5\' 5"  (1.651 m), weight 154 lb 12.2 oz (70.2 kg), SpO2 98.00%. No results found. Results for orders placed during the hospital encounter of 08/23/11 (from the past 72 hour(s))  GLUCOSE, CAPILLARY     Status: Abnormal   Collection Time   09/01/11 11:19 AM      Component Value Range Comment   Glucose-Capillary 207 (*) 70 - 99 mg/dL    Comment 1 Notify RN     GLUCOSE, CAPILLARY     Status: Abnormal   Collection Time   09/01/11  5:08 PM      Component Value Range Comment   Glucose-Capillary 101 (*) 70 - 99 mg/dL   GLUCOSE, CAPILLARY     Status: Abnormal   Collection Time   09/01/11  9:19 PM      Component Value Range Comment   Glucose-Capillary 144 (*) 70 - 99 mg/dL   GLUCOSE, CAPILLARY     Status: Abnormal   Collection Time   09/02/11  7:07 AM      Component Value Range Comment   Glucose-Capillary 161 (*) 70 - 99 mg/dL    Comment 1 Notify RN     GLUCOSE, CAPILLARY     Status: Abnormal   Collection Time   09/02/11 11:25 AM      Component Value Range Comment   Glucose-Capillary 203 (*) 70 - 99 mg/dL   GLUCOSE, CAPILLARY     Status: Abnormal   Collection Time   09/02/11  4:21 PM      Component Value Range Comment   Glucose-Capillary 165 (*) 70 - 99 mg/dL    Comment 1 Notify RN     GLUCOSE, CAPILLARY     Status: Normal   Collection Time   09/02/11  8:49 PM      Component Value Range Comment   Glucose-Capillary 96  70 - 99 mg/dL    Comment 1 Notify RN     GLUCOSE, CAPILLARY     Status: Abnormal   Collection Time   09/03/11  7:05 AM      Component Value Range Comment   Glucose-Capillary 122 (*) 70 - 99 mg/dL    Comment 1  Notify RN     GLUCOSE, CAPILLARY     Status: Abnormal   Collection Time   09/03/11 11:21 AM      Component Value Range Comment   Glucose-Capillary 115 (*) 70 - 99 mg/dL    Comment 1 Notify RN     GLUCOSE, CAPILLARY     Status: Abnormal   Collection Time   09/03/11  3:28 PM      Component Value Range Comment   Glucose-Capillary 144 (*) 70 - 99 mg/dL    Comment 1 Notify RN     GLUCOSE, CAPILLARY     Status: Abnormal   Collection Time   09/03/11  4:28 PM      Component Value Range Comment   Glucose-Capillary 158 (*) 70 - 99 mg/dL    Comment 1 Notify RN     GLUCOSE, CAPILLARY     Status: Abnormal   Collection Time  09/03/11  9:04 PM      Component Value Range Comment   Glucose-Capillary 153 (*) 70 - 99 mg/dL    Comment 1 Notify RN        HEENT: normal Cardio: RRR Resp: CTA B/L GI: BS positive Extremity:  No Edema Skin:   Intact Neuro: Confused, Flat, Normal Sensory and Abnormal Motor 4/5 on Left side UE and LE Musc/Skel:  Normal Skin is quite dry  Assessment/Plan: 1. Functional deficits secondary to R ICA infarct with L HP, Mild L neglect which require 3+ hours per day of interdisciplinary therapy in a comprehensive inpatient rehab setting. Physiatrist is providing close team supervision and 24 hour management of active medical problems listed below. Physiatrist and rehab team continue to assess barriers to discharge/monitor patient progress toward functional and medical goals. FIM: FIM - Bathing Bathing Steps Patient Completed: Chest;Right Arm;Left Arm;Abdomen;Front perineal area;Buttocks;Right upper leg;Left upper leg Bathing: 4: Min-Patient completes 8-9 30f 10 parts or 75+ percent  FIM - Upper Body Dressing/Undressing Upper body dressing/undressing steps patient completed: Thread/unthread right bra strap;Thread/unthread left bra strap;Thread/unthread left sleeve of front closure shirt/dress;Thread/unthread right sleeve of front closure shirt/dress Upper body  dressing/undressing: 3: Mod-Patient completed 50-74% of tasks FIM - Lower Body Dressing/Undressing Lower body dressing/undressing steps patient completed: Thread/unthread right underwear leg;Thread/unthread left underwear leg;Pull underwear up/down;Thread/unthread right pants leg;Thread/unthread left pants leg;Pull pants up/down Lower body dressing/undressing: 4: Min-Patient completed 75 plus % of tasks  FIM - Toileting Toileting steps completed by patient: Performs perineal hygiene;Adjust clothing after toileting Toileting Assistive Devices: Grab bar or rail for support Toileting: 3: Mod-Patient completed 2 of 3 steps  FIM - Diplomatic Services operational officer Devices: Grab bars Toilet Transfers: 4-To toilet/BSC: Min A (steadying Pt. > 75%);4-From toilet/BSC: Min A (steadying Pt. > 75%)  FIM - Bed/Chair Transfer Bed/Chair Transfer Assistive Devices: Bed rails;Arm rests Bed/Chair Transfer: 4: Bed > Chair or W/C: Min A (steadying Pt. > 75%);4: Chair or W/C > Bed: Min A (steadying Pt. > 75%)  FIM - Locomotion: Wheelchair Distance: 150 Locomotion: Wheelchair: 1: Total Assistance/staff pushes wheelchair (Pt<25%) FIM - Locomotion: Ambulation Locomotion: Ambulation Assistive Devices: Parallel bars Ambulation/Gait Assistance: 4: Min assist Locomotion: Ambulation: 1: Travels less than 50 ft with minimal assistance (Pt.>75%)  Comprehension Comprehension Mode: Auditory Comprehension: 4-Understands basic 75 - 89% of the time/requires cueing 10 - 24% of the time  Expression Expression Mode: Verbal Expression: 4-Expresses basic 75 - 89% of the time/requires cueing 10 - 24% of the time. Needs helper to occlude trach/needs to repeat words.  Social Interaction Social Interaction: 5-Interacts appropriately 90% of the time - Needs monitoring or encouragement for participation or interaction.  Problem Solving Problem Solving: 2-Solves basic 25 - 49% of the time - needs direction more than  half the time to initiate, plan or complete simple activities  Memory Memory: 2-Recognizes or recalls 25 - 49% of the time/requires cueing 51 - 75% of the time  Medical Problem List and Plan:  1. 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 DVT Prophylaxis/Anticoagulation: SCDs. Monitor for signs of DVT. Will consider Lovenox therapy  3. Dysphagia. Dysphagia 1 diet with thin liquids as per modified barium swallow 08/23/2011. Monitor for any signs of aspiration. Followup speech therapy. . Monitor hydration  . Latest creatinine 1.18 4. Neuropsych: This patient is not capable of making decisions on her own behalf.  5. Diabetes mellitus. Controlled Hemoglobin A1c of 8.2. Lantus insulin 10 units each bedtime as well as  NovoLog 10 units before lunch and supper and 5 units before breakfast . Hold parameters added to 10 unit dose to help avoid hypoglycemia. Generally her am cbg's have been better. Follow for now 6. Hypertension. Clonidine patch 0.2 mg weekly,.imdur 30 mg daily, Lopressor 25 mg twice a day. Monitor the increased mobility . Patient denies any dizziness when up for therapies. No chest pain or shortness of breath 7. Escherichia coli UTI. resolved 8. CAD status post pacemaker. Patient denies any chest pain. Continue aspirin therapy   LOS (Days) 12 A FACE TO FACE EVALUATION WAS PERFORMED  Alex Plotnikov 09/04/2011, 9:41 AM

## 2011-09-05 ENCOUNTER — Inpatient Hospital Stay (HOSPITAL_COMMUNITY): Payer: Medicare Other | Admitting: Physical Therapy

## 2011-09-05 LAB — GLUCOSE, CAPILLARY: Glucose-Capillary: 134 mg/dL — ABNORMAL HIGH (ref 70–99)

## 2011-09-05 NOTE — Progress Notes (Signed)
Patient ID: Cindy Abbott, female   DOB: 1922-06-24, 78 y.o.   MRN: [id number]  Subjective/Complaints: Doing well. No new complaints Review of Systems  Genitourinary:       Retention  All other systems reviewed and are negative.     Objective: Vital Signs: Blood pressure 130/62, pulse 61, temperature 98.4 F (36.9 C), temperature source Oral, resp. rate 19, height 5\' 5"  (1.651 m), weight 154 lb 12.2 oz (70.2 kg), SpO2 100.00%. No results found. Results for orders placed during the hospital encounter of 08/23/11 (from the past 72 hour(s))  GLUCOSE, CAPILLARY     Status: Abnormal   Collection Time   09/02/11 11:25 AM      Component Value Range Comment   Glucose-Capillary 203 (*) 70 - 99 mg/dL   GLUCOSE, CAPILLARY     Status: Abnormal   Collection Time   09/02/11  4:21 PM      Component Value Range Comment   Glucose-Capillary 165 (*) 70 - 99 mg/dL    Comment 1 Notify RN     GLUCOSE, CAPILLARY     Status: Normal   Collection Time   09/02/11  8:49 PM      Component Value Range Comment   Glucose-Capillary 96  70 - 99 mg/dL    Comment 1 Notify RN     GLUCOSE, CAPILLARY     Status: Abnormal   Collection Time   09/03/11  7:05 AM      Component Value Range Comment   Glucose-Capillary 122 (*) 70 - 99 mg/dL    Comment 1 Notify RN     GLUCOSE, CAPILLARY     Status: Abnormal   Collection Time   09/03/11 11:21 AM      Component Value Range Comment   Glucose-Capillary 115 (*) 70 - 99 mg/dL    Comment 1 Notify RN     GLUCOSE, CAPILLARY     Status: Abnormal   Collection Time   09/03/11  3:28 PM      Component Value Range Comment   Glucose-Capillary 144 (*) 70 - 99 mg/dL    Comment 1 Notify RN     GLUCOSE, CAPILLARY     Status: Abnormal   Collection Time   09/03/11  4:28 PM      Component Value Range Comment   Glucose-Capillary 158 (*) 70 - 99 mg/dL    Comment 1 Notify RN     GLUCOSE, CAPILLARY     Status: Abnormal   Collection Time   09/03/11  9:04 PM      Component Value Range Comment     Glucose-Capillary 153 (*) 70 - 99 mg/dL    Comment 1 Notify RN     GLUCOSE, CAPILLARY     Status: Abnormal   Collection Time   09/04/11  7:27 AM      Component Value Range Comment   Glucose-Capillary 146 (*) 70 - 99 mg/dL    Comment 1 Notify RN     GLUCOSE, CAPILLARY     Status: Abnormal   Collection Time   09/04/11 11:21 AM      Component Value Range Comment   Glucose-Capillary 136 (*) 70 - 99 mg/dL    Comment 1 Notify RN     GLUCOSE, CAPILLARY     Status: Abnormal   Collection Time   09/04/11  4:16 PM      Component Value Range Comment   Glucose-Capillary 101 (*) 70 - 99 mg/dL    Comment 1 Notify  RN     GLUCOSE, CAPILLARY     Status: Abnormal   Collection Time   09/04/11  8:38 PM      Component Value Range Comment   Glucose-Capillary 176 (*) 70 - 99 mg/dL    Comment 1 Notify RN     GLUCOSE, CAPILLARY     Status: Abnormal   Collection Time   09/05/11  8:02 AM      Component Value Range Comment   Glucose-Capillary 134 (*) 70 - 99 mg/dL    Comment 1 Notify RN        HEENT: normal Cardio: RRR Resp: CTA B/L GI: BS positive Extremity:  No Edema Skin:   Intact Neuro: Confused, Flat, Normal Sensory and Abnormal Motor 4/5 on Left side UE and LE Musc/Skel:  Normal Skin is quite dry  Assessment/Plan: 1. Functional deficits secondary to R ICA infarct with L HP, Mild L neglect which require 3+ hours per day of interdisciplinary therapy in a comprehensive inpatient rehab setting. Physiatrist is providing close team supervision and 24 hour management of active medical problems listed below. Physiatrist and rehab team continue to assess barriers to discharge/monitor patient progress toward functional and medical goals. FIM: FIM - Bathing Bathing Steps Patient Completed: Chest;Right Arm;Left Arm;Abdomen;Front perineal area Bathing: 3: Mod-Patient completes 5-7 26f 10 parts or 50-74%  FIM - Upper Body Dressing/Undressing Upper body dressing/undressing steps patient completed:  Thread/unthread right bra strap;Thread/unthread left bra strap Upper body dressing/undressing: 2: Max-Patient completed 25-49% of tasks FIM - Lower Body Dressing/Undressing Lower body dressing/undressing steps patient completed: Thread/unthread right pants leg;Thread/unthread right underwear leg Lower body dressing/undressing: 2: Max-Patient completed 25-49% of tasks  FIM - Toileting Toileting steps completed by patient: Performs perineal hygiene;Adjust clothing after toileting Toileting Assistive Devices: Grab bar or rail for support Toileting: 2: Max-Patient completed 1 of 3 steps  FIM - Diplomatic Services operational officer Devices: Grab bars Toilet Transfers: 4-To toilet/BSC: Min A (steadying Pt. > 75%);4-From toilet/BSC: Min A (steadying Pt. > 75%)  FIM - Bed/Chair Transfer Bed/Chair Transfer Assistive Devices: Bed rails;Arm rests Bed/Chair Transfer: 3: Bed > Chair or W/C: Mod A (lift or lower assist);3: Sit > Supine: Mod A (lifting assist/Pt. 50-74%/lift 2 legs);3: Supine > Sit: Mod A (lifting assist/Pt. 50-74%/lift 2 legs  FIM - Locomotion: Wheelchair Distance: 150 Locomotion: Wheelchair: 1: Total Assistance/staff pushes wheelchair (Pt<25%) FIM - Locomotion: Ambulation Locomotion: Ambulation Assistive Devices: Parallel bars Ambulation/Gait Assistance: 4: Min assist Locomotion: Ambulation: 1: Travels less than 50 ft with minimal assistance (Pt.>75%)  Comprehension Comprehension Mode: Auditory Comprehension: 4-Understands basic 75 - 89% of the time/requires cueing 10 - 24% of the time  Expression Expression Mode: Verbal Expression: 4-Expresses basic 75 - 89% of the time/requires cueing 10 - 24% of the time. Needs helper to occlude trach/needs to repeat words.  Social Interaction Social Interaction: 5-Interacts appropriately 90% of the time - Needs monitoring or encouragement for participation or interaction.  Problem Solving Problem Solving: 2-Solves basic 25 - 49%  of the time - needs direction more than half the time to initiate, plan or complete simple activities  Memory Memory: 2-Recognizes or recalls 25 - 49% of the time/requires cueing 51 - 75% of the time  Medical Problem List and Plan:  1. 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 DVT Prophylaxis/Anticoagulation: SCDs. Monitor for signs of DVT. Will consider Lovenox therapy  3. Dysphagia. Dysphagia 1 diet with thin liquids as per modified barium swallow 08/23/2011. Monitor for any  signs of aspiration. Followup speech therapy. . Monitor hydration  . Latest creatinine 1.18 4. Neuropsych: This patient is not capable of making decisions on her own behalf.  5. Diabetes mellitus. Controlled Hemoglobin A1c of 8.2. Lantus insulin 10 units each bedtime as well as NovoLog 10 units before lunch and supper and 5 units before breakfast . Hold parameters added to 10 unit dose to help avoid hypoglycemia. Generally her am cbg's have been better. Follow for now 6. Hypertension. Clonidine patch 0.2 mg weekly,.imdur 30 mg daily, Lopressor 25 mg twice a day. Monitor the increased mobility . Patient denies any dizziness when up for therapies. No chest pain or shortness of breath 7. Escherichia coli UTI. resolved 8. CAD status post pacemaker. Patient denies any chest pain. Continue aspirin therapy   LOS (Days) 13 A FACE TO FACE EVALUATION WAS PERFORMED  Alex Plotnikov 09/05/2011, 9:37 AM

## 2011-09-05 NOTE — Progress Notes (Signed)
Physical Therapy Note  Patient Details  Name: Cindy Abbott MRN: [id number] Date of Birth: [birth year] Today's Date: 09/05/2011  1330-1415 (45 minutes) group  Pain: no complaint of pain Pt participated in PT group session focused on bilateral LE strengthening - bilateral X 15 heel slides, hip abduction , ankle pumps, SAQs. Pt required mod tactile cues to follow directions.   Cindy Abbott,JIM 09/05/2011, 8:02 AM

## 2011-09-06 ENCOUNTER — Inpatient Hospital Stay (HOSPITAL_COMMUNITY): Payer: Medicare Other | Admitting: Speech Pathology

## 2011-09-06 ENCOUNTER — Inpatient Hospital Stay (HOSPITAL_COMMUNITY): Payer: Medicare Other | Admitting: Physical Therapy

## 2011-09-06 ENCOUNTER — Inpatient Hospital Stay (HOSPITAL_COMMUNITY): Payer: Medicare Other | Admitting: Occupational Therapy

## 2011-09-06 DIAGNOSIS — G8110 Spastic hemiplegia affecting unspecified side: Secondary | ICD-10-CM

## 2011-09-06 DIAGNOSIS — Z5189 Encounter for other specified aftercare: Secondary | ICD-10-CM

## 2011-09-06 DIAGNOSIS — I6330 Cerebral infarction due to thrombosis of unspecified cerebral artery: Secondary | ICD-10-CM

## 2011-09-06 LAB — GLUCOSE, CAPILLARY
Glucose-Capillary: 119 mg/dL — ABNORMAL HIGH (ref 70–99)
Glucose-Capillary: 118 mg/dL — ABNORMAL HIGH (ref 70–99)
Glucose-Capillary: 196 mg/dL — ABNORMAL HIGH (ref 70–99)

## 2011-09-06 NOTE — Progress Notes (Signed)
Patient ID: Cindy Abbott, female   DOB: May 19, 1922, 78 y.o.   MRN: [id number]  Subjective/Complaints: Doing well Review of Systems  Genitourinary:       Retention  All other systems reviewed and are negative.     Objective: Vital Signs: Blood pressure 147/77, pulse 60, temperature 97.8 F (36.6 C), temperature source Oral, resp. rate 18, height 5\' 5"  (1.651 m), weight 70.2 kg (154 lb 12.2 oz), SpO2 100.00%. No results found. Results for orders placed during the hospital encounter of 08/23/11 (from the past 72 hour(s))  GLUCOSE, CAPILLARY     Status: Abnormal   Collection Time   09/03/11 11:21 AM      Component Value Range Comment   Glucose-Capillary 115 (*) 70 - 99 mg/dL    Comment 1 Notify RN     GLUCOSE, CAPILLARY     Status: Abnormal   Collection Time   09/03/11  3:28 PM      Component Value Range Comment   Glucose-Capillary 144 (*) 70 - 99 mg/dL    Comment 1 Notify RN     GLUCOSE, CAPILLARY     Status: Abnormal   Collection Time   09/03/11  4:28 PM      Component Value Range Comment   Glucose-Capillary 158 (*) 70 - 99 mg/dL    Comment 1 Notify RN     GLUCOSE, CAPILLARY     Status: Abnormal   Collection Time   09/03/11  9:04 PM      Component Value Range Comment   Glucose-Capillary 153 (*) 70 - 99 mg/dL    Comment 1 Notify RN     GLUCOSE, CAPILLARY     Status: Abnormal   Collection Time   09/04/11  7:27 AM      Component Value Range Comment   Glucose-Capillary 146 (*) 70 - 99 mg/dL    Comment 1 Notify RN     GLUCOSE, CAPILLARY     Status: Abnormal   Collection Time   09/04/11 11:21 AM      Component Value Range Comment   Glucose-Capillary 136 (*) 70 - 99 mg/dL    Comment 1 Notify RN     GLUCOSE, CAPILLARY     Status: Abnormal   Collection Time   09/04/11  4:16 PM      Component Value Range Comment   Glucose-Capillary 101 (*) 70 - 99 mg/dL    Comment 1 Notify RN     GLUCOSE, CAPILLARY     Status: Abnormal   Collection Time   09/04/11  8:38 PM      Component Value  Range Comment   Glucose-Capillary 176 (*) 70 - 99 mg/dL    Comment 1 Notify RN     GLUCOSE, CAPILLARY     Status: Abnormal   Collection Time   09/05/11  8:02 AM      Component Value Range Comment   Glucose-Capillary 134 (*) 70 - 99 mg/dL    Comment 1 Notify RN     GLUCOSE, CAPILLARY     Status: Abnormal   Collection Time   09/05/11 11:28 AM      Component Value Range Comment   Glucose-Capillary 144 (*) 70 - 99 mg/dL    Comment 1 Notify RN     GLUCOSE, CAPILLARY     Status: Normal   Collection Time   09/05/11  4:23 PM      Component Value Range Comment   Glucose-Capillary 94  70 - 99 mg/dL  Comment 1 Notify RN     GLUCOSE, CAPILLARY     Status: Abnormal   Collection Time   09/05/11  9:11 PM      Component Value Range Comment   Glucose-Capillary 157 (*) 70 - 99 mg/dL    Comment 1 Notify RN     GLUCOSE, CAPILLARY     Status: Abnormal   Collection Time   09/06/11  7:15 AM      Component Value Range Comment   Glucose-Capillary 119 (*) 70 - 99 mg/dL    Comment 1 Notify RN        HEENT: normal Cardio: RRR Resp: CTA B/L GI: BS positive Extremity:  No Edema Skin:   Intact Neuro: Confused, Flat, Normal Sensory and Abnormal Motor 4/5 on Left side UE and LE Musc/Skel:  Normal Skin is quite dry  Assessment/Plan: 1. Functional deficits secondary to R ICA infarct with L HP, Mild L neglect which require 3+ hours per day of interdisciplinary therapy in a comprehensive inpatient rehab setting. Physiatrist is providing close team supervision and 24 hour management of active medical problems listed below. Physiatrist and rehab team continue to assess barriers to discharge/monitor patient progress toward functional and medical goals. FIM: FIM - Bathing Bathing Steps Patient Completed: Chest;Right Arm;Left Arm;Abdomen;Front perineal area Bathing: 3: Mod-Patient completes 5-7 29f 10 parts or 50-74%  FIM - Upper Body Dressing/Undressing Upper body dressing/undressing steps patient completed:  Thread/unthread right sleeve of pullover shirt/dresss;Thread/unthread left sleeve of pullover shirt/dress;Put head through opening of pull over shirt/dress;Pull shirt over trunk Upper body dressing/undressing: 4: Min-Patient completed 75 plus % of tasks FIM - Lower Body Dressing/Undressing Lower body dressing/undressing steps patient completed: Thread/unthread right underwear leg;Thread/unthread left underwear leg;Pull underwear up/down;Thread/unthread right pants leg;Thread/unthread left pants leg;Pull pants up/down Lower body dressing/undressing: 4: Min-Patient completed 75 plus % of tasks  FIM - Toileting Toileting steps completed by patient: Performs perineal hygiene Toileting Assistive Devices: Grab bar or rail for support Toileting: 2: Max-Patient completed 1 of 3 steps  FIM - Diplomatic Services operational officer Devices: Grab bars Toilet Transfers: 4-From toilet/BSC: Min A (steadying Pt. > 75%);4-To toilet/BSC: Min A (steadying Pt. > 75%)  FIM - Bed/Chair Transfer Bed/Chair Transfer Assistive Devices: Bed rails;Arm rests Bed/Chair Transfer: 3: Chair or W/C > Bed: Mod A (lift or lower assist);3: Bed > Chair or W/C: Mod A (lift or lower assist);3: Sit > Supine: Mod A (lifting assist/Pt. 50-74%/lift 2 legs);3: Supine > Sit: Mod A (lifting assist/Pt. 50-74%/lift 2 legs  FIM - Locomotion: Wheelchair Distance: 150 Locomotion: Wheelchair: 1: Total Assistance/staff pushes wheelchair (Pt<25%) FIM - Locomotion: Ambulation Locomotion: Ambulation Assistive Devices: Parallel bars Ambulation/Gait Assistance: 4: Min assist Locomotion: Ambulation: 1: Travels less than 50 ft with minimal assistance (Pt.>75%)  Comprehension Comprehension Mode: Auditory Comprehension: 4-Understands basic 75 - 89% of the time/requires cueing 10 - 24% of the time  Expression Expression Mode: Verbal Expression: 4-Expresses basic 75 - 89% of the time/requires cueing 10 - 24% of the time. Needs helper to  occlude trach/needs to repeat words.  Social Interaction Social Interaction: 5-Interacts appropriately 90% of the time - Needs monitoring or encouragement for participation or interaction.  Problem Solving Problem Solving: 2-Solves basic 25 - 49% of the time - needs direction more than half the time to initiate, plan or complete simple activities  Memory Memory: 2-Recognizes or recalls 25 - 49% of the time/requires cueing 51 - 75% of the time  Medical Problem List and Plan:  1. Right frontal  and parietal thrombotic infarct with left hemiparesthesias  2. DVT Prophylaxis/Anticoagulation: SCDs. Monitor for signs of DVT. Will consider Lovenox therapy  3. Dysphagia. Dysphagia 1 diet with thin liquids as per modified barium swallow 08/23/2011. Monitor for any signs of aspiration. Followup speech therapy. . Monitor hydration  . Latest creatinine 1.18 4. Neuropsych: This patient is not capable of making decisions on her own behalf.  5. Diabetes mellitus. Controlled Hemoglobin A1c of 8.2. Lantus insulin 10 units each bedtime as well as NovoLog 10 units before lunch and supper and 5 units before breakfast . Hold parameters added to 10 unit dose to help avoid hypoglycemia. Generally her am cbg's have been better. Follow for now 6. Hypertension. Clonidine patch 0.2 mg weekly,.imdur 30 mg daily, Lopressor 25 mg twice a day. Monitor the increased mobility . Patient denies any dizziness when up for therapies. No chest pain or shortness of breath 7. Escherichia coli UTI. resolved 8. CAD status post pacemaker. Patient denies any chest pain. Continue aspirin therapy 9.  Anemia f/u CBC last Hgb was 9.5 on 8/20  LOS (Days) 14 A FACE TO FACE EVALUATION WAS PERFORMED  Cindy Abbott E 09/06/2011, 9:08 AM

## 2011-09-06 NOTE — Progress Notes (Signed)
Physical Therapy Session Note  Patient Details  Name: Cindy Abbott MRN: [id number] Date of Birth: 1922-09-16  Today's Date: 09/06/2011 Time: 4098-1191 Time Calculation (min): 28 min  Short Term Goals: Week 2:  PT Short Term Goal 1 (Week 2): Patient will perform gait x 25' with max A and LRAD in controlled environment  PT Short Term Goal 1 - Progress (Week 2): Partly met PT Short Term Goal 2 (Week 2): Patient will perform standing balance and pre gait training with max A x 5 minutes  PT Short Term Goal 2 - Progress (Week 2): Met PT Short Term Goal 3 (Week 2): Patient will perform bed <> w/c transfers to L and R with mod A PT Short Term Goal 3 - Progress (Week 2): Met PT Short Term Goal 4 (Week 2): Patient will perform bed mobility on flat bed to L and R with min A PT Short Term Goal 4 - Progress (Week 2): Met  Skilled Therapeutic Interventions/Progress Updates:    This one on one session with PT focused on transfers from [facility] to [facility] with RW and safety with transfers.  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  See details below for assist levels.  She needed max cues for safety with transfers especially hand placement to push up and sit dow.  Cues for upright posture and legs sequencing while using the RW to transfer.  Ended the session with 5 sit<-> stands reviewing safe hand placement again.   Therapy Documentation Precautions:  Precautions Precautions: Fall Precaution Comments: Dys 1, Nectar thick liquids, TEDs donned OOB Restrictions Weight Bearing Restrictions: No General:   Vital Signs: Oxygen Therapy O2 Device: None (Room air) Pain: Pain Assessment Pain Assessment: No/denies pain Pain Score: 0-No pain Mobility: Transfers Sit to Stand: 3: Mod assist;With upper extremity assist;With armrests;From chair/3-in-1 Sit to Stand Details: Tactile cues for initiation;Tactile cues for weight  shifting;Verbal cues for precautions/safety;Verbal cues for technique;Verbal cues for sequencing;Manual facilitation for weight shifting;Manual facilitation for placement Stand to Sit: 3: Mod assist;With upper extremity assist;With armrests;To chair/3-in-1 Stand to Sit Details (indicate cue type and reason): Tactile cues for initiation;Tactile cues for sequencing;Tactile cues for weight shifting;Tactile cues for posture;Tactile cues for placement;Verbal cues for sequencing;Verbal cues for technique;Verbal cues for precautions/safety Stand Pivot Transfers: 3: Mod assist;With armrests Stand Pivot Transfer Details: Tactile cues for sequencing;Tactile cues for weight shifting;Tactile cues for posture;Verbal cues for sequencing;Verbal cues for technique;Verbal cues for precautions/safety Locomotion : Ambulation Ambulation/Gait Assistance: 4: Min assist       See FIM for current functional status  Therapy/Group: Individual Therapy  Cindy Abbott, PT, DPT [phone]   09/06/2011, 12:54 PM

## 2011-09-06 NOTE — Plan of Care (Signed)
Problem: RH BOWEL ELIMINATION Goal: RH STG MANAGE BOWEL W/MEDICATION W/ASSISTANCE STG Manage Bowel with Medication withminimal Assistance.  Outcome: Progressing Last Bowel Movement 09-05-11 after sorbitol per report

## 2011-09-06 NOTE — Progress Notes (Signed)
Physical Therapy Weekly Progress Note  Patient Details  Name: Cindy Abbott MRN: [id number] Date of Birth: 09-30-1922  Today's Date: 09/06/2011 Time: 1300-1355 Time Calculation (min): 55 min  Patient continues to make steady progress towards PT LTG and has met 4 of 4 week 2 short term goals.  Patient continues to require min A for bed mobility flat bed, mod overall for bed <> w/c and w/c <> toilet transfers, mod A for standing balance during functional activities and mod-max for gait short distances with HHA and stair negotiation with one rail.  When patient is fatigued, SOB patient can require up to total A for these activities secondary to increased L sided weakness and impaired attention.  Patient's son has been present for family education and mobility training and feels comfortable to assist with stair negotiation, bed <> w/c transfers and bed <> bathroom transfers.  Patient continues to demonstrate the following deficits: L hemiplegia, impaired activity tolerance and endurance, impaired memory, attention, sequencing, impaired postural control, dynamic sitting and standing balance, impaired gait and therefore will continue to benefit from skilled PT intervention to enhance overall performance with activity tolerance, balance, postural control, ability to compensate for deficits, functional use of  left upper extremity and left lower extremity, attention and coordination.  Patient progressing toward long term goals.  Continue plan of care. Will likely reactivate home ambulation goal secondary to improvement in gait short distances this week.  PT Short Term Goals Week 2:  PT Short Term Goal 1 (Week 2): Patient will perform gait x 25' with max A and LRAD in controlled environment  PT Short Term Goal 1 - Progress (Week 2): Met PT Short Term Goal 2 (Week 2): Patient will perform standing balance and pre gait training with max A x 5 minutes  PT Short Term Goal 2 - Progress (Week 2): Met PT Short Term  Goal 3 (Week 2): Patient will perform bed <> w/c transfers to L and R with mod A PT Short Term Goal 3 - Progress (Week 2): Met PT Short Term Goal 4 (Week 2): Patient will perform bed mobility on flat bed to L and R with min A PT Short Term Goal 4 - Progress (Week 2): Met Week 3:  PT Short Term Goal 1 (Week 3): = LTG  Skilled Therapeutic Interventions/Progress Updates:   Patient's son present for family education; confirmed with son set up of home, 3 STE with 2 rails but unable to reach both so performed with RUE support on rail and L HHA and did not use RW for household mobility but did require HHA when ambulating outdoors and to get into car.  Son expects patient to be w/c level upon D/C and feels that w/c will fit through all doors except patient's bathroom door but he would like to ambulate with her from bed > toilet with HHA upon D/C and use his master bathroom with walk in shower for bathing/dressing.    Demonstrated to son how to perform stand pivot transfers w/c <> car with mod A HHA with patient placing LE into and out of car with supervision, w/c parts management and break down and set up for transfers.  Son have repeat demonstration but performed more of a total sit > stand from w/c; encouraged son to allow patient increased time to initiate and complete sit > stand.  Also had son demonstrate how to perform up and down stairs with R rail and L HHA x 4 steps (6.5in tall) with mod A  overall; son lifting under patient's L shoulder; encouraged son to assist patient around hips to avoid subluxing L shoulder secondary to weakness.  In ADL apartment demonstrated how to perform stand pivot transfers w/c <> bed with min-mod A and sit > supine on flat bed with min A.  Patient performed rolling in bed to L and R with min A and supine > sit EOB with min A for safety secondary to fear of rolling off bed.  Performed transfer bed > "toilet" > w/c with HHA x 10' x 2 with mod-max A overall with verbal and tactile  cues for increased step length LLE.  Will continue to address gait and stairs with patient to improve safety and independence to minimize caregiver burden secondary to son having low back impairments/pain.    Therapy Documentation Precautions:  Precautions Precautions: Fall Precaution Comments: Dys 1, Nectar thick liquids, TEDs donned OOB Restrictions Weight Bearing Restrictions: No  Pain:  No c/o pain  See FIM for current functional status  Therapy/Group: Individual Therapy  Edman Circle [facility] 09/06/2011, 2:24 PM

## 2011-09-06 NOTE — Progress Notes (Signed)
Occupational Therapy Session Note  Patient Details  Name: Cindy Abbott MRN: [id number] Date of Birth: Mar 19, 1922  Today's Date: 09/06/2011 Time: 1100-1155 Time Calculation (min): 55 min  Short Term Goals: Week 2:  OT Short Term Goal 1 (Week 2): Pt will complete  UB dressing with setup and min cues for sequencing. OT Short Term Goal 2 (Week 2): Pt will complete LB dressing with min/steadying assist. OT Short Term Goal 3 (Week 2): Pt will complete toilet transfer with min assist with LRAD. OT Short Term Goal 4 (Week 2): Pt will complete grooming with setup assist with LUE at diminished level.  Skilled Therapeutic Interventions/Progress Updates:    1:1 self care retraining at sink level with focus on attention to task, orientation to place, time of day and date with max A, sit to stand, standing balance, transfers to toilet with min A , toileting with steady A, use of left hand as gross assist with min A, bilateral UE tasks with clothing management with steady A for balance in standing, attention to left environment and body with mod -max questioning cuing. Son arrived at end of session for family educ session at 1:00pm  Therapy Documentation Precautions:  Precautions Precautions: Fall Precaution Comments: Dys 1, Nectar thick liquids, TEDs donned OOB Restrictions Weight Bearing Restrictions: No Pain: Pain Assessment Pain Assessment: No/denies pain  See FIM for current functional status  Therapy/Group: Individual Therapy  Roney Mans [facility] 09/06/2011, 11:57 AM

## 2011-09-06 NOTE — Progress Notes (Signed)
Speech Language Pathology Daily Session Note  Patient Details  Name: Cindy Abbott MRN: [id number] Date of Birth: January 04, 1923  Today's Date: 09/06/2011 Time: 4098-1191 Time Calculation (min): 40 min  Short Term Goals: Week 2: SLP Short Term Goal 1 (Week 2): Patient will consume Dys.2 textures and thin liquids with mod assist to utilize compensatory strategies to prevent overt s/s of aspiraiton  SLP Short Term Goal 2 (Week 2): Patient will consume trials of Dys.3 textures with mod assist verbal cues for efficient mastication and to monitor oral residue. SLP Short Term Goal 3 (Week 2): Patient will attend to left upper extremity with mod assist semantic cues during functional activities.  SLP Short Term Goal 4 (Week 2): Patient will attend to left of environment with mod assist semantic cues during functional tasks. SLP Short Term Goal 5 (Week 2): Patient will utilize external aids for orientation with mod assist semantic cues.  Skilled Therapeutic Interventions: Treatment session focused on addressing dysphagia and cognition goals;  SLP facilitated session with increased wait time and supervision level semantic uces to take small sips, utilize 2 swallows and intermittent throat clears. Patient displayed no overt s/s of aspiration throughout session.  SLP also facilitated session with max assist multi modal cues for orientation today.    FIM:  Comprehension Comprehension Mode: Auditory Comprehension: 4-Understands basic 75 - 89% of the time/requires cueing 10 - 24% of the time Expression Expression Mode: Verbal Expression: 4-Expresses basic 75 - 89% of the time/requires cueing 10 - 24% of the time. Needs helper to occlude trach/needs to repeat words. Social Interaction Social Interaction: 5-Interacts appropriately 90% of the time - Needs monitoring or encouragement for participation or interaction. Problem Solving Problem Solving: 2-Solves basic 25 - 49% of the time - needs direction more than  half the time to initiate, plan or complete simple activities Memory Memory: 2-Recognizes or recalls 25 - 49% of the time/requires cueing 51 - 75% of the time  Pain Pain Assessment Pain Assessment: No/denies pain  Therapy/Group: Individual Therapy  Charlane Ferretti., CCC-SLP [phone]  Cindy Abbott 09/06/2011, 10:16 AM

## 2011-09-06 NOTE — Plan of Care (Signed)
Problem: RH BLADDER ELIMINATION Goal: RH STG MANAGE BLADDER WITH ASSISTANCE STG Manage Bladder With minimal Assistance  Outcome: Progressing Does not call, lets staff know when in room if needs to toilet

## 2011-09-06 NOTE — Progress Notes (Signed)
Social Work Patient ID: Cindy Abbott, female   DOB: 05/15/1922, 78 y.o.   MRN: [id number] Son was here to attend therapies and begin family education.  He reports it went well and he has no concerns regarding providing 24 hour care to Mom at Discharge.  Discussed DME and follow up therapies, consult with team.  He plans to come in again and work toward discharge date 9/10.

## 2011-09-07 ENCOUNTER — Inpatient Hospital Stay (HOSPITAL_COMMUNITY): Payer: Medicare Other | Admitting: Speech Pathology

## 2011-09-07 ENCOUNTER — Inpatient Hospital Stay (HOSPITAL_COMMUNITY): Payer: Medicare Other | Admitting: Occupational Therapy

## 2011-09-07 ENCOUNTER — Inpatient Hospital Stay (HOSPITAL_COMMUNITY): Payer: Medicare Other | Admitting: Physical Therapy

## 2011-09-07 DIAGNOSIS — D649 Anemia, unspecified: Secondary | ICD-10-CM

## 2011-09-07 DIAGNOSIS — I6330 Cerebral infarction due to thrombosis of unspecified cerebral artery: Secondary | ICD-10-CM

## 2011-09-07 DIAGNOSIS — Z5189 Encounter for other specified aftercare: Secondary | ICD-10-CM

## 2011-09-07 DIAGNOSIS — G8110 Spastic hemiplegia affecting unspecified side: Secondary | ICD-10-CM

## 2011-09-07 LAB — PREPARE RBC (CROSSMATCH)

## 2011-09-07 LAB — CBC
MCH: 25.1 pg — ABNORMAL LOW (ref 26.0–34.0)
MCHC: 32.4 g/dL (ref 30.0–36.0)
MCV: 77.4 fL — ABNORMAL LOW (ref 78.0–100.0)
Platelets: 203 10*3/uL (ref 150–400)
RDW: 19.7 % — ABNORMAL HIGH (ref 11.5–15.5)

## 2011-09-07 LAB — GLUCOSE, CAPILLARY
Glucose-Capillary: 159 mg/dL — ABNORMAL HIGH (ref 70–99)
Glucose-Capillary: 178 mg/dL — ABNORMAL HIGH (ref 70–99)

## 2011-09-07 MED ORDER — INSULIN ASPART 100 UNIT/ML ~~LOC~~ SOLN
5.0000 [IU] | Freq: Two times a day (BID) | SUBCUTANEOUS | Status: DC
  Administered 2011-09-08 – 2011-09-13 (×8): 5 [IU] via SUBCUTANEOUS

## 2011-09-07 MED ORDER — FUROSEMIDE 10 MG/ML IJ SOLN
20.0000 mg | Freq: Once | INTRAMUSCULAR | Status: AC
  Administered 2011-09-07: 20 mg via INTRAVENOUS
  Filled 2011-09-07: qty 2

## 2011-09-07 MED ORDER — GLUCOSE 40 % PO GEL
ORAL | Status: AC
  Administered 2011-09-07: 37.5 g via ORAL
  Filled 2011-09-07: qty 1

## 2011-09-07 MED ORDER — GLUCOSE 40 % PO GEL
1.0000 | ORAL | Status: DC | PRN
  Administered 2011-09-07: 37.5 g via ORAL

## 2011-09-07 MED ORDER — ACETAMINOPHEN 325 MG PO TABS
650.0000 mg | ORAL_TABLET | Freq: Once | ORAL | Status: AC
  Administered 2011-09-07: 650 mg via ORAL

## 2011-09-07 MED ORDER — DIPHENHYDRAMINE HCL 25 MG PO CAPS
25.0000 mg | ORAL_CAPSULE | Freq: Once | ORAL | Status: AC
  Administered 2011-09-07: 25 mg via ORAL
  Filled 2011-09-07: qty 1

## 2011-09-07 NOTE — Progress Notes (Signed)
Speech Language Pathology Daily Session Note  Patient Details  Name: Cindy Abbott MRN: [id number] Date of Birth: 10/01/1922  Today's Date: 09/07/2011 Time: 1200-1230 Time Calculation (min): 30 min  Short Term Goals: Week 2: SLP Short Term Goal 1 (Week 2): Patient will consume Dys.2 textures and thin liquids with mod assist to utilize compensatory strategies to prevent overt s/s of aspiraiton  SLP Short Term Goal 2 (Week 2): Patient will consume trials of Dys.3 textures with mod assist verbal cues for efficient mastication and to monitor oral residue. SLP Short Term Goal 3 (Week 2): Patient will attend to left upper extremity with mod assist semantic cues during functional activities.  SLP Short Term Goal 4 (Week 2): Patient will attend to left of environment with mod assist semantic cues during functional tasks. SLP Short Term Goal 5 (Week 2): Patient will utilize external aids for orientation with mod assist semantic cues.  Skilled Therapeutic Interventions: Co-treatment with OT.  Treatment session focused on addressing dysphagia goals; SLP facilitated session with increased wait time and supervision level semantic cues to take small sips, utilize 2 swallows and intermittent throat clears.  Patient displayed no overt s/s of aspiration throughout session.     FIM:  Comprehension Comprehension Mode: Auditory Comprehension: 4-Understands basic 75 - 89% of the time/requires cueing 10 - 24% of the time Expression Expression Mode: Verbal Expression: 4-Expresses basic 75 - 89% of the time/requires cueing 10 - 24% of the time. Needs helper to occlude trach/needs to repeat words. Social Interaction Social Interaction: 6-Interacts appropriately with others with medication or extra time (anti-anxiety, antidepressant). Problem Solving Problem Solving: 2-Solves basic 25 - 49% of the time - needs direction more than half the time to initiate, plan or complete simple activities Memory Memory: 2-Recognizes  or recalls 25 - 49% of the time/requires cueing 51 - 75% of the time FIM - Eating Eating Activity: 5: Supervision/cues;5: Set-up assist for open containers  Jackalyn Lombard, Conrad Frankford  Graduate Clinician Speech Language Pathology  Therapy/Group: Group Therapy  Page, Joni Reining 09/07/2011, 1:20 PM  The above skilled treatment note has been reviewed and SLP is in agreement. Fae Pippin, M.A., CCC-SLP [phone]

## 2011-09-07 NOTE — Significant Event (Signed)
Hypoglycemic Event  CBG: 66  Treatment: 15 GM carbohydrate snack and 15 GM gel  Symptoms: None  Follow-up CBG: Time:1803 CBG Result:71  Possible Reasons for Event: Unknown  Comments/MD notified:CBG stabilized, patient asymptomatic    Taft Worthing, Phill Mutter  Remember to initiate Hypoglycemia Order Set & complete

## 2011-09-07 NOTE — Progress Notes (Signed)
Occupational Therapy Weekly Progress Note and Treatment Session  Patient Details  Name: Cindy Abbott MRN: [id number] Date of Birth: Dec 28, 1922  Today's Date: 09/07/2011 Time: 1000-1100 Time Calculation (min): 60 min  Patient is showing slow but steady progression towards STGs and LTGs, meeting 0 of 4 STGs.  Pt continues to require min-mod cues for initiation, sequencing, and memory recall.  Pt min-mod physical assist with transfers and sit <> stand to complete self-care tasks. Pt's COPD and asthma quickly fatigue pt during self-care tasks and require increased time and rest breaks throughout session.  Patient continues to demonstrate the following deficits: Lt hemiplegia, decreased activity tolerance and endurance, impaired memory, decreased sustained attention, impaired sequencing and initiation, decreased postural control, impaired dynamic sitting and standing balance, and therefore will continue to benefit from skilled OT intervention to enhance overall performance with BADL and Reduce care partner burden.  Patient progressing toward long term goals..  Continue plan of care.  OT Short Term Goals Week 3:  OT Short Term Goal 1 (Week 3): Pt will complete UB dressing with setup and min cues for sequencing OT Short Term Goal 2 (Week 3): Pt will complete LB dressing with min/steadying assist OT Short Term Goal 3 (Week 3): Pt will complete toilet transfer with min assist with LRAD OT Short Term Goal 4 (Week 3): Pt will complete grooming with setup assist with LUE at diminished level.  Skilled Therapeutic Interventions/Progress Updates:    Pt seen for ADL retraining with focus on functional transfers with self-care tasks, increased initiation and sequencing with bathing at shower level, dynamic sitting and standing balance, and use of Lt hand as gross assist with self-care tasks.  Pt completed bathing at walk-in shower level with min assist transfer with use of grab bars and tub bench for safety and  energy conservation.  Pt required mod question cues with initiation and sequencing throughout self-care tasks.  Pt required frequent rest breaks secondary to SOB with activity.  Therapy Documentation Precautions:  Precautions Precautions: Fall Precaution Comments: Dys 1, Nectar thick liquids, TEDs donned OOB Restrictions Weight Bearing Restrictions: No Pain: Pain Assessment Pain Assessment: No/denies pain ADL: ADL Grooming: Minimal assistance Where Assessed-Grooming: Sitting at sink;Standing at sink Upper Body Bathing: Minimal cueing;Supervision/safety Where Assessed-Upper Body Bathing: Shower Lower Body Bathing: Minimal cueing;Minimal assistance Where Assessed-Lower Body Bathing: Shower Upper Body Dressing: Minimal assistance Where Assessed-Upper Body Dressing: Sitting at sink Lower Body Dressing: Minimal cueing;Minimal assistance Where Assessed-Lower Body Dressing: Sitting at sink;Standing at sink Toileting: Minimal assistance Where Assessed-Toileting: Teacher, adult education: Curator Method: Surveyor, minerals: Bedside commode;Grab bars Film/video editor: Insurance underwriter Method: Warden/ranger: Transfer tub bench;Grab bars ADL Comments: Pt requires cues for intiation and sequencing. Pt with occasional Lt inattention, and is able to report frustrations with LUE functioning. Pt with h/o COPD and asthma, requires multiple rest breaks  See FIM for current functional status  Therapy/Group: Individual Therapy  [name] 09/07/2011, 12:28 PM

## 2011-09-07 NOTE — Progress Notes (Signed)
Physical Therapy Session Note  Patient Details  Name: Cindy Abbott MRN: [id number] Date of Birth: 03-28-1922  Today's Date: 09/07/2011 Time: 1478-2956 Time Calculation (min): 57 min  Short Term Goals: Week 2:  PT Short Term Goal 1 (Week 2): Patient will perform gait x 25' with max A and LRAD in controlled environment  PT Short Term Goal 1 - Progress (Week 2): Partly met PT Short Term Goal 2 (Week 2): Patient will perform standing balance and pre gait training with max A x 5 minutes  PT Short Term Goal 2 - Progress (Week 2): Met PT Short Term Goal 3 (Week 2): Patient will perform bed <> w/c transfers to L and R with mod A PT Short Term Goal 3 - Progress (Week 2): Met PT Short Term Goal 4 (Week 2): Patient will perform bed mobility on flat bed to L and R with min A PT Short Term Goal 4 - Progress (Week 2): Met Week 3:  PT Short Term Goal 1 (Week 3): = LTG  Skilled Therapeutic Interventions/Progress Updates:   Patient receiving blood transfusion but RN cleared patient for therapy while receiving transfusion.  Patient reporting a need to use the toilet; performed stand pivot w/c <> toilet with grab bars and min-mod A; static standing with UE support on grab bar for clothing adjustment and hygiene with min A to stand; at end of session performed stand pivot w/c > bed with mod A and sit > supine on flat bed with mod A to bring bilat LE into bed and reposition in bed.   Gait training:  Performed lateral weight shift training in standing with bilat UE support on rails during 10 reps alternating foot taps to 4" step with cues to maintain LLE extensor activation in stance during RLE toe tap and cues for full LLE clearance.  Performed stair negotiation up and down 3 steps with 2 rails and mod A for weight shifting, full LLE advancement and safe sequence.    Therapy Documentation Precautions:  Precautions Precautions: Fall Precaution Comments: Dys 1, Nectar thick liquids, TEDs donned  OOB Restrictions Weight Bearing Restrictions: No Vital Signs: Therapy Vitals Temp: 97.9 F (36.6 C) Temp src: Oral Pulse Rate: 60  Resp: 18  BP: 136/73 mmHg Pain: Pain Assessment Pain Assessment: No/denies pain Pain Score: 0-No pain  See FIM for current functional status  Therapy/Group: Individual Therapy  Edman Circle Faucette 09/07/2011, 2:22 PM

## 2011-09-07 NOTE — Progress Notes (Signed)
Occupational Therapy Session Note  Patient Details  Name: Sharlyn Odonnel MRN: [id number] Date of Birth: May 23, 1922  Today's Date: 09/07/2011 Time: 1140-1200 Time Calculation (min): 20 min   Skilled Therapeutic Interventions/Progress Updates:    [employer]: focus on using left UE as gross assist to active assist with self feeding, managing safe swallowing strategies with min- mod cuing, activity tolerance  Therapy Documentation Precautions:  Precautions Precautions: Fall Precaution Comments: Dys 1, Nectar thick liquids, TEDs donned OOB Restrictions Weight Bearing Restrictions: No Pain: Pain Assessment Pain Assessment: No/denies pain Pain Score: 0-No pain  See FIM for current functional status  Therapy/Group: Group Therapy  Roney Mans [facility] 09/07/2011, 3:32 PM

## 2011-09-07 NOTE — Progress Notes (Signed)
Patient received 2 units of blood today, tolerated well with no visible complications. Hypoglycemic episode resolved after intervening with glucose gel and eating meal tray. Patient was asymptomatic with each episode. Continent BM x 1 with therapy this afternoon. Continue with plan of care.

## 2011-09-07 NOTE — Significant Event (Signed)
Hypoglycemic Event  CBG: 53  Treatment: 15 GM carbohydrate snack  Symptoms: None  Follow-up CBG: Time:1726 CBG Result:66  Possible Reasons for Event: Unknown  Comments/MD notified:Protocol initiated and followed a second time    Alecia Doi, Phill Mutter  Remember to initiate Hypoglycemia Order Set & complete

## 2011-09-07 NOTE — Consult Note (Signed)
Referring Provider: No ref. provider found Primary Care Physician:  No primary provider on file. Primary Gastroenterologist:  Physician at [facility]  Reason for Consultation:  Anemia  HPI: Cindy Abbott is a 78 y.o. 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  She did receive TPA for this and now is being maintained on a full dose ASA.  While she was in the rehab unit she was noted to have a drop in her Hgb from 9.5 grams on 8/20 to 7.0 grams today, 9/3; therefore, GI was consulted.  Per nursing staff she has not moved her bowels today, but there have not been any reports of dark or bloody stools.  Patient is confused and offers little information, but denies abdominal pain and chest pain.  Gets SOB after even minimal exertion.  Denies any complaints of heartburn, reflux, indigestion, etc.  I called and spoke with her son, Lawerence, who has been caring for his mother for quite some time.  He says that she follows with a GI physician at [facility]  Reports that in 2008 she had 9 polyps removed and there was a large one that could not be removed completely without surgical resection, which she opted against.  She was then going for a colonoscopy yearly until 2011 to monitor that polyp.  Her son reports that she does get a "slow blood loss" from this polyp; in fact, she was on coumadin in the past, but this was stopped due to the bleeding.  Now, her Hgb was being monitored regularly and the coumadin was stopped, which allowed her to remain stable.  He reports that she has never undergone EGD because she never had any issues with her stomach, etc.   Past Medical History  Diagnosis Date  . Coronary artery disease   . Diabetes mellitus   . Pacemaker   . Asthma   . Stroke, acute, thrombotic 08/19/2011    Past Surgical History  Procedure Date  . Coronary angioplasty with stent  placement   . Coronary artery bypass graft   . Abdominal hysterectomy   . Colon polyps removed at [facility]     Prior to Admission medications   Medication Sig Start Date End Date Taking? Authorizing Provider  aspirin EC 81 MG tablet Take 81 mg by mouth every morning.   Yes Historical Provider, MD  bumetanide (BUMEX) 1 MG tablet Take 1 mg by mouth daily. *Only take if fluid retention is current. Provided via son discretion. Patient is to take Klor-Con with this medication*   Yes Historical Provider, MD  cloNIDine (CATAPRES - DOSED IN MG/24 HR) 0.2 mg/24hr patch Place 1 patch onto the skin once a week.   Yes Historical Provider, MD  cloNIDine (CATAPRES - DOSED IN MG/24 HR) 0.2 mg/24hr patch Place 1 patch (0.2 mg total) onto the skin once a week. 08/23/11 08/22/12 Yes Micki Riley, MD  insulin lispro (HUMALOG) 100 UNIT/ML injection Inject 5-10 Units into the skin 3 (three) times daily before meals. *Inject 5 units in the morning and 10 units at lunch and at dinner*   Yes Historical Provider, MD  isosorbide mononitrate (IMDUR) 30 MG 24 hr tablet Take 30 mg by mouth daily.   Yes Historical Provider, MD  metoprolol (LOPRESSOR) 50 MG tablet Take 50 mg by mouth 2 (two) times daily.   Yes Historical Provider, MD  potassium chloride SA (K-DUR,KLOR-CON) 20  MEQ tablet Take 20 mEq by mouth daily. *To be taken when Bumex is taken for fluid retention*   Yes Historical Provider, MD    Current Facility-Administered Medications  Medication Dose Route Frequency Provider Last Rate Last Dose  . acetaminophen (TYLENOL) tablet 325-650 mg  325-650 mg Oral Q4H PRN Mcarthur Rossetti Angiulli, PA      . acetaminophen (TYLENOL) tablet 650 mg  650 mg Oral Once Pamela S Love, PA      . ipratropium (ATROVENT) nebulizer solution 0.5 mg  0.5 mg Nebulization BID Mcarthur Rossetti Angiulli, PA   0.5 mg at 09/06/11 2130   And  . albuterol (PROVENTIL) (5 MG/ML) 0.5% nebulizer solution 2.5 mg  2.5 mg Nebulization BID Mcarthur Rossetti Angiulli, PA   2.5  mg at 09/06/11 2130  . antiseptic oral rinse (BIOTENE) solution 15 mL  15 mL Mouth Rinse QID Mcarthur Rossetti Angiulli, PA   15 mL at 09/06/11 2021  . aspirin tablet 325 mg  325 mg Oral Daily Mcarthur Rossetti Angiulli, PA   325 mg at 09/07/11 0739  . cloNIDine (CATAPRES - Dosed in mg/24 hr) patch 0.2 mg  0.2 mg Transdermal Weekly Mcarthur Rossetti Angiulli, PA   0.2 mg at 09/07/11 0609  . diphenhydrAMINE (BENADRYL) capsule 25 mg  25 mg Oral Once Pamela S Love, PA      . feeding supplement (ENSURE) pudding 1 Container  1 Container Oral TID BM Haynes Bast, RD   1 Container at 09/06/11 1438  . feeding supplement (GLUCERNA SHAKE) liquid 237 mL  237 mL Oral Q24H Haynes Bast, RD   237 mL at 09/05/11 1753  . furosemide (LASIX) injection 20 mg  20 mg Intravenous Once Pamela S Love, PA      . hydrocerin (EUCERIN) cream   Topical BID Ranelle Oyster, MD      . insulin aspart (novoLOG) injection 10 Units  10 Units Subcutaneous BID AC Ranelle Oyster, MD   5 Units at 09/06/11 1243  . insulin aspart (novoLOG) injection 5 Units  5 Units Subcutaneous QAC breakfast Charlton Amor, PA   5 Units at 09/07/11 0744  . insulin glargine (LANTUS) injection 10 Units  10 Units Subcutaneous QHS Charlton Amor, PA   10 Units at 09/06/11 2150  . isosorbide mononitrate (IMDUR) 24 hr tablet 30 mg  30 mg Oral Daily Mcarthur Rossetti Angiulli, PA   30 mg at 09/07/11 0739  . latanoprost (XALATAN) 0.005 % ophthalmic solution 1 drop  1 drop Both Eyes QHS Mcarthur Rossetti Angiulli, PA   1 drop at 09/06/11 2151  . lidocaine (XYLOCAINE) 2 % jelly   Topical PRN Erick Colace, MD      . metoprolol tartrate (LOPRESSOR) tablet 25 mg  25 mg Oral BID Ranelle Oyster, MD   25 mg at 09/07/11 0739  . ondansetron (ZOFRAN) tablet 4 mg  4 mg Oral Q6H PRN Mcarthur Rossetti Angiulli, PA       Or  . ondansetron (ZOFRAN) injection 4 mg  4 mg Intravenous Q6H PRN Mcarthur Rossetti Angiulli, PA      . pantoprazole (PROTONIX) EC tablet 40 mg  40 mg Oral Q breakfast Mcarthur Rossetti Angiulli, PA    40 mg at 09/07/11 0739  . polyethylene glycol (MIRALAX / GLYCOLAX) packet 17 g  17 g Oral Daily PRN Mcarthur Rossetti Angiulli, PA      . sorbitol 70 % solution 30 mL  30 mL Oral Daily PRN Mcarthur Rossetti  Angiulli, PA   30 mL at 09/05/11 1944    Allergies as of 08/23/2011 - Review Complete 08/23/2011  Allergen Reaction Noted  . Lisinopril  08/18/2011    No family history on file.  History   Social History  . Marital Status: Widowed    Spouse Name: N/A    Number of Children: N/A  . Years of Education: N/A   Occupational History  . Not on file.   Social History Main Topics  . Smoking status: Never Smoker   . Smokeless tobacco: Not on file  . Alcohol Use: No  . Drug Use: No  . Sexually Active: Not on file     hysterectomy   Other Topics Concern  . Not on file   Social History Narrative  . No narrative on file    Review of Systems: Ten point ROS O/W negative except as mentioned in HPI.  Physical Exam: Vital signs in last 24 hours: Temp:  [97.6 F (36.4 C)-98.5 F (36.9 C)] 98.5 F (36.9 C) (09/03 0500) Pulse Rate:  [64-70] 64  (09/03 0500) Resp:  [18] 18  (09/03 0500) BP: (128-146)/(70-80) 146/70 mmHg (09/03 0500) SpO2:  [99 %-100 %] 99 % (09/03 0500) Last BM Date: 09/05/11 General:   Alert, Well-developed, well-nourished, pleasant and cooperative in NAD; appears stated age Head:  Normocephalic and atraumatic. Eyes:  Sclera clear, no icterus. Conjunctiva pale. Ears:  Normal auditory acuity. Mouth:  No deformity or lesions.   Lungs:  Clear throughout to auscultation.  No wheezes, crackles, or rhonchi. Heart:  Regular rate and rhythm; no murmurs, clicks, rubs, or gallops. Abdomen:  Soft, non-distended, nontender, BS active,nonpalp mass or hsm.   Rectal:  Deferred  Msk:  Symmetrical without gross deformities. 5/5 RUE strength, 4/5 LUE strength Pulses:  Normal pulses noted. Extremities:  Without clubbing or edema. Neurologic:  Alert, confused; left side hemiparesis Skin:   Intact without significant lesions or rashes.. Psych:  Alert and cooperative. Normal mood and affect.  Intake/Output from previous day: 09/02 0701 - 09/03 0700 In: 840 [P.O.:840] Out: -  Intake/Output this shift: Total I/O In: 360 [P.O.:360] Out: -   Lab Results:  Basename 09/07/11 0625  WBC 5.5  HGB 7.0*  HCT 21.6*  PLT 203   IMPRESSION:  -Anemia-microcytic.  Likely multifactorial with chronic slow GIB a contributing factor -History of colon polyps (one large polyp that was not able to be completely removed and bleeds slowly on occasion) per her son's report -Recent acute thrombotic CVA with TPA therapy. Now with left hemiparesis -CAD-s/p stent placements, pacemaker, and CABG in the past -DM  PLAN: -Agree with blood transfusion.  Monitor Hgb. -? Conservative treatment for now.  With no sign of active bleeding, she can likely follow-up with her GI physician at [facility] once she recovers from this CVA.   ZEHR, JESSICA D.  09/07/2011, 10:31 AM  Pager number [phone]      ________________________________________________________________________  [facility] MD note:  I personally examined the patient, reviewed the data and agree with the assessment and plan described above.  Her Gi bleeding is well documented, chronic from large colon polyp that has been followed at [facility].  Would simply transfuse her as needed for anemia and would not proceed with endoscopic evaluation unless she has overt signficant bleeding off blood thinners.   Rob Bunting, MD [facility] Pager [phone]

## 2011-09-07 NOTE — Progress Notes (Signed)
Speech Language Pathology Daily Session Note  Patient Details  Name: Cindy Abbott MRN: [id number] Date of Birth: [birth year]  Today's Date: 09/07/2011 Time: 1100-1130 Time Calculation (min): 30 min  Short Term Goals: Week 2: SLP Short Term Goal 1 (Week 2): Patient will consume Dys.2 textures and thin liquids with mod assist to utilize compensatory strategies to prevent overt s/s of aspiraiton  SLP Short Term Goal 2 (Week 2): Patient will consume trials of Dys.3 textures with mod assist verbal cues for efficient mastication and to monitor oral residue. SLP Short Term Goal 3 (Week 2): Patient will attend to left upper extremity with mod assist semantic cues during functional activities.  SLP Short Term Goal 4 (Week 2): Patient will attend to left of environment with mod assist semantic cues during functional tasks. SLP Short Term Goal 5 (Week 2): Patient will utilize external aids for orientation with mod assist semantic cues.  Skilled Therapeutic Interventions: Treatment session focused on addressing dysphagia and cognition goals; SLP facilitated session by educating patient about her dysphagia as well as with max assist verbal cuing to recall the patient's compensatory strategies for swallowing. SLP also facilitated session with max assist multi modal cues for orientation and recall today.    FIM:  Comprehension Comprehension Mode: Auditory Comprehension: 4-Understands basic 75 - 89% of the time/requires cueing 10 - 24% of the time Expression Expression Mode: Verbal Expression: 4-Expresses basic 75 - 89% of the time/requires cueing 10 - 24% of the time. Needs helper to occlude trach/needs to repeat words. Social Interaction Social Interaction: 6-Interacts appropriately with others with medication or extra time (anti-anxiety, antidepressant). Problem Solving Problem Solving: 2-Solves basic 25 - 49% of the time - needs direction more than half the time to initiate, plan or complete simple  activities Memory Memory: 2-Recognizes or recalls 25 - 49% of the time/requires cueing 51 - 75% of the time FIM - Eating Eating Activity: 5: Supervision/cues;5: Set-up assist for open containers  Therapy/Group: Individual Therapy  Jackalyn Lombard, Conrad Baroda  Graduate Clinician Speech Language Pathology  Page, Joni Reining 09/07/2011, 1:09 PM  The above skilled treatment note has been reviewed and SLP is in agreement. Fae Pippin, M.A., CCC-SLP [phone]

## 2011-09-07 NOTE — Progress Notes (Addendum)
Patient ID: Cindy Abbott, female   DOB: 1922/11/16, 78 y.o.   MRN: [id number]  Subjective/Complaints: Doing well, no dizziness, no CP or SOB Review of Systems  Genitourinary:       Retention  All other systems reviewed and are negative.     Objective: Vital Signs: Blood pressure 146/70, pulse 64, temperature 98.5 F (36.9 C), temperature source Oral, resp. rate 18, height 5\' 5"  (1.651 m), weight 70.2 kg (154 lb 12.2 oz), SpO2 99.00%. No results found. Results for orders placed during the hospital encounter of 08/23/11 (from the past 72 hour(s))  GLUCOSE, CAPILLARY     Status: Abnormal   Collection Time   09/04/11 11:21 AM      Component Value Range Comment   Glucose-Capillary 136 (*) 70 - 99 mg/dL    Comment 1 Notify RN     GLUCOSE, CAPILLARY     Status: Abnormal   Collection Time   09/04/11  4:16 PM      Component Value Range Comment   Glucose-Capillary 101 (*) 70 - 99 mg/dL    Comment 1 Notify RN     GLUCOSE, CAPILLARY     Status: Abnormal   Collection Time   09/04/11  8:38 PM      Component Value Range Comment   Glucose-Capillary 176 (*) 70 - 99 mg/dL    Comment 1 Notify RN     GLUCOSE, CAPILLARY     Status: Abnormal   Collection Time   09/05/11  8:02 AM      Component Value Range Comment   Glucose-Capillary 134 (*) 70 - 99 mg/dL    Comment 1 Notify RN     GLUCOSE, CAPILLARY     Status: Abnormal   Collection Time   09/05/11 11:28 AM      Component Value Range Comment   Glucose-Capillary 144 (*) 70 - 99 mg/dL    Comment 1 Notify RN     GLUCOSE, CAPILLARY     Status: Normal   Collection Time   09/05/11  4:23 PM      Component Value Range Comment   Glucose-Capillary 94  70 - 99 mg/dL    Comment 1 Notify RN     GLUCOSE, CAPILLARY     Status: Abnormal   Collection Time   09/05/11  9:11 PM      Component Value Range Comment   Glucose-Capillary 157 (*) 70 - 99 mg/dL    Comment 1 Notify RN     GLUCOSE, CAPILLARY     Status: Abnormal   Collection Time   09/06/11  7:15 AM   Component Value Range Comment   Glucose-Capillary 119 (*) 70 - 99 mg/dL    Comment 1 Notify RN     GLUCOSE, CAPILLARY     Status: Abnormal   Collection Time   09/06/11 11:39 AM      Component Value Range Comment   Glucose-Capillary 118 (*) 70 - 99 mg/dL    Comment 1 Notify RN     GLUCOSE, CAPILLARY     Status: Normal   Collection Time   09/06/11  4:47 PM      Component Value Range Comment   Glucose-Capillary 73  70 - 99 mg/dL   GLUCOSE, CAPILLARY     Status: Abnormal   Collection Time   09/06/11  9:18 PM      Component Value Range Comment   Glucose-Capillary 196 (*) 70 - 99 mg/dL   CBC     Status:  Abnormal   Collection Time   09/07/11  6:25 AM      Component Value Range Comment   WBC 5.5  4.0 - 10.5 K/uL    RBC 2.79 (*) 3.87 - 5.11 MIL/uL    Hemoglobin 7.0 (*) 12.0 - 15.0 g/dL    HCT 29.5 (*) 62.1 - 46.0 %    MCV 77.4 (*) 78.0 - 100.0 fL    MCH 25.1 (*) 26.0 - 34.0 pg    MCHC 32.4  30.0 - 36.0 g/dL    RDW 30.8 (*) 65.7 - 15.5 %    Platelets 203  150 - 400 K/uL   GLUCOSE, CAPILLARY     Status: Abnormal   Collection Time   09/07/11  7:24 AM      Component Value Range Comment   Glucose-Capillary 164 (*) 70 - 99 mg/dL    Comment 1 Notify RN        HEENT: normal Cardio: RRR Resp: CTA B/L GI: BS positive Extremity:  No Edema Skin:   Intact Neuro: Confused, Flat, Normal Sensory and Abnormal Motor 4/5 on Left side UE and LE Musc/Skel:  Normal Skin is quite dry  Assessment/Plan: 1. Functional deficits secondary to R ICA infarct with L HP, Mild L neglect which require 3+ hours per day of interdisciplinary therapy in a comprehensive inpatient rehab setting. Physiatrist is providing close team supervision and 24 hour management of active medical problems listed below. Physiatrist and rehab team continue to assess barriers to discharge/monitor patient progress toward functional and medical goals. FIM: FIM - Bathing Bathing Steps Patient Completed: Chest;Right Arm;Left  Arm;Abdomen;Front perineal area;Buttocks;Right upper leg;Left upper leg;Left lower leg (including foot);Right lower leg (including foot) Bathing: 4: Steadying assist  FIM - Upper Body Dressing/Undressing Upper body dressing/undressing steps patient completed: Thread/unthread right sleeve of pullover shirt/dresss;Thread/unthread left sleeve of pullover shirt/dress;Put head through opening of pull over shirt/dress;Pull shirt over trunk Upper body dressing/undressing: 4: Min-Patient completed 75 plus % of tasks FIM - Lower Body Dressing/Undressing Lower body dressing/undressing steps patient completed: Thread/unthread right underwear leg;Thread/unthread left underwear leg;Pull underwear up/down;Thread/unthread right pants leg;Thread/unthread left pants leg;Pull pants up/down Lower body dressing/undressing: 4: Min-Patient completed 75 plus % of tasks  FIM - Toileting Toileting steps completed by patient: Adjust clothing prior to toileting;Performs perineal hygiene Toileting Assistive Devices: Grab bar or rail for support Toileting: 4: Steadying assist  FIM - Diplomatic Services operational officer Devices: Grab bars Toilet Transfers: 4-To toilet/BSC: Min A (steadying Pt. > 75%);4-From toilet/BSC: Min A (steadying Pt. > 75%)  FIM - Bed/Chair Transfer Bed/Chair Transfer Assistive Devices: Bed rails;Arm rests;Walker Bed/Chair Transfer: 3: Chair or W/C > Bed: Mod A (lift or lower assist)  FIM - Locomotion: Wheelchair Distance: 150 Locomotion: Wheelchair: 1: Total Assistance/staff pushes wheelchair (Pt<25%) FIM - Locomotion: Ambulation Locomotion: Ambulation Assistive Devices: Parallel bars Ambulation/Gait Assistance: 3: Mod assist Locomotion: Ambulation: 1: Travels less than 50 ft with moderate assistance (Pt: 50 - 74%)  Comprehension Comprehension Mode: Auditory Comprehension: 4-Understands basic 75 - 89% of the time/requires cueing 10 - 24% of the time  Expression Expression Mode:  Verbal Expression: 4-Expresses basic 75 - 89% of the time/requires cueing 10 - 24% of the time. Needs helper to occlude trach/needs to repeat words.  Social Interaction Social Interaction: 5-Interacts appropriately 90% of the time - Needs monitoring or encouragement for participation or interaction.  Problem Solving Problem Solving: 2-Solves basic 25 - 49% of the time - needs direction more than half the time to initiate,  plan or complete simple activities  Memory Memory: 2-Recognizes or recalls 25 - 49% of the time/requires cueing 51 - 75% of the time  Medical Problem List and Plan:  1. 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 DVT Prophylaxis/Anticoagulation: SCDs. Monitor for signs of DVT. Will consider Lovenox therapy  3. Dysphagia. Dysphagia 1 diet with thin liquids as per modified barium swallow 08/23/2011. Monitor for any signs of aspiration. Followup speech therapy. . Monitor hydration  . Latest creatinine 1.18 4. Neuropsych: This patient is not capable of making decisions on her own behalf.  5. Diabetes mellitus. Controlled Hemoglobin A1c of 8.2. Lantus insulin 10 units each bedtime as well as NovoLog 10 units before lunch and supper and 5 units before breakfast . Hold parameters added to 10 unit dose to help avoid hypoglycemia. Generally her am cbg's have been better. Follow for now 6. Hypertension. Clonidine patch 0.2 mg weekly,.imdur 30 mg daily, Lopressor 25 mg twice a day. Monitor the increased mobility . Patient denies any dizziness when up for therapies. No chest pain or shortness of breath 7. Escherichia coli UTI. resolved 8. CAD status post pacemaker. Patient denies any chest pain. Continue aspirin therapy 9.  Anemia f/u CBC last Hgb was 9.5 on 8/20, down to 7.0 transfuse, ask GI to eval  LOS (Days) 15 A FACE TO FACE EVALUATION WAS PERFORMED  KIRSTEINS,ANDREW E 09/07/2011, 10:07 AM

## 2011-09-08 ENCOUNTER — Inpatient Hospital Stay (HOSPITAL_COMMUNITY): Payer: Medicare Other | Admitting: Occupational Therapy

## 2011-09-08 ENCOUNTER — Inpatient Hospital Stay (HOSPITAL_COMMUNITY): Payer: Medicare Other | Admitting: Speech Pathology

## 2011-09-08 ENCOUNTER — Inpatient Hospital Stay (HOSPITAL_COMMUNITY): Payer: Medicare Other | Admitting: Physical Therapy

## 2011-09-08 LAB — CBC
Hemoglobin: 9.4 g/dL — ABNORMAL LOW (ref 12.0–15.0)
MCH: 26.2 pg (ref 26.0–34.0)
MCHC: 32.8 g/dL (ref 30.0–36.0)
MCV: 79.9 fL (ref 78.0–100.0)
Platelets: 193 10*3/uL (ref 150–400)
RBC: 3.59 MIL/uL — ABNORMAL LOW (ref 3.87–5.11)

## 2011-09-08 LAB — TYPE AND SCREEN: Unit division: 0

## 2011-09-08 LAB — GLUCOSE, CAPILLARY
Glucose-Capillary: 113 mg/dL — ABNORMAL HIGH (ref 70–99)
Glucose-Capillary: 87 mg/dL (ref 70–99)

## 2011-09-08 MED ORDER — ENSURE PUDDING PO PUDG
1.0000 | Freq: Two times a day (BID) | ORAL | Status: DC
  Administered 2011-09-08 – 2011-09-14 (×10): 1 via ORAL

## 2011-09-08 MED ORDER — ALBUTEROL SULFATE (5 MG/ML) 0.5% IN NEBU
2.5000 mg | INHALATION_SOLUTION | RESPIRATORY_TRACT | Status: DC | PRN
  Administered 2011-09-08: 2.5 mg via RESPIRATORY_TRACT

## 2011-09-08 MED ORDER — ALBUTEROL SULFATE (5 MG/ML) 0.5% IN NEBU
INHALATION_SOLUTION | RESPIRATORY_TRACT | Status: AC
  Filled 2011-09-08: qty 0.5

## 2011-09-08 MED ORDER — IPRATROPIUM BROMIDE 0.02 % IN SOLN
RESPIRATORY_TRACT | Status: AC
  Filled 2011-09-08: qty 2.5

## 2011-09-08 MED ORDER — IPRATROPIUM BROMIDE 0.02 % IN SOLN
0.5000 mg | RESPIRATORY_TRACT | Status: DC | PRN
  Administered 2011-09-08: 0.5 mg via RESPIRATORY_TRACT

## 2011-09-08 MED ORDER — ASPIRIN 81 MG PO CHEW
81.0000 mg | CHEWABLE_TABLET | Freq: Every day | ORAL | Status: DC
  Administered 2011-09-09 – 2011-09-14 (×6): 81 mg via ORAL
  Filled 2011-09-08 (×8): qty 1

## 2011-09-08 NOTE — Significant Event (Signed)
Hypoglycemic Event  CBG: 59  Treatment: 15 gms carbs  Symptoms: none  Follow-up CBG: Time:1700 CBG Result:59  Possible Reasons for Event: unknown  Comments/MD notified:    Chana Bode B  Remember to initiate Hypoglycemia Order Set & complete

## 2011-09-08 NOTE — Plan of Care (Signed)
Problem: RH SKIN INTEGRITY Goal: RH STG SKIN FREE OF INFECTION/BREAKDOWN Skin remains free of infection and skin breakdown with min. Assist.  Outcome: Not Progressing Needs caregiver to monitor skin  Problem: RH KNOWLEDGE DEFICIT Goal: RH STG INCREASE KNOWLEDGE OF DIABETES Patient and/or Caregiver will be able to verbalize medication used to manage diabetes  Outcome: Progressing Son takes care of pt's medical needs

## 2011-09-08 NOTE — Progress Notes (Signed)
Nutrition Follow-up  Intervention:   1. Discontinue Glucerna Shake. Decrease Ensure Pudding to BID between meals. 2. RD to continue to follow nutrition care plan.  Assessment:   Intake has improved, pt eating 75 - 100% of meals and taking supplements as scheduled, per RN. Per SW note, son reported that he is concerned with patient's weight gain and need for supplements. Pt with 9 lb wt gain x 1 week. Pt with +I/O balance.  Pt with hypoglycemic episode yesterday, per chart.  Diet Order:  Dysphagia 2 Supplement: Ensure Pudding TID, Glucerna Shake daily  Meds: Scheduled Meds:    . acetaminophen  650 mg Oral Once  . ipratropium  0.5 mg Nebulization BID   And  . albuterol  2.5 mg Nebulization BID  . antiseptic oral rinse  15 mL Mouth Rinse QID  . aspirin  81 mg Oral Daily  . cloNIDine  0.2 mg Transdermal Weekly  . diphenhydrAMINE  25 mg Oral Once  . feeding supplement  1 Container Oral TID BM  . feeding supplement  237 mL Oral Q24H  . furosemide  20 mg Intravenous Once  . hydrocerin   Topical BID  . insulin aspart  5 Units Subcutaneous QAC breakfast  . insulin aspart  5 Units Subcutaneous BID AC  . insulin glargine  10 Units Subcutaneous QHS  . isosorbide mononitrate  30 mg Oral Daily  . latanoprost  1 drop Both Eyes QHS  . metoprolol  25 mg Oral BID  . pantoprazole  40 mg Oral Q breakfast  . DISCONTD: aspirin  325 mg Oral Daily  . DISCONTD: insulin aspart  10 Units Subcutaneous BID AC   Continuous Infusions:  PRN Meds:.acetaminophen, dextrose, lidocaine, ondansetron (ZOFRAN) IV, ondansetron, polyethylene glycol, sorbitol  Labs:  CMP     Component Value Date/Time   NA 135 08/27/2011 0945   K 4.8 08/27/2011 0945   CL 102 08/27/2011 0945   CO2 23 08/27/2011 0945   GLUCOSE 140* 08/27/2011 0945   BUN 12 08/27/2011 0945   CREATININE 1.28* 08/27/2011 0945   CALCIUM 9.0 08/27/2011 0945   PROT 6.1 08/24/2011 0650   ALBUMIN 2.2* 08/24/2011 0650   AST 19 08/24/2011 0650   ALT 17  08/24/2011 0650   ALKPHOS 99 08/24/2011 0650   BILITOT 0.6 08/24/2011 0650   GFRNONAA 37* 08/27/2011 0945   GFRAA 43* 08/27/2011 0945   CBG (last 3)   Basename 09/07/11 2108 09/07/11 1803 09/07/11 1726  GLUCAP 178* 71 66*      Intake/Output Summary (Last 24 hours) at 09/08/11 1045 Last data filed at 09/08/11 0800  Gross per 24 hour  Intake   1685 ml  Output    500 ml  Net   1185 ml  BM 8/26  Weight Status:  145 lb - wt down 7 lb  Estimated needs:  1450  -1600 kcal, 65 - 75 grams protein  Nutrition Dx:  Inadequate oral intake r/t variable appetite and mental status AEB variable intake - resolved.  Goal: Pt to meet >/= 90% of their estimated nutrition needs; met  Monitor:  Weights, labs, PO intake, supplement tolerance  Jarold Motto MS, RD, LDN Pager: [phone] After-hours pager: [phone]

## 2011-09-08 NOTE — Significant Event (Signed)
Hypoglycemic Event  CBG: 59  Treatment: 15 gm carb  Symptoms: none  Follow-up CBG: Time:1715 CBG Result:87  Possible Reasons for Event: unknown  Comments/MD notified:note    Chana Bode B  Remember to initiate Hypoglycemia Order Set & complete

## 2011-09-08 NOTE — Progress Notes (Signed)
Occupational Therapy Session Note  Patient Details  Name: Cindy Abbott MRN: [id number] Date of Birth: 25-Mar-1922  Today's Date: 09/08/2011 Time: 4098-1191 Time Calculation (min): 41 min  Short Term Goals: Week 3:  OT Short Term Goal 1 (Week 3): Pt will complete UB dressing with setup and min cues for sequencing OT Short Term Goal 2 (Week 3): Pt will complete LB dressing with min/steadying assist OT Short Term Goal 3 (Week 3): Pt will complete toilet transfer with min assist with LRAD OT Short Term Goal 4 (Week 3): Pt will complete grooming with setup assist with LUE at diminished level.  Skilled Therapeutic Interventions/Progress Updates:    Pt seen for ADL retraining at sink per pt's request.  Focus on increased initiation and sequencing with self-care task of bathing and dressing.  Pt required min-mod cues for initiation and thoroughness with bathing.  Pt required multiple rest breaks throughout session secondary to wheezing and SOB.  Pt completed LB dressing at sit to stand level with min/steady assist in standing for upright posture.  Pt progressing to light min assist sit <> stand with increased carryover of hand placement.  Increased use of LUE with bathing and dressing with min cues to incorporate it in self-care tasks.  Therapy Documentation Precautions:  Precautions Precautions: Fall Precaution Comments: Dys 1, Nectar thick liquids, TEDs donned OOB Restrictions Weight Bearing Restrictions: No Pain:   Pt with no c/o pain this session.  See FIM for current functional status  Therapy/Group: Individual Therapy  [name] 09/08/2011, 10:23 AM

## 2011-09-08 NOTE — Progress Notes (Signed)
Speech Language Pathology Daily Session Note  Patient Details  Name: Cindy Abbott MRN: [id number] Date of Birth: 21-Sep-1922  Today's Date: 09/08/2011 Time: 300-315 Time Calculation (min): 15 min  Short Term Goals: Week 2: SLP Short Term Goal 1 (Week 2): Patient will consume Dys.2 textures and thin liquids with mod assist to utilize compensatory strategies to prevent overt s/s of aspiraiton  SLP Short Term Goal 2 (Week 2): Patient will consume trials of Dys.3 textures with mod assist verbal cues for efficient mastication and to monitor oral residue. SLP Short Term Goal 3 (Week 2): Patient will attend to left upper extremity with mod assist semantic cues during functional activities.  SLP Short Term Goal 4 (Week 2): Patient will attend to left of environment with mod assist semantic cues during functional tasks. SLP Short Term Goal 5 (Week 2): Patient will utilize external aids for orientation with mod assist semantic cues.  Skilled Therapeutic Interventions: Treatment session focused on addressing cognition goals.  SLP facilitated session with max assist verbal cues for orientation and recall.  Patient continues to demonstrate decreased recall but was able to orient to place with one repetition and increased wait time.     FIM:  Comprehension Comprehension Mode: Auditory Comprehension: 4-Understands basic 75 - 89% of the time/requires cueing 10 - 24% of the time Expression Expression Mode: Verbal Expression: 4-Expresses basic 75 - 89% of the time/requires cueing 10 - 24% of the time. Needs helper to occlude trach/needs to repeat words. Social Interaction Social Interaction: 5-Interacts appropriately 90% of the time - Needs monitoring or encouragement for participation or interaction. Problem Solving Problem Solving: 2-Solves basic 25 - 49% of the time - needs direction more than half the time to initiate, plan or complete simple activities Memory Memory: 2-Recognizes or recalls 25 - 49% of  the time/requires cueing 51 - 75% of the time   Therapy/Group: Individual Therapy  Jackalyn Lombard, [place]  Graduate Clinician Speech Language Pathology  Page, Joni Reining 09/08/2011, 5:04 PM  The above skilled treatment note has been reviewed and SLP is in agreement. Fae Pippin, M.A., CCC-SLP [phone]

## 2011-09-08 NOTE — Progress Notes (Signed)
Physical Therapy Session Note  Patient Details  Name: Cindy Abbott MRN: [id number] Date of Birth: [birth year]  Today's Date: 09/08/2011 Time: 4098-1191 and 4782-9562 Time Calculation (min): 44 min and 42 min  Short Term Goals: Week 3:  PT Short Term Goal 1 (Week 3): = LTG  Skilled Therapeutic Interventions/Progress Updates:   Patient very fatigued after B&D; attempted gait with R HHA x 6' level surface; required mod-max A today secondary to significant fatigue; required tactile and verbal cues for upright posture, full weight shift to R to allow for full step length and LLE clearance.    Performed NMR in standing for LLE forced use, sustained activation, lateral and anterior weight shifting and timing/sequencing during alternating L and R foot tapping to colored targets on floor out of BOS and across midline with mod-max bilat HHA for balance; required multiple sitting rest breaks due to fatigue.  Performed NMR for trunk and postural control sitting on pink balance disc during R and LUE reaching out of BOS for rings with verbal and tactile cues for lumbar trunk elongation/shortening and pelvis depression on R with min-mod A to prevent LOB to L and self correction.  PM session:  Patient just awakened from sleeping.  Performed supine > sit EOB with mod A; attempted gait with bilat HHA x 10' with max A for upright posture, lateral weight shifting and increased step length LLE.  Patient fatigued very quickly; returned to sitting and changed to ADL apartment kitchen.  Attempted lateral weight shifting and LLE extensor activation training in standing while placing food items up, forwards and to the R into tall cabinet with tactile cues for LLE sustained activation; patient again became very SOB and fatigued and required sitting rest break.  Changed to standing in hallway with one UE support on wall rail during soccer ball kicks for lateral weight shifting, single limb stance training and LLE swing training x 10  reps with mod A but again fatigued very quickly and unable to maintain stance on LLE.  Patient returned to room to rest.  Patient demonstrating increased weakness LUE and LLE and increased slurred speech; patient also more SOB but Sp02: 94%.  RN notified of change in status.  Therapy Documentation Precautions:  Precautions Precautions: Fall Precaution Comments: Dys 1, Nectar thick liquids, TEDs donned OOB Restrictions Weight Bearing Restrictions: No Vital Signs: Therapy Vitals Pulse Rate: 63  Oxygen Therapy SpO2: 96 % O2 Device: None (Room air) Pulse Oximetry Type: Intermittent Pain:  No c/o pain  See FIM for current functional status  Therapy/Group: Individual Therapy  Edman Circle Faucette 09/08/2011, 11:00 AM

## 2011-09-08 NOTE — Progress Notes (Signed)
Speech Language Pathology Daily Session Note  Patient Details  Name: Cindy Abbott MRN: [id number] Date of Birth: [birth year]  Today's Date: 09/08/2011 Time: 7829-5621 Time Calculation (min): 65 min  Short Term Goals: Week 2: SLP Short Term Goal 1 (Week 2): Patient will consume Dys.2 textures and thin liquids with mod assist to utilize compensatory strategies to prevent overt s/s of aspiraiton  SLP Short Term Goal 2 (Week 2): Patient will consume trials of Dys.3 textures with mod assist verbal cues for efficient mastication and to monitor oral residue. SLP Short Term Goal 3 (Week 2): Patient will attend to left upper extremity with mod assist semantic cues during functional activities.  SLP Short Term Goal 4 (Week 2): Patient will attend to left of environment with mod assist semantic cues during functional tasks. SLP Short Term Goal 5 (Week 2): Patient will utilize external aids for orientation with mod assist semantic cues.  Skilled Therapeutic Interventions: Treatment session focused on addressing dysphagia goals; SLP facilitated session with increased wait time and supervision level semantic cues to take small sips, utilize 2 swallows and intermittent throat clears.  Patient displayed no overt s/s of aspiration throughout session.  Patient consumed 80% of dys. 2 texture over the course of an hour.  Given length of time to effectively masticate, SLP does not recommend an upgrade to dys. 3 textures at this time.     FIM:  Comprehension Comprehension Mode: Auditory Comprehension: 4-Understands basic 75 - 89% of the time/requires cueing 10 - 24% of the time Expression Expression Mode: Verbal Expression: 4-Expresses basic 75 - 89% of the time/requires cueing 10 - 24% of the time. Needs helper to occlude trach/needs to repeat words. Social Interaction Social Interaction: 5-Interacts appropriately 90% of the time - Needs monitoring or encouragement for participation or interaction. Problem  Solving Problem Solving: 2-Solves basic 25 - 49% of the time - needs direction more than half the time to initiate, plan or complete simple activities Memory Memory: 2-Recognizes or recalls 25 - 49% of the time/requires cueing 51 - 75% of the time FIM - Eating Eating Activity: 5: Supervision/cues;5: Set-up assist for open containers     Therapy/Group: Group Therapy  Jackalyn Lombard, [place] [employer]  Graduate Clinician Speech Language Pathology   Page, Joni Reining 09/08/2011, 4:47 PM  The above skilled treatment note has been reviewed and SLP is in agreement. Fae Pippin, M.A., CCC-SLP [phone]

## 2011-09-08 NOTE — Progress Notes (Signed)
Called to room, PT reported pt appears different today than last couple of days BP 168/73 p60, r 20, pt denies distress, decreased breath sounds at bases but clear, alert, answering questions, may have been slow to repond due to waking from sleep and taken to therapy.  Resp called for breathing treatment since missed this morning (pt refused).  No other change in condition noted. Pamelia Hoit

## 2011-09-08 NOTE — Progress Notes (Signed)
Patient ID: Cindy Abbott, female   DOB: 06-01-1922, 78 y.o.   MRN: [id number]  Subjective/Complaints: Doing well, no dizziness, no CP or SOB Appreciate GI consult Review of Systems  Genitourinary:       Retention  All other systems reviewed and are negative.     Objective: Vital Signs: Blood pressure 127/70, pulse 59, temperature 98.2 F (36.8 C), temperature source Oral, resp. rate 19, height 5\' 5"  (1.651 m), weight 70.2 kg (154 lb 12.2 oz), SpO2 99.00%. No results found. Results for orders placed during the hospital encounter of 08/23/11 (from the past 72 hour(s))  GLUCOSE, CAPILLARY     Status: Abnormal   Collection Time   09/05/11 11:28 AM      Component Value Range Comment   Glucose-Capillary 144 (*) 70 - 99 mg/dL    Comment 1 Notify RN     GLUCOSE, CAPILLARY     Status: Normal   Collection Time   09/05/11  4:23 PM      Component Value Range Comment   Glucose-Capillary 94  70 - 99 mg/dL    Comment 1 Notify RN     GLUCOSE, CAPILLARY     Status: Abnormal   Collection Time   09/05/11  9:11 PM      Component Value Range Comment   Glucose-Capillary 157 (*) 70 - 99 mg/dL    Comment 1 Notify RN     GLUCOSE, CAPILLARY     Status: Abnormal   Collection Time   09/06/11  7:15 AM      Component Value Range Comment   Glucose-Capillary 119 (*) 70 - 99 mg/dL    Comment 1 Notify RN     GLUCOSE, CAPILLARY     Status: Abnormal   Collection Time   09/06/11 11:39 AM      Component Value Range Comment   Glucose-Capillary 118 (*) 70 - 99 mg/dL    Comment 1 Notify RN     GLUCOSE, CAPILLARY     Status: Normal   Collection Time   09/06/11  4:47 PM      Component Value Range Comment   Glucose-Capillary 73  70 - 99 mg/dL   GLUCOSE, CAPILLARY     Status: Abnormal   Collection Time   09/06/11  9:18 PM      Component Value Range Comment   Glucose-Capillary 196 (*) 70 - 99 mg/dL   CBC     Status: Abnormal   Collection Time   09/07/11  6:25 AM      Component Value Range Comment   WBC 5.5  4.0 - 10.5  K/uL    RBC 2.79 (*) 3.87 - 5.11 MIL/uL    Hemoglobin 7.0 (*) 12.0 - 15.0 g/dL    HCT 40.9 (*) 81.1 - 46.0 %    MCV 77.4 (*) 78.0 - 100.0 fL    MCH 25.1 (*) 26.0 - 34.0 pg    MCHC 32.4  30.0 - 36.0 g/dL    RDW 91.4 (*) 78.2 - 15.5 %    Platelets 203  150 - 400 K/uL   GLUCOSE, CAPILLARY     Status: Abnormal   Collection Time   09/07/11  7:24 AM      Component Value Range Comment   Glucose-Capillary 164 (*) 70 - 99 mg/dL    Comment 1 Notify RN     PREPARE RBC (CROSSMATCH)     Status: Normal   Collection Time   09/07/11 11:08 AM  Component Value Range Comment   Order Confirmation ORDER PROCESSED BY BLOOD BANK     TYPE AND SCREEN     Status: Normal   Collection Time   09/07/11 11:08 AM      Component Value Range Comment   ABO/RH(D) O POS      Antibody Screen NEG      Sample Expiration 09/10/2011      Unit Number [id number]      Blood Component Type RBC LR PHER2      Unit division 00      Status of Unit ISSUED,FINAL      Transfusion Status OK TO TRANSFUSE      Crossmatch Result Compatible      Unit Number [id number]      Blood Component Type RBC LR PHER2      Unit division 00      Status of Unit ISSUED,FINAL      Transfusion Status OK TO TRANSFUSE      Crossmatch Result Compatible     ABO/RH     Status: Normal   Collection Time   09/07/11 11:08 AM      Component Value Range Comment   ABO/RH(D) O POS     GLUCOSE, CAPILLARY     Status: Abnormal   Collection Time   09/07/11 11:37 AM      Component Value Range Comment   Glucose-Capillary 159 (*) 70 - 99 mg/dL    Comment 1 Notify RN      Comment 2 Documented in Chart     GLUCOSE, CAPILLARY     Status: Abnormal   Collection Time   09/07/11  4:34 PM      Component Value Range Comment   Glucose-Capillary 53 (*) 70 - 99 mg/dL   GLUCOSE, CAPILLARY     Status: Abnormal   Collection Time   09/07/11  5:26 PM      Component Value Range Comment   Glucose-Capillary 66 (*) 70 - 99 mg/dL    Comment 1 Notify RN     GLUCOSE,  CAPILLARY     Status: Normal   Collection Time   09/07/11  6:03 PM      Component Value Range Comment   Glucose-Capillary 71  70 - 99 mg/dL   GLUCOSE, CAPILLARY     Status: Abnormal   Collection Time   09/07/11  9:08 PM      Component Value Range Comment   Glucose-Capillary 178 (*) 70 - 99 mg/dL   CBC     Status: Abnormal   Collection Time   09/08/11  6:48 AM      Component Value Range Comment   WBC 5.3  4.0 - 10.5 K/uL    RBC 3.59 (*) 3.87 - 5.11 MIL/uL    Hemoglobin 9.4 (*) 12.0 - 15.0 g/dL    HCT 30.8 (*) 65.7 - 46.0 %    MCV 79.9  78.0 - 100.0 fL    MCH 26.2  26.0 - 34.0 pg    MCHC 32.8  30.0 - 36.0 g/dL    RDW 84.6 (*) 96.2 - 15.5 %    Platelets 193  150 - 400 K/uL      HEENT: normal Cardio: RRR Resp: CTA B/L GI: BS positive Extremity:  No Edema Skin:   Intact Neuro: Confused, Flat, Normal Sensory and Abnormal Motor 4/5 on Left side UE and LE Musc/Skel:  Normal Skin is quite dry  Assessment/Plan: 1. Functional deficits secondary to R ICA infarct with  L HP, Mild L neglect which require 3+ hours per day of interdisciplinary therapy in a comprehensive inpatient rehab setting. Physiatrist is providing close team supervision and 24 hour management of active medical problems listed below. Physiatrist and rehab team continue to assess barriers to discharge/monitor patient progress toward functional and medical goals. FIM: FIM - Bathing Bathing Steps Patient Completed: Chest;Right Arm;Left Arm;Abdomen;Front perineal area;Buttocks;Right upper leg;Left upper leg Bathing: 4: Min-Patient completes 8-9 80f 10 parts or 75+ percent  FIM - Upper Body Dressing/Undressing Upper body dressing/undressing steps patient completed: Thread/unthread right bra strap;Thread/unthread left bra strap Upper body dressing/undressing: 3: Mod-Patient completed 50-74% of tasks FIM - Lower Body Dressing/Undressing Lower body dressing/undressing steps patient completed: Thread/unthread right underwear  leg;Thread/unthread left underwear leg;Pull underwear up/down;Thread/unthread right pants leg;Thread/unthread left pants leg;Pull pants up/down Lower body dressing/undressing: 4: Min-Patient completed 75 plus % of tasks  FIM - Toileting Toileting steps completed by patient: Adjust clothing prior to toileting;Performs perineal hygiene Toileting Assistive Devices: Grab bar or rail for support Toileting: 1: Total-Patient completed zero steps, helper did all 3  FIM - Diplomatic Services operational officer Devices: Grab bars Toilet Transfers: 4-To toilet/BSC: Min A (steadying Pt. > 75%);4-From toilet/BSC: Min A (steadying Pt. > 75%)  FIM - Bed/Chair Transfer Bed/Chair Transfer Assistive Devices: Bed rails;Arm rests;Walker Bed/Chair Transfer: 3: Sit > Supine: Mod A (lifting assist/Pt. 50-74%/lift 2 legs);3: Bed > Chair or W/C: Mod A (lift or lower assist);3: Chair or W/C > Bed: Mod A (lift or lower assist)  FIM - Locomotion: Wheelchair Distance: 150 Locomotion: Wheelchair: 1: Total Assistance/staff pushes wheelchair (Pt<25%) FIM - Locomotion: Ambulation Locomotion: Ambulation Assistive Devices: Parallel bars Ambulation/Gait Assistance: 3: Mod assist Locomotion: Ambulation: 0: Activity did not occur  Comprehension Comprehension Mode: Auditory Comprehension: 4-Understands basic 75 - 89% of the time/requires cueing 10 - 24% of the time  Expression Expression Mode: Verbal Expression: 4-Expresses basic 75 - 89% of the time/requires cueing 10 - 24% of the time. Needs helper to occlude trach/needs to repeat words.  Social Interaction Social Interaction: 6-Interacts appropriately with others with medication or extra time (anti-anxiety, antidepressant).  Problem Solving Problem Solving: 2-Solves basic 25 - 49% of the time - needs direction more than half the time to initiate, plan or complete simple activities  Memory Memory: 2-Recognizes or recalls 25 - 49% of the time/requires cueing  51 - 75% of the time  Medical Problem List and Plan:  1. 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 DVT Prophylaxis/Anticoagulation: SCDs. Monitor for signs of DVT. No Lovenox therapy  3. Dysphagia. Dysphagia 1 diet with thin liquids as per modified barium swallow 08/23/2011. Monitor for any signs of aspiration. Followup speech therapy. . Monitor hydration  . Latest creatinine 1.18 4. Neuropsych: This patient is not capable of making decisions on her own behalf.  5. Diabetes mellitus. Controlled Hemoglobin A1c of 8.2. Lantus insulin 10 units each bedtime as well as NovoLog 10 units before lunch and supper and 5 units before breakfast . Hold parameters added to 10 unit dose to help avoid hypoglycemia. Generally her am cbg's have been better. Follow for now 6. Hypertension. Clonidine patch 0.2 mg weekly,.imdur 30 mg daily, Lopressor 25 mg twice a day. Monitor the increased mobility . Patient denies any dizziness when up for therapies. No chest pain or shortness of breath 7. Escherichia coli UTI. resolved 8. CAD status post pacemaker. Patient denies any chest pain. Continue aspirin therapy 9.  Anemia f/u CBC last Hgb was  9.5 on 8/20, down to 7.0 transfused, back to 9.4 on 9/4, monitor CBC, will change to baby ASA  LOS (Days) 16 A FACE TO FACE EVALUATION WAS PERFORMED  KIRSTEINS,ANDREW E 09/08/2011, 9:38 AM

## 2011-09-08 NOTE — Patient Care Conference (Signed)
Inpatient RehabilitationTeam Conference Note Date: 09/08/2011   Time: 10:55 AM    Patient Name: Cindy Abbott      Medical Record Number: [id number]  Date of Birth: 03/10/1922 Sex: Female         Room/Bed: 4147/4147-01 Payor Info: Payor: MEDICARE  Plan: MEDICARE PART A AND B  Product Type: *No Product type*     Admitting Diagnosis: RT CVA  Admit Date/Time:  08/23/2011  4:19 PM Admission Comments: No comment available   Primary Diagnosis:  CVA (cerebral infarction) Principal Problem: CVA (cerebral infarction)  Patient Active Problem List   Diagnosis Date Noted  . CVA (cerebral infarction) 08/24/2011  . UTI (urinary tract infection) 08/18/2011  . Confusion 08/18/2011  . Generalized weakness 08/18/2011  . Renal insufficiency, mild 08/18/2011  . Anemia 08/18/2011  . Hypokalemia 08/18/2011  . Diabetes mellitus 08/18/2011  . S/P CABG x 3 08/18/2011  . CHF, chronic 08/18/2011  . S/P placement of cardiac pacemaker 08/18/2011  . S/P colonoscopy with polypectomy 08/18/2011  . H/O angioedema 08/18/2011  . Coronary artery disease   . Asthma     Expected Discharge Date: Expected Discharge Date: 09/14/11  Team Members Present: Physician: Dr. Claudette Laws Social Worker Present: Dossie Der, LCSW Nurse Present: Chana Bode, RN PT Present: Edman Circle, Judith Blonder, PTA OT Present: [name], Felipa Eth, OT SLP Present: Fae Pippin, SLP Other (Discipline and Name): Charolette Child Coordinator     Current Status/Progress Goal Weekly Team Focus  Medical   Bladder urgency, anemia requiring transfusion do to slow GI bleed  Monitor hemoglobin level  Post discharge planning   Bowel/Bladder   continent of bladder and bowel with toileting  continent of bladder and bowel without cues  encourage pt to voice needs and toilet q 2 wa   Swallow/Nutrition/ Hydration   Dys.2 and thin liquids with full supervision  least restrictive p.o. intake  Dys.3 trials    ADL's   min assist  bathing and dressing, min/steady assist grooming in stnading at sink, min-mod assist transfers  min assist overall, supervision UB dsg and grooming  dynamic sitting and standing balance, sit <> stand, activity tolerance, postural control, family education   Mobility   mod A overall, activity tolerance improving  Min A overall, improved standing tolerance, reactivated home ambulation goal short distances, family education completed  activity tolerance, standing balance/endurance, gait   Communication             Safety/Cognition/ Behavioral Observations  mod assist   min assist  increase left attention, carryover and family education   Pain   n/a  n/a  na/   Skin   n/a  n/a  n/a      *See Interdisciplinary Assessment and Plan and progress notes for long and short-term goals  Barriers to Discharge: Will need physical assistance upon discharge.    Possible Resolutions to Barriers:  Training son    Discharge Planning/Teaching Needs:  Home with son-he began family education on Monday and plans to come back for more education.  Family will provide 24 hour care at discharge      Team Discussion:  Increased attention to left side. Endurance getting somewhat better.  Watch hemoglobin transfused yesterday.  Check CBC prior to discharge. Son to come in for more family education  Revisions to Treatment Plan:  None   Continued Need for Acute Rehabilitation Level of Care: The patient requires daily medical management by a physician with specialized training in physical medicine and rehabilitation for  the following conditions: Daily direction of a multidisciplinary physical rehabilitation program to ensure safe treatment while eliciting the highest outcome that is of practical value to the patient.: Yes Daily medical management of patient stability for increased activity during participation in an intensive rehabilitation regime.: Yes Daily analysis of laboratory values and/or radiology reports  with any subsequent need for medication adjustment of medical intervention for : Neurological problems;Other  Lucy Chris 09/09/2011, 8:47 AM

## 2011-09-09 ENCOUNTER — Inpatient Hospital Stay (HOSPITAL_COMMUNITY): Payer: Medicare Other | Admitting: Occupational Therapy

## 2011-09-09 ENCOUNTER — Inpatient Hospital Stay (HOSPITAL_COMMUNITY): Payer: Medicare Other | Admitting: *Deleted

## 2011-09-09 LAB — GLUCOSE, CAPILLARY
Glucose-Capillary: 140 mg/dL — ABNORMAL HIGH (ref 70–99)
Glucose-Capillary: 82 mg/dL (ref 70–99)
Glucose-Capillary: 230 mg/dL — ABNORMAL HIGH (ref 70–99)

## 2011-09-09 MED ORDER — SENNA 8.6 MG PO TABS
1.0000 | ORAL_TABLET | Freq: Every day | ORAL | Status: DC
  Administered 2011-09-09 – 2011-09-13 (×5): 8.6 mg via ORAL
  Filled 2011-09-09 (×6): qty 1

## 2011-09-09 NOTE — Plan of Care (Signed)
Problem: RH BOWEL ELIMINATION Goal: RH STG MANAGE BOWEL W/MEDICATION W/ASSISTANCE STG Manage Bowel with Medication withminimal Assistance.  Outcome: Progressing Patient starting on 1 senna tab at [facility] due to hard stool

## 2011-09-09 NOTE — Plan of Care (Signed)
Problem: RH SKIN INTEGRITY Goal: RH STG SKIN FREE OF INFECTION/BREAKDOWN Skin remains free of infection and skin breakdown with total assist of caregiver  Outcome: Progressing Staff does now, son will be assisting patient at home per his report

## 2011-09-09 NOTE — Plan of Care (Signed)
Problem: RH KNOWLEDGE DEFICIT Goal: RH STG INCREASE KNOWLEDGE OF DYSPHAGIA/FLUID INTAKE Patient and/or caregiver will verbalize methods to manage dysphagia  Outcome: Not Progressing Patient unable to verbalize, son not present to educate or provide education, pt reports son assist her in everything.

## 2011-09-09 NOTE — Progress Notes (Signed)
Occupational Therapy Session Note  Patient Details  Name: Cindy Abbott MRN: [id number] Date of Birth: [birth year]  Today's Date: 09/09/2011 Time: 1030-1130 Time Calculation (min): 60 min  Short Term Goals: Week 3:  OT Short Term Goal 1 (Week 3): Pt will complete UB dressing with setup and min cues for sequencing OT Short Term Goal 2 (Week 3): Pt will complete LB dressing with min/steadying assist OT Short Term Goal 3 (Week 3): Pt will complete toilet transfer with min assist with LRAD OT Short Term Goal 4 (Week 3): Pt will complete grooming with setup assist with LUE at diminished level.  Skilled Therapeutic Interventions/Progress Updates:    Pt seen for ADL retraining at sink per pt's request. Focus on increased initiation and sequencing with self-care task of bathing, dressing, and grooming tasks. Pt required min-mod cues for initiation and thoroughness with bathing. Occasional setup assist with soap on washcloth secondary to Lt hand weakness.  Pt completed LB dressing at sit to stand level with min/steady assist in standing for upright posture. Pt progressing to light min assist sit <> stand with increased carryover of hand placement on w/c. Increased use of LUE with bathing and dressing with min cues to incorporate it in self-care tasks.  Completed grooming at sink at sit <> stand level with focus on thoroughness with oral hygiene. Pt required multiple rest breaks throughout session secondary to wheezing and SOB.   Therapy Documentation Precautions:  Precautions Precautions: Fall Precaution Comments: Dys 1, Nectar thick liquids, TEDs donned OOB Restrictions Weight Bearing Restrictions: No Pain: Pain Assessment Pain Assessment: 0-10 Pain Score: 0-No pain Patients Stated Pain Goal: 3 Multiple Pain Sites: No  See FIM for current functional status  Therapy/Group: Individual Therapy  [name] 09/09/2011, 12:28 PM

## 2011-09-09 NOTE — Progress Notes (Signed)
Patient ID: Cindy Abbott, female   DOB: 1922-10-24, 78 y.o.   MRN: [id number]  Subjective/Complaints: Doing well, no dizziness, no CP or SOB.  "It's my birthday" Appreciate GI consult Review of Systems  Genitourinary:       Retention  All other systems reviewed and are negative.     Objective: Vital Signs: Blood pressure 137/50, pulse 54, temperature 98.7 F (37.1 C), temperature source Oral, resp. rate 17, height 5\' 5"  (1.651 m), weight 71.6 kg (157 lb 13.6 oz), SpO2 98.00%. No results found. Results for orders placed during the hospital encounter of 08/23/11 (from the past 72 hour(s))  GLUCOSE, CAPILLARY     Status: Abnormal   Collection Time   09/06/11 11:39 AM      Component Value Range Comment   Glucose-Capillary 118 (*) 70 - 99 mg/dL    Comment 1 Notify RN     GLUCOSE, CAPILLARY     Status: Normal   Collection Time   09/06/11  4:47 PM      Component Value Range Comment   Glucose-Capillary 73  70 - 99 mg/dL   GLUCOSE, CAPILLARY     Status: Abnormal   Collection Time   09/06/11  9:18 PM      Component Value Range Comment   Glucose-Capillary 196 (*) 70 - 99 mg/dL   CBC     Status: Abnormal   Collection Time   09/07/11  6:25 AM      Component Value Range Comment   WBC 5.5  4.0 - 10.5 K/uL    RBC 2.79 (*) 3.87 - 5.11 MIL/uL    Hemoglobin 7.0 (*) 12.0 - 15.0 g/dL    HCT 82.9 (*) 56.2 - 46.0 %    MCV 77.4 (*) 78.0 - 100.0 fL    MCH 25.1 (*) 26.0 - 34.0 pg    MCHC 32.4  30.0 - 36.0 g/dL    RDW 13.0 (*) 86.5 - 15.5 %    Platelets 203  150 - 400 K/uL   GLUCOSE, CAPILLARY     Status: Abnormal   Collection Time   09/07/11  7:24 AM      Component Value Range Comment   Glucose-Capillary 164 (*) 70 - 99 mg/dL    Comment 1 Notify RN     PREPARE RBC (CROSSMATCH)     Status: Normal   Collection Time   09/07/11 11:08 AM      Component Value Range Comment   Order Confirmation ORDER PROCESSED BY BLOOD BANK     TYPE AND SCREEN     Status: Normal   Collection Time   09/07/11 11:08 AM   Component Value Range Comment   ABO/RH(D) O POS      Antibody Screen NEG      Sample Expiration 09/10/2011      Unit Number [id number]      Blood Component Type RBC LR PHER2      Unit division 00      Status of Unit ISSUED,FINAL      Transfusion Status OK TO TRANSFUSE      Crossmatch Result Compatible      Unit Number [id number]      Blood Component Type RBC LR PHER2      Unit division 00      Status of Unit ISSUED,FINAL      Transfusion Status OK TO TRANSFUSE      Crossmatch Result Compatible     ABO/RH     Status: Normal  Collection Time   09/07/11 11:08 AM      Component Value Range Comment   ABO/RH(D) O POS     GLUCOSE, CAPILLARY     Status: Abnormal   Collection Time   09/07/11 11:37 AM      Component Value Range Comment   Glucose-Capillary 159 (*) 70 - 99 mg/dL    Comment 1 Notify RN      Comment 2 Documented in Chart     GLUCOSE, CAPILLARY     Status: Abnormal   Collection Time   09/07/11  4:34 PM      Component Value Range Comment   Glucose-Capillary 53 (*) 70 - 99 mg/dL   GLUCOSE, CAPILLARY     Status: Abnormal   Collection Time   09/07/11  5:26 PM      Component Value Range Comment   Glucose-Capillary 66 (*) 70 - 99 mg/dL    Comment 1 Notify RN     GLUCOSE, CAPILLARY     Status: Normal   Collection Time   09/07/11  6:03 PM      Component Value Range Comment   Glucose-Capillary 71  70 - 99 mg/dL   GLUCOSE, CAPILLARY     Status: Abnormal   Collection Time   09/07/11  9:08 PM      Component Value Range Comment   Glucose-Capillary 178 (*) 70 - 99 mg/dL   CBC     Status: Abnormal   Collection Time   09/08/11  6:48 AM      Component Value Range Comment   WBC 5.3  4.0 - 10.5 K/uL    RBC 3.59 (*) 3.87 - 5.11 MIL/uL    Hemoglobin 9.4 (*) 12.0 - 15.0 g/dL    HCT 96.0 (*) 45.4 - 46.0 %    MCV 79.9  78.0 - 100.0 fL    MCH 26.2  26.0 - 34.0 pg    MCHC 32.8  30.0 - 36.0 g/dL    RDW 09.8 (*) 11.9 - 15.5 %    Platelets 193  150 - 400 K/uL   GLUCOSE, CAPILLARY      Status: Abnormal   Collection Time   09/08/11  7:17 AM      Component Value Range Comment   Glucose-Capillary 120 (*) 70 - 99 mg/dL   GLUCOSE, CAPILLARY     Status: Abnormal   Collection Time   09/08/11 11:26 AM      Component Value Range Comment   Glucose-Capillary 113 (*) 70 - 99 mg/dL    Comment 1 Notify RN     GLUCOSE, CAPILLARY     Status: Abnormal   Collection Time   09/08/11  4:43 PM      Component Value Range Comment   Glucose-Capillary 59 (*) 70 - 99 mg/dL    Comment 1 Notify RN     GLUCOSE, CAPILLARY     Status: Abnormal   Collection Time   09/08/11  5:01 PM      Component Value Range Comment   Glucose-Capillary 59 (*) 70 - 99 mg/dL   GLUCOSE, CAPILLARY     Status: Normal   Collection Time   09/08/11  5:18 PM      Component Value Range Comment   Glucose-Capillary 87  70 - 99 mg/dL   GLUCOSE, CAPILLARY     Status: Abnormal   Collection Time   09/08/11  9:04 PM      Component Value Range Comment   Glucose-Capillary 203 (*) 70 -  99 mg/dL    Comment 1 Notify RN     GLUCOSE, CAPILLARY     Status: Abnormal   Collection Time   09/09/11  7:17 AM      Component Value Range Comment   Glucose-Capillary 140 (*) 70 - 99 mg/dL    Comment 1 Notify RN        HEENT: normal Cardio: RRR Resp: CTA B/L GI: BS positive Extremity:  No Edema Skin:   Intact Neuro: Confused, Flat, Normal Sensory and Abnormal Motor 4/5 on Left side UE and LE Musc/Skel:  Normal Skin is quite dry  Assessment/Plan: 1. Functional deficits secondary to R ICA infarct with L HP, Mild L neglect which require 3+ hours per day of interdisciplinary therapy in a comprehensive inpatient rehab setting. Physiatrist is providing close team supervision and 24 hour management of active medical problems listed below. Physiatrist and rehab team continue to assess barriers to discharge/monitor patient progress toward functional and medical goals. FIM: FIM - Bathing Bathing Steps Patient Completed: Chest;Right Arm;Left  Arm;Abdomen;Front perineal area;Buttocks;Right upper leg;Left upper leg;Right lower leg (including foot);Left lower leg (including foot) Bathing: 4: Steadying assist  FIM - Upper Body Dressing/Undressing Upper body dressing/undressing steps patient completed: Thread/unthread right bra strap;Thread/unthread left bra strap;Thread/unthread right sleeve of pullover shirt/dresss;Thread/unthread left sleeve of pullover shirt/dress;Put head through opening of pull over shirt/dress;Pull shirt over trunk Upper body dressing/undressing: 4: Min-Patient completed 75 plus % of tasks FIM - Lower Body Dressing/Undressing Lower body dressing/undressing steps patient completed: Thread/unthread right underwear leg;Thread/unthread left underwear leg;Pull underwear up/down;Thread/unthread right pants leg;Thread/unthread left pants leg;Pull pants up/down;Don/Doff right sock;Don/Doff left sock Lower body dressing/undressing: 4: Steadying Assist  FIM - Toileting Toileting steps completed by patient: Adjust clothing prior to toileting;Performs perineal hygiene Toileting Assistive Devices: Grab bar or rail for support Toileting: 4: Steadying assist  FIM - Diplomatic Services operational officer Devices: Grab bars Toilet Transfers: 4-To toilet/BSC: Min A (steadying Pt. > 75%);4-From toilet/BSC: Min A (steadying Pt. > 75%)  FIM - Bed/Chair Transfer Bed/Chair Transfer Assistive Devices: Bed rails Bed/Chair Transfer: 3: Bed > Chair or W/C: Mod A (lift or lower assist);3: Chair or W/C > Bed: Mod A (lift or lower assist)  FIM - Locomotion: Wheelchair Distance: 150 Locomotion: Wheelchair: 1: Total Assistance/staff pushes wheelchair (Pt<25%) FIM - Locomotion: Ambulation Locomotion: Ambulation Assistive Devices: Parallel bars Ambulation/Gait Assistance: 3: Mod assist Locomotion: Ambulation: 1: Travels less than 50 ft with moderate assistance (Pt: 50 - 74%)  Comprehension Comprehension Mode:  Auditory Comprehension: 4-Understands basic 75 - 89% of the time/requires cueing 10 - 24% of the time  Expression Expression Mode: Verbal Expression: 4-Expresses basic 75 - 89% of the time/requires cueing 10 - 24% of the time. Needs helper to occlude trach/needs to repeat words.  Social Interaction Social Interaction: 3-Interacts appropriately 50 - 74% of the time - May be physically or verbally inappropriate.  Problem Solving Problem Solving: 2-Solves basic 25 - 49% of the time - needs direction more than half the time to initiate, plan or complete simple activities  Memory Memory: 2-Recognizes or recalls 25 - 49% of the time/requires cueing 51 - 75% of the time  Medical Problem List and Plan:  1. 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 DVT Prophylaxis/Anticoagulation: SCDs. Monitor for signs of DVT. No Lovenox therapy  3. Dysphagia. Dysphagia 1 diet with thin liquids as per modified barium swallow 08/23/2011. Monitor for any signs of aspiration. Followup speech therapy. . Monitor hydration  .  Latest creatinine 1.18 4. Neuropsych: This patient is not capable of making decisions on her own behalf.  5. Diabetes mellitus. Controlled Hemoglobin A1c of 8.2. Lantus insulin 10 units each bedtime as well as NovoLog 10 units before lunch and supper and 5 units before breakfast . Hold parameters added to 10 unit dose to help avoid hypoglycemia. Generally her am cbg's have been better. Follow for now 6. Hypertension. Clonidine patch 0.2 mg weekly,.imdur 30 mg daily, Lopressor 25 mg twice a day. Monitor the increased mobility . Patient denies any dizziness when up for therapies. No chest pain or shortness of breath 7. Escherichia coli UTI. resolved 8. CAD status post pacemaker. Patient denies any chest pain. Continue aspirin therapy 9.  Anemia f/u CBC last Hgb was 9.5 on 8/20, down to 7.0 transfused, back to 9.4 on 9/4, monitor CBC, will change to baby ASA  LOS  (Days) 17 A FACE TO FACE EVALUATION WAS PERFORMED  KIRSTEINS,ANDREW E 09/09/2011, 8:16 AM

## 2011-09-09 NOTE — Progress Notes (Signed)
Occupational Therapy Session Note  Patient Details  Name: Akira Perusse MRN: [id number] Date of Birth: June 24, 1922  Today's Date: 09/09/2011 Time: 1400-1430 Time Calculation (min): 30 min  Skilled Therapeutic Interventions/Progress Updates: Patient seen for 1:1 OT to address self care; toilet transfers and toileting.  Neuromuscular reeducation to address trunk extension in sitting and standing.  Patient able to achieve brief bursts of extension, but lacks ability to sustain muscle activity for upright postures.  Retraining to left upper extremity to increase range of motion into end ranges of shoulder flexion and abduction with active trunk extension.  Practiced functional use of bilateral upper extremities during transitional movement, e/g scooting and sit to stand.       Therapy Documentation Precautions:  Precautions Precautions: Fall Precaution Comments: Dys 1, Nectar thick liquids, TEDs donned OOB Restrictions Weight Bearing Restrictions: No    Pain:  No report of pain   See FIM for current functional status  Therapy/Group: Individual Therapy  Collier Salina 09/09/2011, 2:37 PM

## 2011-09-09 NOTE — Progress Notes (Signed)
Occupational Therapy Session Note  Patient Details  Name: Cindy Abbott MRN: [id number] Date of Birth: 10-Jul-1922  Today's Date: 09/09/2011 Time: 1130-1200 Time Calculation (min): 30 min   Skilled Therapeutic Interventions/Progress Updates: Patient participated in [employer] today to address safe performance with self feeding in a social environment.  Today was patient's birthday, and she was so excited to have a birthday cake, and a birthday crown.  She repeated over and over how "blessed" she was to be here with all of her "friends and neighbors."  Patient required frequent cues for safe swallowing techniques as well as to manage food on left side of mouth.       Therapy Documentation Precautions:  Precautions Precautions: Fall Precaution Comments: Dys 1, Nectar thick liquids, TEDs donned OOB Restrictions Weight Bearing Restrictions: No  Pain:  No report of pain   See FIM for current functional status  Therapy/Group: Group Therapy  Cindy Abbott 09/09/2011, 4:17 PM

## 2011-09-09 NOTE — Progress Notes (Signed)
Physical Therapy Session Note  Patient Details  Name: Cindy Abbott MRN: [id number] Date of Birth: 1922/04/06  Today's Date: 09/09/2011 Time: 8:35-9:30 ( )  Skilled Therapeutic Interventions/Progress Updates:  Tx focused on LE strengthening, standing balance during functional activities, and gait training. VSS throught tx.  Pt feeling much better this morning with no c/o pain.   Performed 5x serial sit<>stand from [facility] to table for UE support to reach full upright position. Pt needing Min A to stand and guidance to safely lower with step-by-step cues for safety and sequence. Pt able to put brakes on/off with cues, but continues to need safety cues to reach back for chair to safely lower.   Standing balance hanging and removing laundry on line reaching outside BOS to R/L, encouraged to use bil UE as able. 5 stands x approx 60 sec each with Min-Mod A for balance, esp as LLE fatigues and unable to support pt. Pt needing assistance to safely lower to seat each time with decreased control of descent.   Pt needing multiple seated rest breaks thought tx. Performed seated therex for LE strengthening: LAQ and marching bil x20 each with cues for technique.   Dynamic standing ball kicking with R/L and RUE support on rail, L HHA x20 each side for increased L stance control, coordination, and strengthening.   Gait 1x20' with R handrail in hall with Mod A for steadying and L knee support. Pt responds well momentarily to cues for posture and weight-shift but is unable to maintain.        Therapy Documentation Precautions:  Precautions Precautions: Fall Precaution Comments: Dys 1, Nectar thick liquids, TEDs donned OOB Restrictions Weight Bearing Restrictions: No    See FIM for current functional status  Therapy/Group: Individual Therapy  Virl Cagey, PT 09/09/2011, 9:02 AM

## 2011-09-09 NOTE — Progress Notes (Signed)
Speech Language Pathology Weekly Progress Note  Patient Details  Name: Cindy Abbott MRN: [id number] Date of Birth: 1922-11-29  Today's Date: 09/09/2011  Short Term Goals: Week 2: SLP Short Term Goal 1 (Week 2): Patient will consume Dys.2 textures and thin liquids with mod assist to utilize compensatory strategies to prevent overt s/s of aspiraiton  SLP Short Term Goal 1 - Progress (Week 2): Met SLP Short Term Goal 2 (Week 2): Patient will consume trials of Dys.3 textures with mod assist verbal cues for efficient mastication and to monitor oral residue. SLP Short Term Goal 2 - Progress (Week 2): Not met SLP Short Term Goal 3 (Week 2): Patient will attend to left upper extremity with mod assist semantic cues during functional activities.  SLP Short Term Goal 3 - Progress (Week 2): Met SLP Short Term Goal 4 (Week 2): Patient will attend to left of environment with mod assist semantic cues during functional tasks. SLP Short Term Goal 4 - Progress (Week 2): Met SLP Short Term Goal 5 (Week 2): Patient will utilize external aids for orientation with mod assist semantic cues. SLP Short Term Goal 5 - Progress (Week 2): Met  Weekly Progress Updates: Patient met 4 out of 5 short term objectives this reporting period with gains in dysphagia management, attention to the left environment, upper extremity, and orientation. The patient's orientation continues to fluctuate throughout the day; however, patient recalls that she is in the hospital because of CVA with mod assist verbal semantic cues.  Patient continues to tolerate Dys.2 textures and thin liquids with no overt s/s of aspiration. Patient selectively attends during self-feeding tasks and utilizes safe swallowing strategies (e.g. multiple swallows, intermittent throat clear as needed, small bites and sips) with supervision cues. Patient coughed x1 today at lunch; however, SLP suspects it was not related to p.o. Intake.  Patient exhibits prolonged mastication  and, as a result, an upgrade to Dys. 3 textures is not recommended at this time.  Considering all current deficits and recommendation for 24/7 supervision upon discharge these goals are adequate for discharge at this time and SLP will focus therapy on dysphagia and left attention for the remainder of sessions. As a result, it is recommended that this patient continue to receive skilled SLP services to maximize swallow function, left attention and overall orientation and awareness.  SLP Frequency: 1-2 X/day, 30-60 minutes;5 out of 7 days SLP Treatment/Interventions: Cognitive remediation/compensation;Cueing hierarchy;Dysphagia/aspiration precaution training;Environmental controls;Functional tasks;Internal/external aids;Patient/family education;Speech/Language facilitation;Therapeutic Activities  Daily Session FIM:  Comprehension Comprehension Mode: Auditory Comprehension: 5-Understands basic 90% of the time/requires cueing < 10% of the time Expression Expression: 5-Expresses basic 90% of the time/requires cueing < 10% of the time. Social Interaction Social Interaction: 6-Interacts appropriately with others with medication or extra time (anti-anxiety, antidepressant). Problem Solving Problem Solving: 2-Solves basic 25 - 49% of the time - needs direction more than half the time to initiate, plan or complete simple activities Memory Memory: 2-Recognizes or recalls 25 - 49% of the time/requires cueing 51 - 75% of the time FIM - Eating Eating Activity: 5: Supervision/cues;5: Set-up assist for open containers    Jackalyn Lombard, Conrad   Graduate Clinician Speech Language Pathology  Page, Joni Reining 09/09/2011, 4:10 PM  The above skilled treatment note has been reviewed and SLP is in agreement. Fae Pippin, M.A., CCC-SLP [phone]

## 2011-09-09 NOTE — Progress Notes (Signed)
Speech Language Pathology Daily Session Note  Patient Details  Name: Cindy Abbott MRN: [id number] Date of Birth: 11/21/1922  Today's Date: 09/09/2011 Time: 1200-1235 Time Calculation (min): 35 min  Short Term Goals: Week 2: SLP Short Term Goal 1 (Week 2): Patient will consume Dys.2 textures and thin liquids with mod assist to utilize compensatory strategies to prevent overt s/s of aspiraiton  SLP Short Term Goal 2 (Week 2): Patient will consume trials of Dys.3 textures with mod assist verbal cues for efficient mastication and to monitor oral residue. SLP Short Term Goal 3 (Week 2): Patient will attend to left upper extremity with mod assist semantic cues during functional activities.  SLP Short Term Goal 4 (Week 2): Patient will attend to left of environment with mod assist semantic cues during functional tasks. SLP Short Term Goal 5 (Week 2): Patient will utilize external aids for orientation with mod assist semantic cues.  Skilled Therapeutic Interventions: Co-treatment with OT.  Treatment focused on addressing dysphagia goals during a self-feeding task; SLP facilitated the session with increased wait time and supervision level verbal cues to take small bites and utilize intermittent throat clears. Patient demonstrated increased time to effectively masticate dys. 2 textures.  Patient coughed x1 during lunch today; however, SLP suspects it was not related to p.o. intake   FIM:  Comprehension Comprehension Mode: Auditory Comprehension: 5-Understands basic 90% of the time/requires cueing < 10% of the time Expression Expression: 5-Expresses basic 90% of the time/requires cueing < 10% of the time. Social Interaction Social Interaction: 6-Interacts appropriately with others with medication or extra time (anti-anxiety, antidepressant). Problem Solving Problem Solving: 2-Solves basic 25 - 49% of the time - needs direction more than half the time to initiate, plan or complete simple  activities Memory Memory: 2-Recognizes or recalls 25 - 49% of the time/requires cueing 51 - 75% of the time FIM - Eating Eating Activity: 5: Supervision/cues;5: Set-up assist for open containers   Therapy/Group: Group Therapy  Jackalyn Lombard, Conrad Regal  Graduate Clinician Speech Language Pathology  Page, Joni Reining 09/09/2011, 2:12 PM The above skilled treatment note has been reviewed and SLP is in agreement. Fae Pippin, M.A., CCC-SLP [phone]

## 2011-09-10 ENCOUNTER — Inpatient Hospital Stay (HOSPITAL_COMMUNITY): Payer: Medicare Other | Admitting: Speech Pathology

## 2011-09-10 ENCOUNTER — Inpatient Hospital Stay (HOSPITAL_COMMUNITY): Payer: Medicare Other | Admitting: Physical Therapy

## 2011-09-10 ENCOUNTER — Inpatient Hospital Stay (HOSPITAL_COMMUNITY): Payer: Medicare Other | Admitting: Occupational Therapy

## 2011-09-10 LAB — CBC
Hemoglobin: 10.7 g/dL — ABNORMAL LOW (ref 12.0–15.0)
MCH: 25.9 pg — ABNORMAL LOW (ref 26.0–34.0)
MCV: 81.1 fL (ref 78.0–100.0)
RBC: 4.13 MIL/uL (ref 3.87–5.11)

## 2011-09-10 LAB — GLUCOSE, CAPILLARY: Glucose-Capillary: 178 mg/dL — ABNORMAL HIGH (ref 70–99)

## 2011-09-10 NOTE — Progress Notes (Signed)
Occupational Therapy Session Note  Patient Details  Name: Yun Gutierrez MRN: [id number] Date of Birth: [birth year]  Today's Date: 09/10/2011 Time: 1130-1200 Time Calculation (min): 30 min   Skilled Therapeutic Interventions/Progress Updates: Patient participated in [employer] today to address safety with self feeding.  Patient required cueing to attempt to utilize left upper extremity, and/or to keep left hand out of her food.  Patient required constant cueing for safety with pocketed food, rate of intake, bite size, and swallow strategies.       Therapy Documentation Precautions:  Precautions Precautions: Fall Precaution Comments: Dys 1, Nectar thick liquids, TEDs donned OOB Restrictions Weight Bearing Restrictions: No  Pain: Pain Assessment Pain Assessment: No/denies pain  See FIM for current functional status  Therapy/Group: Group Therapy  Collier Salina 09/10/2011, 4:01 PM

## 2011-09-10 NOTE — Progress Notes (Signed)
Patient ID: Cindy Abbott, female   DOB: [birth year], 78 y.o.   MRN: [id number]  Subjective/Complaints: Doing well, no dizziness, no CP or SOB.  "Give me some skin" Appreciate GI consult Review of Systems  Genitourinary:       Retention  All other systems reviewed and are negative.     Objective: Vital Signs: Blood pressure 115/48, pulse 59, temperature 98.3 F (36.8 C), temperature source Oral, resp. rate 18, height 5\' 5"  (1.651 m), weight 71.6 kg (157 lb 13.6 oz), SpO2 100.00%. No results found. Results for orders placed during the hospital encounter of 08/23/11 (from the past 72 hour(s))  PREPARE RBC (CROSSMATCH)     Status: Normal   Collection Time   09/07/11 11:08 AM      Component Value Range Comment   Order Confirmation ORDER PROCESSED BY BLOOD BANK     TYPE AND SCREEN     Status: Normal   Collection Time   09/07/11 11:08 AM      Component Value Range Comment   ABO/RH(D) O POS      Antibody Screen NEG      Sample Expiration 09/10/2011      Unit Number [id number]      Blood Component Type RBC LR PHER2      Unit division 00      Status of Unit ISSUED,FINAL      Transfusion Status OK TO TRANSFUSE      Crossmatch Result Compatible      Unit Number [id number]      Blood Component Type RBC LR PHER2      Unit division 00      Status of Unit ISSUED,FINAL      Transfusion Status OK TO TRANSFUSE      Crossmatch Result Compatible     ABO/RH     Status: Normal   Collection Time   09/07/11 11:08 AM      Component Value Range Comment   ABO/RH(D) O POS     GLUCOSE, CAPILLARY     Status: Abnormal   Collection Time   09/07/11 11:37 AM      Component Value Range Comment   Glucose-Capillary 159 (*) 70 - 99 mg/dL    Comment 1 Notify RN      Comment 2 Documented in Chart     GLUCOSE, CAPILLARY     Status: Abnormal   Collection Time   09/07/11  4:34 PM      Component Value Range Comment   Glucose-Capillary 53 (*) 70 - 99 mg/dL   GLUCOSE, CAPILLARY     Status: Abnormal   Collection  Time   09/07/11  5:26 PM      Component Value Range Comment   Glucose-Capillary 66 (*) 70 - 99 mg/dL    Comment 1 Notify RN     GLUCOSE, CAPILLARY     Status: Normal   Collection Time   09/07/11  6:03 PM      Component Value Range Comment   Glucose-Capillary 71  70 - 99 mg/dL   GLUCOSE, CAPILLARY     Status: Abnormal   Collection Time   09/07/11  9:08 PM      Component Value Range Comment   Glucose-Capillary 178 (*) 70 - 99 mg/dL   CBC     Status: Abnormal   Collection Time   09/08/11  6:48 AM      Component Value Range Comment   WBC 5.3  4.0 - 10.5 K/uL    RBC  3.59 (*) 3.87 - 5.11 MIL/uL    Hemoglobin 9.4 (*) 12.0 - 15.0 g/dL    HCT 16.1 (*) 09.6 - 46.0 %    MCV 79.9  78.0 - 100.0 fL    MCH 26.2  26.0 - 34.0 pg    MCHC 32.8  30.0 - 36.0 g/dL    RDW 04.5 (*) 40.9 - 15.5 %    Platelets 193  150 - 400 K/uL   GLUCOSE, CAPILLARY     Status: Abnormal   Collection Time   09/08/11  7:17 AM      Component Value Range Comment   Glucose-Capillary 120 (*) 70 - 99 mg/dL   GLUCOSE, CAPILLARY     Status: Abnormal   Collection Time   09/08/11 11:26 AM      Component Value Range Comment   Glucose-Capillary 113 (*) 70 - 99 mg/dL    Comment 1 Notify RN     GLUCOSE, CAPILLARY     Status: Abnormal   Collection Time   09/08/11  4:43 PM      Component Value Range Comment   Glucose-Capillary 59 (*) 70 - 99 mg/dL    Comment 1 Notify RN     GLUCOSE, CAPILLARY     Status: Abnormal   Collection Time   09/08/11  5:01 PM      Component Value Range Comment   Glucose-Capillary 59 (*) 70 - 99 mg/dL   GLUCOSE, CAPILLARY     Status: Normal   Collection Time   09/08/11  5:18 PM      Component Value Range Comment   Glucose-Capillary 87  70 - 99 mg/dL   GLUCOSE, CAPILLARY     Status: Abnormal   Collection Time   09/08/11  9:04 PM      Component Value Range Comment   Glucose-Capillary 203 (*) 70 - 99 mg/dL    Comment 1 Notify RN     GLUCOSE, CAPILLARY     Status: Abnormal   Collection Time   09/09/11  7:17 AM       Component Value Range Comment   Glucose-Capillary 140 (*) 70 - 99 mg/dL    Comment 1 Notify RN     GLUCOSE, CAPILLARY     Status: Abnormal   Collection Time   09/09/11 11:21 AM      Component Value Range Comment   Glucose-Capillary 151 (*) 70 - 99 mg/dL   GLUCOSE, CAPILLARY     Status: Normal   Collection Time   09/09/11  4:22 PM      Component Value Range Comment   Glucose-Capillary 82  70 - 99 mg/dL    Comment 1 Notify RN     GLUCOSE, CAPILLARY     Status: Abnormal   Collection Time   09/09/11  8:54 PM      Component Value Range Comment   Glucose-Capillary 230 (*) 70 - 99 mg/dL   GLUCOSE, CAPILLARY     Status: Abnormal   Collection Time   09/10/11  7:13 AM      Component Value Range Comment   Glucose-Capillary 134 (*) 70 - 99 mg/dL    Comment 1 Notify RN        HEENT: normal Cardio: RRR Resp: CTA B/L GI: BS positive Extremity:  No Edema Skin:   Intact Neuro: Confused, Flat, Normal Sensory and Abnormal Motor 4/5 on Left side UE and LE Musc/Skel:  Normal Skin is quite dry  Assessment/Plan: 1. Functional deficits secondary to R  ICA infarct with L HP, Mild L neglect which require 3+ hours per day of interdisciplinary therapy in a comprehensive inpatient rehab setting. Physiatrist is providing close team supervision and 24 hour management of active medical problems listed below. Physiatrist and rehab team continue to assess barriers to discharge/monitor patient progress toward functional and medical goals. FIM: FIM - Bathing Bathing Steps Patient Completed: Chest;Right Arm;Left Arm;Abdomen;Front perineal area;Buttocks;Right upper leg;Left upper leg Bathing: 4: Min-Patient completes 8-9 29f 10 parts or 75+ percent  FIM - Upper Body Dressing/Undressing Upper body dressing/undressing steps patient completed: Thread/unthread right bra strap;Thread/unthread left bra strap;Thread/unthread right sleeve of pullover shirt/dresss;Thread/unthread left sleeve of pullover shirt/dress;Put head  through opening of pull over shirt/dress;Pull shirt over trunk Upper body dressing/undressing: 4: Min-Patient completed 75 plus % of tasks FIM - Lower Body Dressing/Undressing Lower body dressing/undressing steps patient completed: Thread/unthread right underwear leg;Thread/unthread left underwear leg;Pull underwear up/down;Thread/unthread right pants leg;Thread/unthread left pants leg;Pull pants up/down;Don/Doff right shoe;Don/Doff left shoe Lower body dressing/undressing: 4: Steadying Assist  FIM - Toileting Toileting steps completed by patient: Performs perineal hygiene Toileting Assistive Devices: Grab bar or rail for support Toileting: 2: Max-Patient completed 1 of 3 steps  FIM - Diplomatic Services operational officer Devices: Grab bars Toilet Transfers: 3-To toilet/BSC: Mod A (lift or lower assist);3-From toilet/BSC: Mod A (lift or lower assist)  FIM - Bed/Chair Transfer Bed/Chair Transfer Assistive Devices: Arm rests Bed/Chair Transfer: 3: Chair or W/C > Bed: Mod A (lift or lower assist);3: Sit > Supine: Mod A (lifting assist/Pt. 50-74%/lift 2 legs)  FIM - Locomotion: Wheelchair Distance: 150 Locomotion: Wheelchair: 1: Total Assistance/staff pushes wheelchair (Pt<25%) FIM - Locomotion: Ambulation Locomotion: Ambulation Assistive Devices: Other (comment) (hall hand rail) Ambulation/Gait Assistance: 3: Mod assist Locomotion: Ambulation: 1: Travels less than 50 ft with moderate assistance (Pt: 50 - 74%)  Comprehension Comprehension Mode: Auditory Comprehension: 5-Understands complex 90% of the time/Cues < 10% of the time  Expression Expression Mode: Verbal Expression: 5-Expresses complex 90% of the time/cues < 10% of the time  Social Interaction Social Interaction: 4-Interacts appropriately 75 - 89% of the time - Needs redirection for appropriate language or to initiate interaction.  Problem Solving Problem Solving: 2-Solves basic 25 - 49% of the time - needs direction  more than half the time to initiate, plan or complete simple activities  Memory Memory: 2-Recognizes or recalls 25 - 49% of the time/requires cueing 51 - 75% of the time  Medical Problem List and Plan:  1. 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 DVT Prophylaxis/Anticoagulation: SCDs. Monitor for signs of DVT. No Lovenox therapy  3. Dysphagia. Dysphagia 1 diet with thin liquids as per modified barium swallow 08/23/2011. Monitor for any signs of aspiration. Followup speech therapy. . Monitor hydration  . Latest creatinine 1.18 4. Neuropsych: This patient is not capable of making decisions on her own behalf.  5. Diabetes mellitus. Controlled Hemoglobin A1c of 8.2. Lantus insulin 10 units each bedtime as well as NovoLog 10 units before lunch and supper and 5 units before breakfast . Hold parameters added to 10 unit dose to help avoid hypoglycemia. Generally her am cbg's have been better. Follow for now 6. Hypertension. Clonidine patch 0.2 mg weekly,.imdur 30 mg daily, Lopressor 25 mg twice a day. Monitor the increased mobility . Check orthostatics Patient denies any dizziness when up for therapies. No chest pain or shortness of breath 7. CAD status post pacemaker. Patient denies any chest pain. Continue aspirin therapy 8.  Anemia  f/u CBC last Hgb was 9.5 on 8/20, down to 7.0 transfused, back to 9.4 on 9/4, monitor CBC, will change to baby ASA  LOS (Days) 18 A FACE TO FACE EVALUATION WAS PERFORMED  KIRSTEINS,ANDREW E 09/10/2011, 8:29 AM

## 2011-09-10 NOTE — Progress Notes (Signed)
Physical Therapy Session Note  Patient Details  Name: Cindy Abbott MRN: [id number] Date of Birth: 12-23-1922  Today's Date: 09/10/2011 Time: 1535-1630 Time Calculation (min): 55 min  Short Term Goals: Week 2:  PT Short Term Goal 1 (Week 2): Patient will perform gait x 25' with max A and LRAD in controlled environment  PT Short Term Goal 1 - Progress (Week 2): Partly met PT Short Term Goal 2 (Week 2): Patient will perform standing balance and pre gait training with max A x 5 minutes  PT Short Term Goal 2 - Progress (Week 2): Met PT Short Term Goal 3 (Week 2): Patient will perform bed <> w/c transfers to L and R with mod A PT Short Term Goal 3 - Progress (Week 2): Met PT Short Term Goal 4 (Week 2): Patient will perform bed mobility on flat bed to L and R with min A PT Short Term Goal 4 - Progress (Week 2): Met Week 3:  PT Short Term Goal 1 (Week 3): = LTG     Therapy Documentation Precautions:  Precautions Precautions: Fall Precaution Comments: Dys 1, Nectar thick liquids, TEDs donned OOB Restrictions Weight Bearing Restrictions: No  Pain: Pain Assessment Pain Assessment: No/denies pain    Locomotion : Ambulation Ambulation: Yes Ambulation/Gait Assistance: Other (comment);1: +2 Total assist (Patient ambulated with HHA with mod assist x 2) Ambulation Distance (Feet): 50 Feet  Patient ambulated 25 feet x 2 with HHA and mod assist x 2. Patient required verbal cues during stance phase of LLE to ensure stabilization of extremity while performing swing phase of RLE. Patient required verbal cues for posture full LLE step length, and sequencing throughout.    Exercises: -Patient performed weight shifting to LLE in stance with RUE support and reaching activity using LUE. Patient required verbal cues and min to mod assist to reach with LUE for horseshoe, maintain upright posture when tossing horseshoe, and maintain body weight through LLE.  -Patient performed weight shifting to LLE and LUE  using sit <> squat transfer and reaching with RUE. Patient was able to maintain sustained contraction of LUE and LLE during reach and maintain squat position when tossing horseshoe. Patient required mod to max assist for this exercise to help maintain L weight shift.   Other Treatments:  PT spoke with patient's son about d/c plans, including appropriate DME, assistance when at home, etc. PT discussed that patient will be returning home at w/c level and importance of trying not to "lift" patient secondary to son's preexisting back condition. Also educated son on importance of not pulling on patient's LUE secondary to increased risk of subluxation. Son verbalized understanding. Son reports needing w/c, tub bench, and BSC at this time and that he will be having assistance come into home 3x/wk. W/c was measured and measurements given to social worker to be ordered for patient.   See FIM for current functional status  Therapy/Group: Individual Therapy  Marium Ragan DPT Student 09/10/2011, 4:41 PM

## 2011-09-10 NOTE — Progress Notes (Signed)
Speech Language Pathology Daily Session Note  Patient Details  Name: Sylvia Helms MRN: [id number] Date of Birth: 1922/07/25  Today's Date: 09/10/2011 Time: 1405-1430 Time Calculation (min): 25 min  Short Term Goals: Week 3: SLP Short Term Goal 1 (Week 3): Patient will consume Dys.2 textures and thin liquids with supervision level cues to utilize compensatory strategies to prevent overt s/s of aspiration. SLP Short Term Goal 2 (Week 3): Patient will consume trials of Dys.3 textures with mod assist verbal cues for efficient mastication and to monitor oral residue. SLP Short Term Goal 3 (Week 3): Patient will attend to left upper extremity with min assist semantic cues during functional activities.  SLP Short Term Goal 4 (Week 3): Patient will attend to left of environment with supervision level semantic cues during functional tasks. SLP Short Term Goal 5 (Week 3): Patient will utilize external aids for orientation with min assist semantic cues  Skilled Therapeutic Interventions: Treatment session focused on addressing orientation and dyaphagia goals.  Upon entering the room patient verbalilzed concern and confusion regarding current situation.  After SLP re-orientated patient with max assist verbal cues she appeared more at ease.  SLP also facilitated session with trials thin liquid sips via straw which resulted in a delayed weak cough response in 30% of sips.  Overall patient consumed 4 oz. of water and while SLP suspects trials resulted in intermittent penetration given occassional weak cough response but along with cognitive deficits and premorbid breathign problems SLP recommends continued use of no straws at discharge.  Family education still needs to be complete prior to dischrage 09/14/11.   FIM:  Comprehension Comprehension Mode: Auditory Comprehension: 5-Understands basic 90% of the time/requires cueing < 10% of the time Expression Expression: 5-Expresses basic 90% of the time/requires cueing  < 10% of the time. Social Interaction Social Interaction: 6-Interacts appropriately with others with medication or extra time (anti-anxiety, antidepressant). Problem Solving Problem Solving: 2-Solves basic 25 - 49% of the time - needs direction more than half the time to initiate, plan or complete simple activities Memory Memory: 2-Recognizes or recalls 25 - 49% of the time/requires cueing 51 - 75% of the time FIM - Eating Eating Activity: 5: Supervision/cues;5: Set-up assist for open containers  Pain Pain Assessment Pain Assessment: No/denies pain  Therapy/Group: Individual Therapy  Charlane Ferretti., CCC-SLP [phone]  Evaleigh Mccamy 09/10/2011, 3:35 PM

## 2011-09-10 NOTE — Progress Notes (Signed)
Social Work Patient ID: Cindy Abbott, female   DOB: Nov 21, 1922, 78 y.o.   MRN: [id number] Pt requires a lightweight wheelchair to self propel and utilizes it for her self care needs.  She is unable to self propel in a standard weight wheelchair. Pt is also going home at a wheelchair level and not ambulatory.

## 2011-09-10 NOTE — Progress Notes (Signed)
I have read and agree with the following treatment session note.  Philomene Haff Hall, PT, DPT  

## 2011-09-10 NOTE — Progress Notes (Signed)
Social Work Patient ID: Cindy Abbott, female   DOB: 10/20/1922, 78 y.o.   MRN: [id number] Met with pt and son to inform team conference and progression toward goals, scheduled family education for Monday. He will be here will order DME and set up home health.  Pt is ready to go home now.  Aware not eligible for CAP program due to not eligible For Medicaid makes too much money-retirement and social security.  Complete family education Monday for discharge Tuesday.

## 2011-09-10 NOTE — Progress Notes (Signed)
Speech Language Pathology Daily Session Note  Patient Details  Name: Cindy Abbott MRN: [id number] Date of Birth: February 22, 1922  Today's Date: 09/10/2011 Time: 1200-1230 Time Calculation (min): 30 min  Short Term Goals: Week 3: SLP Short Term Goal 1 (Week 3): Patient will consume Dys.2 textures and thin liquids with supervision level cues to utilize compensatory strategies to prevent overt s/s of aspiration. SLP Short Term Goal 2 (Week 3): Patient will consume trials of Dys.3 textures with mod assist verbal cues for efficient mastication and to monitor oral residue. SLP Short Term Goal 3 (Week 3): Patient will attend to left upper extremity with min assist semantic cues during functional activities.  SLP Short Term Goal 4 (Week 3): Patient will attend to left of environment with supervision level semantic cues during functional tasks. SLP Short Term Goal 5 (Week 3): Patient will utilize external aids for orientation with min assist semantic cues  Skilled Therapeutic Interventions: Co-treatment with OT; SLP facilitated session with mod assist verbal and supervision tactile cues to utilize small portions slowly while consuming Dys.2 textures and thin liquids via cup.  Patient required cues x2 to orally expectorate large boluses of meat that she was unable to masticate.  Additionally, patient exhibited coughing event x1 following a sip and SLP suspects that due to size of bolus and patient's response that aspiration occurred.  SLP cued for hard effortful cough which appeared to improve vocal quality and suspect reduced aspirates.     Pain Pain Assessment Pain Assessment: No/denies pain  Therapy/Group: Group Therapy  Charlane Ferretti., CCC-SLP [phone]  Rosco Harriott 09/10/2011, 1:27 PM

## 2011-09-10 NOTE — Progress Notes (Signed)
Occupational Therapy Session Note  Patient Details  Name: Cindy Abbott MRN: [id number] Date of Birth: 10-10-1922  Today's Date: 09/10/2011 Time: 1010-1100 Time Calculation (min): 50 min  Short Term Goals: Week 3:  OT Short Term Goal 1 (Week 3): Pt will complete UB dressing with setup and min cues for sequencing OT Short Term Goal 2 (Week 3): Pt will complete LB dressing with min/steadying assist OT Short Term Goal 3 (Week 3): Pt will complete toilet transfer with min assist with LRAD OT Short Term Goal 4 (Week 3): Pt will complete grooming with setup assist with LUE at diminished level.  Skilled Therapeutic Interventions/Progress Updates:    Pt seen for ADL retraining at sink. Focus on increased initiation and sequencing with self-care task of bathing, dressing, and grooming tasks. Pt required min cues for initiation and thoroughness with bathing. Occasional setup assist with soap on washcloth secondary to Lt hand weakness and decreased attention to task. Pt completed LB dressing at sit to stand level with min/steady assist in standing with increased focus on upright standing. Completed grooming at sink at sit <> stand level with focus on thoroughness with oral hygiene. Pt required multiple rest breaks throughout session secondary to wheezing and reports of fatigue.  Therapy Documentation Precautions:  Precautions Precautions: Fall Precaution Comments: Dys 1, Nectar thick liquids, TEDs donned OOB Restrictions Weight Bearing Restrictions: No General:   Vital Signs: Oxygen Therapy O2 Device: None (Room air) Pain:  Pt with no c/o pain this session.  See FIM for current functional status  Therapy/Group: Individual Therapy  [name] 09/10/2011, 12:09 PM

## 2011-09-11 ENCOUNTER — Inpatient Hospital Stay (HOSPITAL_COMMUNITY): Payer: Medicare Other | Admitting: Occupational Therapy

## 2011-09-11 DIAGNOSIS — Z5189 Encounter for other specified aftercare: Secondary | ICD-10-CM

## 2011-09-11 DIAGNOSIS — G8110 Spastic hemiplegia affecting unspecified side: Secondary | ICD-10-CM

## 2011-09-11 DIAGNOSIS — I6330 Cerebral infarction due to thrombosis of unspecified cerebral artery: Secondary | ICD-10-CM

## 2011-09-11 LAB — GLUCOSE, CAPILLARY: Glucose-Capillary: 144 mg/dL — ABNORMAL HIGH (ref 70–99)

## 2011-09-11 NOTE — Progress Notes (Signed)
Patient ID: Cindy Abbott, female   DOB: [birth year], 78 y.o.   MRN: [id number]  Subjective/Complaints: Doing well, slept well.  Appreciate GI consult Review of Systems  Genitourinary:       Retention  All other systems reviewed and are negative.     Objective: Vital Signs: Blood pressure 143/49, pulse 59, temperature 98.2 F (36.8 C), temperature source Oral, resp. rate 20, height 5\' 5"  (1.651 m), weight 71.6 kg (157 lb 13.6 oz), SpO2 99.00%. No results found. Results for orders placed during the hospital encounter of 08/23/11 (from the past 72 hour(s))  GLUCOSE, CAPILLARY     Status: Abnormal   Collection Time   09/08/11  7:17 AM      Component Value Range Comment   Glucose-Capillary 120 (*) 70 - 99 mg/dL   GLUCOSE, CAPILLARY     Status: Abnormal   Collection Time   09/08/11 11:26 AM      Component Value Range Comment   Glucose-Capillary 113 (*) 70 - 99 mg/dL    Comment 1 Notify RN     GLUCOSE, CAPILLARY     Status: Abnormal   Collection Time   09/08/11  4:43 PM      Component Value Range Comment   Glucose-Capillary 59 (*) 70 - 99 mg/dL    Comment 1 Notify RN     GLUCOSE, CAPILLARY     Status: Abnormal   Collection Time   09/08/11  5:01 PM      Component Value Range Comment   Glucose-Capillary 59 (*) 70 - 99 mg/dL   GLUCOSE, CAPILLARY     Status: Normal   Collection Time   09/08/11  5:18 PM      Component Value Range Comment   Glucose-Capillary 87  70 - 99 mg/dL   GLUCOSE, CAPILLARY     Status: Abnormal   Collection Time   09/08/11  9:04 PM      Component Value Range Comment   Glucose-Capillary 203 (*) 70 - 99 mg/dL    Comment 1 Notify RN     GLUCOSE, CAPILLARY     Status: Abnormal   Collection Time   09/09/11  7:17 AM      Component Value Range Comment   Glucose-Capillary 140 (*) 70 - 99 mg/dL    Comment 1 Notify RN     GLUCOSE, CAPILLARY     Status: Abnormal   Collection Time   09/09/11 11:21 AM      Component Value Range Comment   Glucose-Capillary 151 (*) 70 - 99 mg/dL     GLUCOSE, CAPILLARY     Status: Normal   Collection Time   09/09/11  4:22 PM      Component Value Range Comment   Glucose-Capillary 82  70 - 99 mg/dL    Comment 1 Notify RN     GLUCOSE, CAPILLARY     Status: Abnormal   Collection Time   09/09/11  8:54 PM      Component Value Range Comment   Glucose-Capillary 230 (*) 70 - 99 mg/dL   GLUCOSE, CAPILLARY     Status: Abnormal   Collection Time   09/10/11  7:13 AM      Component Value Range Comment   Glucose-Capillary 134 (*) 70 - 99 mg/dL    Comment 1 Notify RN     CBC     Status: Abnormal   Collection Time   09/10/11  9:06 AM      Component Value Range Comment  WBC 6.5  4.0 - 10.5 K/uL    RBC 4.13  3.87 - 5.11 MIL/uL    Hemoglobin 10.7 (*) 12.0 - 15.0 g/dL    HCT 16.1 (*) 09.6 - 46.0 %    MCV 81.1  78.0 - 100.0 fL    MCH 25.9 (*) 26.0 - 34.0 pg    MCHC 31.9  30.0 - 36.0 g/dL    RDW 04.5 (*) 40.9 - 15.5 %    Platelets 227  150 - 400 K/uL   GLUCOSE, CAPILLARY     Status: Abnormal   Collection Time   09/10/11 11:20 AM      Component Value Range Comment   Glucose-Capillary 116 (*) 70 - 99 mg/dL    Comment 1 Notify RN     GLUCOSE, CAPILLARY     Status: Normal   Collection Time   09/10/11  4:29 PM      Component Value Range Comment   Glucose-Capillary 87  70 - 99 mg/dL    Comment 1 Notify RN     GLUCOSE, CAPILLARY     Status: Abnormal   Collection Time   09/10/11  9:07 PM      Component Value Range Comment   Glucose-Capillary 178 (*) 70 - 99 mg/dL    Comment 1 Notify RN        HEENT: normal Cardio: RRR Resp: CTA B/L GI: BS positive Extremity:  No Edema Skin:   Intact Neuro: Confused, Flat, Normal Sensory and Abnormal Motor 4/5 on Left side UE and LE Musc/Skel:  Normal Skin is quite dry  Assessment/Plan: 1. Functional deficits secondary to R ICA infarct with L HP, Mild L neglect which require 3+ hours per day of interdisciplinary therapy in a comprehensive inpatient rehab setting. Physiatrist is providing close team supervision  and 24 hour management of active medical problems listed below. Physiatrist and rehab team continue to assess barriers to discharge/monitor patient progress toward functional and medical goals. FIM: FIM - Bathing Bathing Steps Patient Completed: Chest;Right Arm;Left Arm;Abdomen;Front perineal area;Buttocks;Right upper leg;Left upper leg Bathing: 4: Min-Patient completes 8-9 47f 10 parts or 75+ percent  FIM - Upper Body Dressing/Undressing Upper body dressing/undressing steps patient completed: Thread/unthread right sleeve of front closure shirt/dress;Thread/unthread left sleeve of front closure shirt/dress;Pull shirt around back of front closure shirt/dress Upper body dressing/undressing: 4: Min-Patient completed 75 plus % of tasks FIM - Lower Body Dressing/Undressing Lower body dressing/undressing steps patient completed: Thread/unthread right underwear leg;Thread/unthread left underwear leg;Pull underwear up/down;Thread/unthread right pants leg;Thread/unthread left pants leg;Pull pants up/down Lower body dressing/undressing: 4: Steadying Assist  FIM - Toileting Toileting steps completed by patient: Performs perineal hygiene Toileting Assistive Devices: Grab bar or rail for support Toileting: 2: Max-Patient completed 1 of 3 steps  FIM - Diplomatic Services operational officer Devices: Grab bars Toilet Transfers: 3-To toilet/BSC: Mod A (lift or lower assist);3-From toilet/BSC: Mod A (lift or lower assist)  FIM - Press photographer Assistive Devices: Arm rests Bed/Chair Transfer: 4: Bed > Chair or W/C: Min A (steadying Pt. > 75%);4: Chair or W/C > Bed: Min A (steadying Pt. > 75%)  FIM - Locomotion: Wheelchair Distance: 150 Locomotion: Wheelchair: 1: Total Assistance/staff pushes wheelchair (Pt<25%) FIM - Locomotion: Ambulation Locomotion: Ambulation Assistive Devices: Other (comment) (hall hand rail) Ambulation/Gait Assistance: Other (comment);1: +2 Total assist  (Patient ambulated with HHA with mod assist x 2) Locomotion: Ambulation: 2: Travels 50 - 149 ft with moderate assistance (Pt: 50 - 74%)  Comprehension Comprehension Mode: Auditory  Comprehension: 5-Understands basic 90% of the time/requires cueing < 10% of the time  Expression Expression Mode: Verbal Expression: 5-Expresses basic 90% of the time/requires cueing < 10% of the time.  Social Interaction Social Interaction: 6-Interacts appropriately with others with medication or extra time (anti-anxiety, antidepressant).  Problem Solving Problem Solving: 2-Solves basic 25 - 49% of the time - needs direction more than half the time to initiate, plan or complete simple activities  Memory Memory: 2-Recognizes or recalls 25 - 49% of the time/requires cueing 51 - 75% of the time  Medical Problem List and Plan:  1. 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 DVT Prophylaxis/Anticoagulation: SCDs. Monitor for signs of DVT. No Lovenox therapy  3. Dysphagia. Dysphagia 1 diet with thin liquids as per modified barium swallow 08/23/2011. Monitor for any signs of aspiration. Followup speech therapy. . Monitor hydration  . Latest creatinine 1.18 4. Neuropsych: This patient is not capable of making decisions on her own behalf.  5. Diabetes mellitus. Controlled Hemoglobin A1c of 8.2. Lantus insulin 10 units each bedtime as well as NovoLog 10 units before lunch and supper and 5 units before breakfast . Hold parameters added to 10 unit dose to help avoid hypoglycemia. Generally her am cbg's have been better and as a whole her cbg's have been leveling out. 6. Hypertension. Clonidine patch 0.2 mg weekly,.imdur 30 mg daily, Lopressor 25 mg twice a day. Monitor the increased mobility . Check orthostatics Patient denies any dizziness when up for therapies. No chest pain or shortness of breath 7. CAD status post pacemaker. Patient denies any chest pain. Continue aspirin therapy 8.   Anemia f/u CBC last Hgb was 9.5 on 8/20, down to 7.0 transfused, back to 9.4 on 9/4, monitor CBC, will change to baby ASA  LOS (Days) 19 A FACE TO FACE EVALUATION WAS PERFORMED  Nima Kemppainen T 09/11/2011, 6:56 AM

## 2011-09-11 NOTE — Progress Notes (Signed)
Speech Language Pathology Daily Session Note  Patient Details  Name: Cindy Abbott MRN: [id number] Date of Birth: 01-31-1922  Today's Date: 09/11/2011 Time: 1205-1235 Time Calculation (min): 30 min  Short Term Goals: Week 3: SLP Short Term Goal 1 (Week 3): Patient will consume Dys.2 textures and thin liquids with supervision level cues to utilize compensatory strategies to prevent overt s/s of aspiration. SLP Short Term Goal 2 (Week 3): Patient will consume trials of Dys.3 textures with mod assist verbal cues for efficient mastication and to monitor oral residue. SLP Short Term Goal 3 (Week 3): Patient will attend to left upper extremity with min assist semantic cues during functional activities.  SLP Short Term Goal 4 (Week 3): Patient will attend to left of environment with supervision level semantic cues during functional tasks. SLP Short Term Goal 5 (Week 3): Patient will utilize external aids for orientation with min assist semantic cues  Skilled Therapeutic Interventions: Co-treatment with OT; SLP facilitated session with mod assist verbal cues to utilize small portions slowly while consuming Dys.2 textures and thin liquids via cup. Pt without overt s/s of aspiration throughout meal.     FIM:  Comprehension Comprehension Mode: Auditory Comprehension: 5-Follows basic conversation/direction: With no assist Expression Expression: 5-Expresses basic 90% of the time/requires cueing < 10% of the time. Social Interaction Social Interaction: 6-Interacts appropriately with others with medication or extra time (anti-anxiety, antidepressant). Problem Solving Problem Solving: 3-Solves basic 50 - 74% of the time/requires cueing 25 - 49% of the time Memory Memory: 2-Recognizes or recalls 25 - 49% of the time/requires cueing 51 - 75% of the time FIM - Eating Eating Activity: 5: Supervision/cues;5: Set-up assist for open containers  Pain Pain Assessment Pain Assessment: No/denies  pain  Therapy/Group: Group Therapy  [name] 09/11/2011, 4:04 PM

## 2011-09-11 NOTE — Progress Notes (Signed)
Occupational Therapy Session Note  Patient Details  Name: Cindy Abbott MRN: [id number] Date of Birth: 1922/06/17  Today's Date: 09/11/2011 Time: 1130-1205 Time Calculation (min): 35 min  Second session: Time:  14:00-15:00 Time Calculation (min):  60 min   Skilled Therapeutic Interventions/Progress Updates:    Session 1:  Worked on self feeding in group setting.  Focus on using the LUE as a stabilizer for holding her bowels while she scooped up the food with the RUE.  Pt needing min assist to follow her swallowing precautions as well.  Session 2:  Worked on LUE functional use in group setting encouraging upright sitting posture, reach and pinch to pick up and place cards.  Pt able to initiate LUE use with mod facilitation to pick up cards from the table using lateral pinch and place in the appropriate spot.  Still with kyphotic posture and posterior pelvic tilt in sitting.  Able to sit in anterior pelvic tilt for brief periods of time ( 30 seconds).  Therapy Documentation Precautions:  Precautions Precautions: Fall Precaution Comments: Dys 1, Nectar thick liquids, TEDs donned OOB Restrictions Weight Bearing Restrictions: No  Pain: Pain Assessment Pain Assessment: No/denies pain ADL:  See FIM for current functional status  Therapy/Group: Group Therapy both sessions  Sita Mangen,JAMESOTR/L 09/11/2011, 4:33 PM

## 2011-09-12 ENCOUNTER — Inpatient Hospital Stay (HOSPITAL_COMMUNITY): Payer: Medicare Other | Admitting: Occupational Therapy

## 2011-09-12 LAB — GLUCOSE, CAPILLARY
Glucose-Capillary: 111 mg/dL — ABNORMAL HIGH (ref 70–99)
Glucose-Capillary: 164 mg/dL — ABNORMAL HIGH (ref 70–99)

## 2011-09-12 NOTE — Plan of Care (Signed)
Problem: RH KNOWLEDGE DEFICIT Goal: RH STG INCREASE KNOWLEDGE OF DIABETES Patient and/or Caregiver will be able to verbalize medication used to manage diabetes  Outcome: Not Progressing No family here to review education with, patient unable to verbalize information

## 2011-09-12 NOTE — Plan of Care (Signed)
Problem: RH KNOWLEDGE DEFICIT Goal: RH STG INCREASE KNOWLEDGE OF DYSPHAGIA/FLUID INTAKE Patient and/or caregiver will verbalize methods to manage dysphagia  Outcome: Not Progressing Son not present for review of information, patient can not verbalize information

## 2011-09-12 NOTE — Progress Notes (Signed)
Patient ID: Cindy Abbott, female   DOB: 21-Oct-1922, 78 y.o.   MRN: [id number]  Subjective/Complaints: No new issues presently.  Review of Systems  Genitourinary:       Retention  All other systems reviewed and are negative.     Objective: Vital Signs: Blood pressure 157/64, pulse 61, temperature 97.7 F (36.5 C), temperature source Oral, resp. rate 18, height 5\' 5"  (1.651 m), weight 71.6 kg (157 lb 13.6 oz), SpO2 100.00%. No results found. Results for orders placed during the hospital encounter of 08/23/11 (from the past 72 hour(s))  GLUCOSE, CAPILLARY     Status: Abnormal   Collection Time   09/09/11  7:17 AM      Component Value Range Comment   Glucose-Capillary 140 (*) 70 - 99 mg/dL    Comment 1 Notify RN     GLUCOSE, CAPILLARY     Status: Abnormal   Collection Time   09/09/11 11:21 AM      Component Value Range Comment   Glucose-Capillary 151 (*) 70 - 99 mg/dL   GLUCOSE, CAPILLARY     Status: Normal   Collection Time   09/09/11  4:22 PM      Component Value Range Comment   Glucose-Capillary 82  70 - 99 mg/dL    Comment 1 Notify RN     GLUCOSE, CAPILLARY     Status: Abnormal   Collection Time   09/09/11  8:54 PM      Component Value Range Comment   Glucose-Capillary 230 (*) 70 - 99 mg/dL   GLUCOSE, CAPILLARY     Status: Abnormal   Collection Time   09/10/11  7:13 AM      Component Value Range Comment   Glucose-Capillary 134 (*) 70 - 99 mg/dL    Comment 1 Notify RN     CBC     Status: Abnormal   Collection Time   09/10/11  9:06 AM      Component Value Range Comment   WBC 6.5  4.0 - 10.5 K/uL    RBC 4.13  3.87 - 5.11 MIL/uL    Hemoglobin 10.7 (*) 12.0 - 15.0 g/dL    HCT 40.9 (*) 81.1 - 46.0 %    MCV 81.1  78.0 - 100.0 fL    MCH 25.9 (*) 26.0 - 34.0 pg    MCHC 31.9  30.0 - 36.0 g/dL    RDW 91.4 (*) 78.2 - 15.5 %    Platelets 227  150 - 400 K/uL   GLUCOSE, CAPILLARY     Status: Abnormal   Collection Time   09/10/11 11:20 AM      Component Value Range Comment   Glucose-Capillary 116 (*) 70 - 99 mg/dL    Comment 1 Notify RN     GLUCOSE, CAPILLARY     Status: Normal   Collection Time   09/10/11  4:29 PM      Component Value Range Comment   Glucose-Capillary 87  70 - 99 mg/dL    Comment 1 Notify RN     GLUCOSE, CAPILLARY     Status: Abnormal   Collection Time   09/10/11  9:07 PM      Component Value Range Comment   Glucose-Capillary 178 (*) 70 - 99 mg/dL    Comment 1 Notify RN     GLUCOSE, CAPILLARY     Status: Abnormal   Collection Time   09/11/11  7:22 AM      Component Value Range Comment  Glucose-Capillary 144 (*) 70 - 99 mg/dL    Comment 1 Notify RN     GLUCOSE, CAPILLARY     Status: Abnormal   Collection Time   09/11/11 11:13 AM      Component Value Range Comment   Glucose-Capillary 194 (*) 70 - 99 mg/dL   GLUCOSE, CAPILLARY     Status: Abnormal   Collection Time   09/11/11  4:44 PM      Component Value Range Comment   Glucose-Capillary 108 (*) 70 - 99 mg/dL   GLUCOSE, CAPILLARY     Status: Normal   Collection Time   09/11/11  8:44 PM      Component Value Range Comment   Glucose-Capillary 88  70 - 99 mg/dL    Comment 1 Notify RN        HEENT: normal Cardio: RRR Resp: CTA B/L GI: BS positive Extremity:  No Edema Skin:   Intact Neuro: Confused, Flat, Normal Sensory and Abnormal Motor 4/5 on Left side UE and LE Musc/Skel:  Normal Skin is quite dry  Assessment/Plan: 1. Functional deficits secondary to R ICA infarct with L HP, Mild L neglect which require 3+ hours per day of interdisciplinary therapy in a comprehensive inpatient rehab setting. Physiatrist is providing close team supervision and 24 hour management of active medical problems listed below. Physiatrist and rehab team continue to assess barriers to discharge/monitor patient progress toward functional and medical goals. FIM: FIM - Bathing Bathing Steps Patient Completed: Chest;Right Arm;Left Arm;Abdomen;Front perineal area;Buttocks;Right upper leg;Left upper leg Bathing:  4: Min-Patient completes 8-9 62f 10 parts or 75+ percent  FIM - Upper Body Dressing/Undressing Upper body dressing/undressing steps patient completed: Thread/unthread right sleeve of front closure shirt/dress;Thread/unthread left sleeve of front closure shirt/dress;Pull shirt around back of front closure shirt/dress Upper body dressing/undressing: 4: Min-Patient completed 75 plus % of tasks FIM - Lower Body Dressing/Undressing Lower body dressing/undressing steps patient completed: Thread/unthread right underwear leg;Thread/unthread left underwear leg;Pull underwear up/down;Thread/unthread right pants leg;Thread/unthread left pants leg;Pull pants up/down Lower body dressing/undressing: 4: Steadying Assist  FIM - Toileting Toileting steps completed by patient: Performs perineal hygiene Toileting Assistive Devices: Grab bar or rail for support Toileting: 2: Max-Patient completed 1 of 3 steps  FIM - Diplomatic Services operational officer Devices: Psychiatrist Transfers: 3-To toilet/BSC: Mod A (lift or lower assist);3-From toilet/BSC: Mod A (lift or lower assist)  FIM - Press photographer Assistive Devices: Arm rests Bed/Chair Transfer: 4: Bed > Chair or W/C: Min A (steadying Pt. > 75%);4: Chair or W/C > Bed: Min A (steadying Pt. > 75%)  FIM - Locomotion: Wheelchair Distance: 150 Locomotion: Wheelchair: 1: Total Assistance/staff pushes wheelchair (Pt<25%) FIM - Locomotion: Ambulation Locomotion: Ambulation Assistive Devices: Other (comment) (hall hand rail) Ambulation/Gait Assistance: Other (comment);1: +2 Total assist (Patient ambulated with HHA with mod assist x 2) Locomotion: Ambulation: 2: Travels 50 - 149 ft with moderate assistance (Pt: 50 - 74%)  Comprehension Comprehension Mode: Auditory Comprehension: 5-Follows basic conversation/direction: With no assist  Expression Expression Mode: Verbal Expression: 5-Expresses basic 90% of the  time/requires cueing < 10% of the time.  Social Interaction Social Interaction: 6-Interacts appropriately with others with medication or extra time (anti-anxiety, antidepressant).  Problem Solving Problem Solving: 3-Solves basic 50 - 74% of the time/requires cueing 25 - 49% of the time  Memory Memory: 2-Recognizes or recalls 25 - 49% of the time/requires cueing 51 - 75% of the time  Medical Problem List and Plan:  1. Right  frontal and parietal thrombotic infarct with left hemiparesthesias  2. DVT Prophylaxis/Anticoagulation: SCDs. Monitor for signs of DVT. No Lovenox therapy  3. Dysphagia. Dysphagia 1 diet with thin liquids as per modified barium swallow 08/23/2011. Monitor for any signs of aspiration. Followup speech therapy. . Monitor hydration  . Latest creatinine 1.18  -eating quite well at present 4. Neuropsych: This patient is not capable of making decisions on her own behalf.  5. Diabetes mellitus. Controlled Hemoglobin A1c of 8.2. Lantus insulin 10 units each bedtime as well as NovoLog 10 units before lunch and supper and 5 units before breakfast . Hold parameters added to 10 unit dose to help avoid hypoglycemia. Generally her am cbg's have been better and as a whole her cbg's have been leveling out. 6. Hypertension. Clonidine patch 0.2 mg weekly,.imdur 30 mg daily, Lopressor 25 mg twice a day.. Check orthostatics Patient denies any dizziness when up for therapies. No chest pain or shortness of breath. Borderline to fair control at present 7. CAD status post pacemaker. Patient denies any chest pain. Continue aspirin therapy 8.  Anemia f/u CBC last Hgb was 9.5 on 8/20, down to 7.0 transfused, back to 9.4 on 9/4, monitor CBC, will change to baby ASA  LOS (Days) 20 A FACE TO FACE EVALUATION WAS PERFORMED  Cindy Abbott 09/12/2011, 6:59 AM

## 2011-09-12 NOTE — Progress Notes (Signed)
Occupational Therapy Session Note  Patient Details  Name: Kaelene Elliston MRN: [id number] Date of Birth: 26-Nov-1922  Today's Date: 09/12/2011 Time: 1105-1205 Time Calculation (min): 60 min  Short Term Goals: Week 3:  OT Short Term Goal 1 (Week 3): Pt will complete UB dressing with setup and min cues for sequencing OT Short Term Goal 2 (Week 3): Pt will complete LB dressing with min/steadying assist OT Short Term Goal 3 (Week 3): Pt will complete toilet transfer with min assist with LRAD OT Short Term Goal 4 (Week 3): Pt will complete grooming with setup assist with LUE at diminished level.  Skilled Therapeutic Interventions/Progress Updates:  Patient resting in bed upon arrival.  Self care retraining to include bed, toilet and shower transfers, toileting and dressing.  Focuses session on activity tolerance, increased initiation and sequencing with self-care tasks, forced use of LUE and safe transfers.  Patient often initiates use of LUE without assistance to complete a task ~25% of the time.  Patient generally needs verbal and tactile/manual cues to complete a task successfully.  Therapy Documentation Precautions:  Precautions Precautions: Fall Precaution Comments: Dys 1, Nectar thick liquids, TEDs donned OOB Restrictions Weight Bearing Restrictions: No Pain: Denies pain See FIM for current functional status  Therapy/Group: Individual Therapy  Johnthomas Lader 09/12/2011, 12:10 PM

## 2011-09-12 NOTE — Plan of Care (Signed)
Problem: RH BLADDER ELIMINATION Goal: RH STG MANAGE BLADDER WITH ASSISTANCE STG Manage Bladder With minimal Assistance  Outcome: Progressing Continent of bladder with toileting, pt does not call. Assess need on rounds, offer every 3 hours and prn

## 2011-09-12 NOTE — Plan of Care (Signed)
Problem: RH COGNITION-NURSING Goal: RH STG ANTICIPATES NEEDS/CALLS FOR ASSIST W/ASSIST/CUES STG Anticipates Needs/Calls for Assist With Min Assistance/Cues.  Outcome: Not Applicable Date Met:  09/12/11 Patient can verbalize and demostrate use of call light however she does not use the call light, she makes needs known to staff when in the room

## 2011-09-12 NOTE — Plan of Care (Signed)
Problem: RH BOWEL ELIMINATION Goal: RH STG MANAGE BOWEL W/MEDICATION W/ASSISTANCE STG Manage Bowel with Medication withminimal Assistance.  Outcome: Progressing Senokot 1 tab at bedtime

## 2011-09-12 NOTE — Plan of Care (Signed)
Problem: RH BOWEL ELIMINATION Goal: RH STG MANAGE BOWEL WITH ASSISTANCE STG Manage Bowel with minimal Assistance.  Outcome: Progressing Continent of bowel

## 2011-09-13 ENCOUNTER — Inpatient Hospital Stay (HOSPITAL_COMMUNITY): Payer: Medicare Other | Admitting: Physical Therapy

## 2011-09-13 ENCOUNTER — Inpatient Hospital Stay (HOSPITAL_COMMUNITY): Payer: Medicare Other | Admitting: Occupational Therapy

## 2011-09-13 ENCOUNTER — Inpatient Hospital Stay (HOSPITAL_COMMUNITY): Payer: Medicare Other | Admitting: Speech Pathology

## 2011-09-13 DIAGNOSIS — Z5189 Encounter for other specified aftercare: Secondary | ICD-10-CM

## 2011-09-13 DIAGNOSIS — G8110 Spastic hemiplegia affecting unspecified side: Secondary | ICD-10-CM

## 2011-09-13 DIAGNOSIS — I6330 Cerebral infarction due to thrombosis of unspecified cerebral artery: Secondary | ICD-10-CM

## 2011-09-13 LAB — CBC
HCT: 32 % — ABNORMAL LOW (ref 36.0–46.0)
MCHC: 31.6 g/dL (ref 30.0–36.0)
RDW: 19.8 % — ABNORMAL HIGH (ref 11.5–15.5)

## 2011-09-13 LAB — GLUCOSE, CAPILLARY
Glucose-Capillary: 125 mg/dL — ABNORMAL HIGH (ref 70–99)
Glucose-Capillary: 127 mg/dL — ABNORMAL HIGH (ref 70–99)
Glucose-Capillary: 110 mg/dL — ABNORMAL HIGH (ref 70–99)

## 2011-09-13 NOTE — Progress Notes (Signed)
Speech Language Pathology Daily Session Note  Patient Details  Name: Edison Wollschlager MRN: [id number] Date of Birth: [birth year]  Today's Date: 09/13/2011 Time: 1200-1225 Time Calculation (min): 25 min  Short Term Goals: Week 3: SLP Short Term Goal 1 (Week 3): Patient will consume Dys.2 textures and thin liquids with supervision level cues to utilize compensatory strategies to prevent overt s/s of aspiration. SLP Short Term Goal 2 (Week 3): Patient will consume trials of Dys.3 textures with mod assist verbal cues for efficient mastication and to monitor oral residue. SLP Short Term Goal 3 (Week 3): Patient will attend to left upper extremity with min assist semantic cues during functional activities.  SLP Short Term Goal 4 (Week 3): Patient will attend to left of environment with supervision level semantic cues during functional tasks. SLP Short Term Goal 5 (Week 3): Patient will utilize external aids for orientation with min assist semantic cues  Skilled Therapeutic Interventions: Co-treatment with OT.  SLP facilitated session with min assist level verbal cues to check for pocketing while consuming Dys. 2 textures and thin liquids via cup.  SLP also facilitated the session with supervision level cues to utillize  small portions during a self feeding task.  Pt had no overt s/s of aspiration throughout the meal    FIM:  Comprehension Comprehension Mode: Auditory Comprehension: 4-Understands basic 75 - 89% of the time/requires cueing 10 - 24% of the time Expression Expression Mode: Verbal Expression: 4-Expresses basic 75 - 89% of the time/requires cueing 10 - 24% of the time. Needs helper to occlude trach/needs to repeat words. Social Interaction Social Interaction: 6-Interacts appropriately with others with medication or extra time (anti-anxiety, antidepressant). Problem Solving Problem Solving: 2-Solves basic 25 - 49% of the time - needs direction more than half the time to initiate, plan or  complete simple activities Memory Memory: 2-Recognizes or recalls 25 - 49% of the time/requires cueing 51 - 75% of the time FIM - Eating Eating Activity: 5: Set-up assist for open containers;5: Supervision/cues  Therapy/Group: Group Therapy  Jackalyn Lombard, Conrad [place]  Graduate Clinician Speech Language Pathology  Page, Joni Reining 09/13/2011, 1:25 PM  The above skilled treatment note has been reviewed and SLP is in agreement. Fae Pippin, M.A., CCC-SLP [phone]

## 2011-09-13 NOTE — Progress Notes (Signed)
Speech Language Pathology Daily Session Note  Patient Details  Name: Cindy Abbott MRN: [id number] Date of Birth: 1922-03-27  Today's Date: 09/13/2011 Time: 1100-1130 Time Calculation (min): 30 min  Short Term Goals: Week 3: SLP Short Term Goal 1 (Week 3): Patient will consume Dys.2 textures and thin liquids with supervision level cues to utilize compensatory strategies to prevent overt s/s of aspiration. SLP Short Term Goal 2 (Week 3): Patient will consume trials of Dys.3 textures with mod assist verbal cues for efficient mastication and to monitor oral residue. SLP Short Term Goal 3 (Week 3): Patient will attend to left upper extremity with min assist semantic cues during functional activities.  SLP Short Term Goal 4 (Week 3): Patient will attend to left of environment with supervision level semantic cues during functional tasks. SLP Short Term Goal 5 (Week 3): Patient will utilize external aids for orientation with min assist semantic cues  Skilled Therapeutic Interventions: Treatment focused on addressing cognition goals.  SLP initiated a discussion with the client about her discharge tomorrow and facilitated the session with max to total assist verbal cues to help patient verbally recall her safe swallowing strategies.  Patient's family was not available for education at this time, and as a result, the SLP re-educated patient about Dys. 2 textures.   SLP left a list of Dys 2. textures and a list of the patient's compensatory swallowing strategies (e.g. small bites and sips, intermittent throat clear as needed, no straws) in the patient's room for the family.  SLP plans to meet with the family tomorrow before discharge to complete family education.     FIM:  Comprehension Comprehension Mode: Auditory Comprehension: 4-Understands basic 75 - 89% of the time/requires cueing 10 - 24% of the time Expression Expression Mode: Verbal Expression: 4-Expresses basic 75 - 89% of the time/requires cueing 10  - 24% of the time. Needs helper to occlude trach/needs to repeat words. Social Interaction Social Interaction: 6-Interacts appropriately with others with medication or extra time (anti-anxiety, antidepressant). Problem Solving Problem Solving: 2-Solves basic 25 - 49% of the time - needs direction more than half the time to initiate, plan or complete simple activities Memory Memory: 2-Recognizes or recalls 25 - 49% of the time/requires cueing 51 - 75% of the time    Therapy/Group: Individual Therapy  Jackalyn Lombard, Conrad [place]  Graduate Clinician Speech Language Pathology   Page, Joni Reining 09/13/2011, 1:43 PM  The above skilled treatment note has been reviewed and SLP is in agreement. Fae Pippin, M.A., CCC-SLP [phone]

## 2011-09-13 NOTE — Progress Notes (Signed)
Social Work Patient ID: Cindy Abbott, female   DOB: 1922-11-13, 78 y.o.   MRN: [id number] Met with pt, son and D-I-L to re-schedule family education for tomorrow at 10:00-11:00.  Hopefully he will get here and be able to go  Through education.  He is aware if not pt will be discharged regardless.  Have arranged follow up therapies and DME delivered. See tomorrow.

## 2011-09-13 NOTE — Progress Notes (Signed)
Speech Language Pathology Discharge Summary  Patient Details  Name: Mandalyn Pasqua MRN: [id number] Date of Birth: [birth year]  Today's Date: 09/13/2011   Patient has met 4 of 6 long term goals.  Patient to discharge at [facility] level.  Reasons goals not met:  daily fluctuating physical and cognitive status   Clinical Impression/Discharge Summary: Patient met 4 out of 6 long term objectives while on CIR with gains in dysphagia management, attention to the left environment and upper extremity, and orientation. The patient's orientation continues to fluctuate throughout the day; however, patient recalls that she is in the hospital because of CVA with mod assist verbal semantic cues. Patient continues to tolerate Dys.2 textures and thin liquids with no overt s/s of aspiration. Patient selectively attends during self-feeding tasks and utilizes safe swallowing strategies (e.g. multiple swallows, intermittent throat clear as needed, small bites and sips) with supervision cues. Patient continues to exhibit prolonged mastication while consuming Dys. 2 textures.  SLP recommends further therapy focusing on dysphagia and left attention in order to maximize functional independence and reduce burden of care upon discharge.    Considering all current deficits and recommendations, 24/7 supervision upon discharge is still recommended.   Family was not present for education on 9/9; however, education with son was completed on 9/10.   Care Partner:  Caregiver Able to Provide Assistance: Yes  Type of Caregiver Assistance: Physical;Cognitive  Recommendation:  Home Health SLP;24 hour supervision/assistance  Rationale for SLP Follow Up: Reduce caregiver burden;Maximize functional communication;Maximize swallowing safety;Maximize cognitive function and independence    Reasons for discharge: Discharged from hospital      See FIM for current functional status  Jackalyn Lombard, Conrad [place]  Graduate Clinician Speech Language  Pathology   Page, Joni Reining 09/13/2011, 2:19 PM  The above discharge summary has been reviewed and SLP is in agreement. Fae Pippin, M.A., CCC-SLP [phone]

## 2011-09-13 NOTE — Progress Notes (Signed)
Physical Therapy Discharge Summary  Patient Details  Name: Cindy Abbott MRN: [id number] Date of Birth: [birth year]  Today's Date: 09/13/2011 Time: 9147-8295 Time Calculation (min): 60 min  Patient has met 6 of 8 long term goals due to improved activity tolerance, improved balance, improved postural control, increased strength, ability to compensate for deficits and functional use of  left upper extremity and left lower extremity.  Patient to discharge at a wheelchair level Min Assist.   Patient's care partner is independent to provide the necessary physical, cognitive and supervision assistance at discharge.  Reasons goals not met: Household and controlled environment gait distance and assistance goal not met.  Patient requires mod A with HHA or with RW secondary to continued increased assistance with LLE advancement, balance and trunk control.  Did not meet distance goal of 25'-50' secondary to continued decreased activity tolerance/endurance.  Patient will D/C home at w/c level with son's assistance for transfers and ambulation short distance to/from toilet in her bathroom.    Recommendation:  Patient will benefit from ongoing skilled PT services in home health setting to continue to advance safe functional mobility, address ongoing impairments in L sided weakness, impaired timing/sequencing, motor control and sequencing, activity tolerance and endurance, trunk and postural control, standing balance, gait, and minimize fall risk.  Equipment: w/c  Reasons for discharge: discharge from hospital  Patient/family agrees with progress made and goals achieved: Yes  PT Discharge Vital Signs Therapy Vitals Pulse Rate: 63  Oxygen Therapy SpO2: 100 % O2 Device: None (Room air) Pulse Oximetry Type: Intermittent Pain Pain Assessment Pain Assessment: No/denies pain Pain Score: 0-No pain Cognition Overall Cognitive Status: Impaired Arousal/Alertness: Awake/alert Orientation Level: Oriented to  person;Oriented to place;Oriented to situation Attention: Focused;Sustained Focused Attention: Appears intact Sustained Attention: Impaired Sustained Attention Impairment: Verbal basic;Functional basic Memory: Impaired Memory Impairment: Storage deficit;Retrieval deficit;Decreased recall of new information;Decreased short term memory Decreased Short Term Memory: Verbal basic;Functional basic Awareness: Impaired Awareness Impairment: Intellectual impairment Problem Solving: Impaired Problem Solving Impairment: Verbal basic;Functional basic Executive Function: Sequencing;Organizing;Decision Making;Self Monitoring;Self Correcting Sequencing: Impaired Sequencing Impairment: Verbal basic Organizing: Impaired Organizing Impairment: Verbal basic;Functional basic Decision Making: Impaired Decision Making Impairment: Verbal basic;Functional basic Self Monitoring: Impaired Self Monitoring Impairment: Verbal basic;Functional basic Self Correcting: Impaired Self Correcting Impairment: Verbal basic;Functional basic Behaviors: Perseveration Safety/Judgment: Impaired Sensation Sensation Light Touch: Appears Intact Stereognosis: Not tested Hot/Cold: Not tested Proprioception: Impaired by gross assessment Additional Comments: Requires cues for L hand placement, positioning on grab bars and for foot position during standing/ gait Coordination Gross Motor Movements are Fluid and Coordinated: No Fine Motor Movements are Fluid and Coordinated: No Motor  Motor Motor: Hemiplegia Motor - Discharge Observations: L hemiplegia, continues to stand and sit in significant posterior pelvic tilt and thoracic flexion but leaning improved  Mobility Bed Mobility Bed Mobility: Supine to Sit;Sit to Supine Supine to Sit: 5: Supervision Supine to Sit Details (indicate cue type and reason): verbal cues for safety and to initiate and for sequencing Sit to Supine: 4: Min assist Sit to Supine - Details (indicate  cue type and reason): Assistance to lift LLE into bed and for repositioning on side in bed, flat bed, no rail Transfers Stand Pivot Transfers: 4: Min assist Stand Pivot Transfer Details (indicate cue type and reason): Min HHA bed <> w/c and w/c <> car; patient able to use bilat UE to push up from w/c but requires HHA during pivot secondary to decreased balance, endurance and for safety and sequencing of pivot and cues for  full advancement of LLE; patient also performed standing at toilet with min A for stability while using RUE and Bilat UE for clothing adjustment and hygiene.  Very fatigued after toileting and required prolonged rest break but was continent in toilet. Locomotion  Ambulation Ambulation/Gait Assistance: 3: Mod assist Ambulation Distance (Feet): 12 Feet Assistive device: 1 person hand held assist Ambulation/Gait Assistance Details: HHA x 12' to and from toilet with mod A for balance, upright posture and multiple cues for full step length and foot clearance LLE; fatigued very quickly with SOB Stairs / Additional Locomotion Stairs: Yes Stairs Assistance: 4: Min assist Stairs Assistance Details (indicate cue type and reason): L HHA with cues for hand placement on rail, safe stepping sequence and for safety; patient very fatigued after stairs with SOB.  No assistance needed to advance to next step. Stair Management Technique: One rail Right;Step to pattern;Forwards Number of Stairs: 4  Height of Stairs: 6.5  Wheelchair Mobility Wheelchair Mobility: No Distance: 150  Trunk/Postural Assessment  Postural Control Postural Control: Deficits on evaluation (still requires cues for upright trunk sitting/standing)  Balance  Supervision sitting balance and min A standing balance with UE support Extremity Assessment  RLE Assessment RLE Assessment: Within Functional Limits LLE Strength LLE Overall Strength: Deficits LLE Overall Strength Comments: 3-4/5 throughout; poor functional  endurance during standing/gait  See FIM for current functional status  Edman Circle [facility] 09/13/2011, 11:25 AM

## 2011-09-13 NOTE — Progress Notes (Signed)
Occupational Therapy Session Note  Patient Details  Name: Cindy Abbott MRN: [id number] Date of Birth: 05/01/1922  Today's Date: 09/13/2011 Time: 1004-1100 Time Calculation (min): 56 min  Short Term Goals: Week 3:  OT Short Term Goal 1 (Week 3): Pt will complete UB dressing with setup and min cues for sequencing OT Short Term Goal 2 (Week 3): Pt will complete LB dressing with min/steadying assist OT Short Term Goal 3 (Week 3): Pt will complete toilet transfer with min assist with LRAD OT Short Term Goal 4 (Week 3): Pt will complete grooming with setup assist with LUE at diminished level.  Skilled Therapeutic Interventions/Progress Updates:    Pt completed ADL retraining at sink per pt's request.  Pt bathes every few days and completed bathing at shower level during OT session yesterday.  Pt achieved goal level with bathing and shower transfer during session yesterday.  UB and LB dressing completed at sit to stand level at sink with focus on cues for sequencing and problem solving.  Pt required multiple rest breaks secondary to fatigue from PT session just prior to this session.  Pt demonstrated use of LUE with self-care tasks with min cues to attempt use.  Pt's son was not present for session.  Therapy Documentation Precautions:  Precautions Precautions: Fall Precaution Comments: Dys 1, Nectar thick liquids, TEDs donned OOB Restrictions Weight Bearing Restrictions: No General:   Vital Signs: Therapy Vitals Pulse Rate: 63  Oxygen Therapy SpO2: 100 % O2 Device: None (Room air) Pulse Oximetry Type: Intermittent Pain: Pain Assessment Pain Assessment: No/denies pain ADL: ADL Grooming: Setup Where Assessed-Grooming: Sitting at sink;Standing at sink Upper Body Bathing: Supervision/safety;Minimal cueing Where Assessed-Upper Body Bathing: Shower Lower Body Bathing: Minimal assistance;Minimal cueing Where Assessed-Lower Body Bathing: Shower Upper Body Dressing: Setup;Minimal cueing Where  Assessed-Upper Body Dressing: Sitting at sink Lower Body Dressing: Minimal cueing;Minimal assistance Where Assessed-Lower Body Dressing: Sitting at sink;Standing at sink Toileting: Minimal assistance Where Assessed-Toileting: Teacher, adult education: Minimal Dentist Method: Stand pivot;Ambulating Acupuncturist: Bedside commode;Grab bars Film/video editor: Insurance underwriter Method: Warden/ranger: Shower seat with back;Grab bars ADL Comments: Pt requires cues for intiation and sequencing. Pt with occasional Lt inattention, and is able to report frustrations with LUE functioning. Pt with h/o COPD and asthma, requires multiple rest breaks  See FIM for current functional status  Therapy/Group: Individual Therapy  [name] 09/13/2011, 12:29 PM

## 2011-09-13 NOTE — Progress Notes (Signed)
Patient ID: Cindy Abbott, female   DOB: [birth year], 78 y.o.   MRN: [id number]  Subjective/Complaints: Doing well, no dizziness, no CP or SOB. When am I going home?" Appreciate GI consult Review of Systems  Genitourinary:       Retention  All other systems reviewed and are negative.   Objective: Vital Signs: Blood pressure 144/47, pulse 59, temperature 98.2 F (36.8 C), temperature source Oral, resp. rate 18, height 5\' 5"  (1.651 m), weight 71.6 kg (157 lb 13.6 oz), SpO2 100.00%. No results found. Results for orders placed during the hospital encounter of 08/23/11 (from the past 72 hour(s))  CBC     Status: Abnormal   Collection Time   09/10/11  9:06 AM      Component Value Range Comment   WBC 6.5  4.0 - 10.5 K/uL    RBC 4.13  3.87 - 5.11 MIL/uL    Hemoglobin 10.7 (*) 12.0 - 15.0 g/dL    HCT 40.9 (*) 81.1 - 46.0 %    MCV 81.1  78.0 - 100.0 fL    MCH 25.9 (*) 26.0 - 34.0 pg    MCHC 31.9  30.0 - 36.0 g/dL    RDW 91.4 (*) 78.2 - 15.5 %    Platelets 227  150 - 400 K/uL   GLUCOSE, CAPILLARY     Status: Abnormal   Collection Time   09/10/11 11:20 AM      Component Value Range Comment   Glucose-Capillary 116 (*) 70 - 99 mg/dL    Comment 1 Notify RN     GLUCOSE, CAPILLARY     Status: Normal   Collection Time   09/10/11  4:29 PM      Component Value Range Comment   Glucose-Capillary 87  70 - 99 mg/dL    Comment 1 Notify RN     GLUCOSE, CAPILLARY     Status: Abnormal   Collection Time   09/10/11  9:07 PM      Component Value Range Comment   Glucose-Capillary 178 (*) 70 - 99 mg/dL    Comment 1 Notify RN     GLUCOSE, CAPILLARY     Status: Abnormal   Collection Time   09/11/11  7:22 AM      Component Value Range Comment   Glucose-Capillary 144 (*) 70 - 99 mg/dL    Comment 1 Notify RN     GLUCOSE, CAPILLARY     Status: Abnormal   Collection Time   09/11/11 11:13 AM      Component Value Range Comment   Glucose-Capillary 194 (*) 70 - 99 mg/dL   GLUCOSE, CAPILLARY     Status: Abnormal   Collection Time   09/11/11  4:44 PM      Component Value Range Comment   Glucose-Capillary 108 (*) 70 - 99 mg/dL   GLUCOSE, CAPILLARY     Status: Normal   Collection Time   09/11/11  8:44 PM      Component Value Range Comment   Glucose-Capillary 88  70 - 99 mg/dL    Comment 1 Notify RN     GLUCOSE, CAPILLARY     Status: Abnormal   Collection Time   09/12/11  7:33 AM      Component Value Range Comment   Glucose-Capillary 111 (*) 70 - 99 mg/dL   GLUCOSE, CAPILLARY     Status: Abnormal   Collection Time   09/12/11 11:55 AM      Component Value Range Comment   Glucose-Capillary  104 (*) 70 - 99 mg/dL   GLUCOSE, CAPILLARY     Status: Abnormal   Collection Time   09/12/11  4:09 PM      Component Value Range Comment   Glucose-Capillary 111 (*) 70 - 99 mg/dL   GLUCOSE, CAPILLARY     Status: Abnormal   Collection Time   09/12/11  8:32 PM      Component Value Range Comment   Glucose-Capillary 164 (*) 70 - 99 mg/dL    Comment 1 Notify RN     GLUCOSE, CAPILLARY     Status: Abnormal   Collection Time   09/13/11  7:19 AM      Component Value Range Comment   Glucose-Capillary 125 (*) 70 - 99 mg/dL    Comment 1 Notify RN        HEENT: normal Cardio: RRR Resp: CTA B/L GI: BS positive Extremity:  No Edema Skin:   Intact Neuro: Confused, Flat, Normal Sensory and Abnormal Motor 4/5 on Left side UE and LE Musc/Skel:  Normal Skin is quite dry  Assessment/Plan: 1. Functional deficits secondary to R ICA infarct with L HP, Mild L neglect which require 3+ hours per day of interdisciplinary therapy in a comprehensive inpatient rehab setting. Physiatrist is providing close team supervision and 24 hour management of active medical problems listed below. Physiatrist and rehab team continue to assess barriers to discharge/monitor patient progress toward functional and medical goals. FIM: FIM - Bathing Bathing Steps Patient Completed: Chest;Right Arm;Left Arm;Abdomen;Front perineal area;Buttocks;Right upper  leg;Left upper leg Bathing: 4: Min-Patient completes 8-9 54f 10 parts or 75+ percent  FIM - Upper Body Dressing/Undressing Upper body dressing/undressing steps patient completed: Thread/unthread right sleeve of pullover shirt/dresss;Thread/unthread left sleeve of pullover shirt/dress;Put head through opening of pull over shirt/dress;Pull shirt over trunk Upper body dressing/undressing: 5: Set-up assist to: Obtain clothing/put away FIM - Lower Body Dressing/Undressing Lower body dressing/undressing steps patient completed: Thread/unthread right underwear leg;Thread/unthread left underwear leg;Thread/unthread right pants leg;Thread/unthread left pants leg;Pull pants up/down Lower body dressing/undressing: 4: Min-Patient completed 75 plus % of tasks  FIM - Toileting Toileting steps completed by patient: Performs perineal hygiene Toileting Assistive Devices: Grab bar or rail for support Toileting: 2: Max-Patient completed 1 of 3 steps  FIM - Diplomatic Services operational officer Devices: Psychiatrist Transfers: 3-To toilet/BSC: Mod A (lift or lower assist);3-From toilet/BSC: Mod A (lift or lower assist)  FIM - Press photographer Assistive Devices: Arm rests Bed/Chair Transfer: 4: Bed > Chair or W/C: Min A (steadying Pt. > 75%);4: Chair or W/C > Bed: Min A (steadying Pt. > 75%)  FIM - Locomotion: Wheelchair Distance: 150 Locomotion: Wheelchair: 1: Total Assistance/staff pushes wheelchair (Pt<25%) FIM - Locomotion: Ambulation Locomotion: Ambulation Assistive Devices: Other (comment) (hall hand rail) Ambulation/Gait Assistance: Other (comment);1: +2 Total assist (Patient ambulated with HHA with mod assist x 2) Locomotion: Ambulation: 2: Travels 50 - 149 ft with moderate assistance (Pt: 50 - 74%)  Comprehension Comprehension Mode: Auditory Comprehension: 5-Follows basic conversation/direction: With no assist  Expression Expression Mode:  Verbal Expression: 5-Expresses basic 90% of the time/requires cueing < 10% of the time.  Social Interaction Social Interaction: 6-Interacts appropriately with others with medication or extra time (anti-anxiety, antidepressant).  Problem Solving Problem Solving: 3-Solves basic 50 - 74% of the time/requires cueing 25 - 49% of the time  Memory Memory: 2-Recognizes or recalls 25 - 49% of the time/requires cueing 51 - 75% of the time  Medical Problem List and Plan:  1. 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 DVT Prophylaxis/Anticoagulation: SCDs. Monitor for signs of DVT. No Lovenox therapy  3. Dysphagia. Dysphagia 1 diet with thin liquids as per modified barium swallow 08/23/2011. Monitor for any signs of aspiration. Followup speech therapy. . Monitor hydration  . Latest creatinine 1.18 4. Neuropsych: This patient is not capable of making decisions on her own behalf.  5. Diabetes mellitus. Controlled Hemoglobin A1c of 8.2. Lantus insulin 10 units each bedtime as well as NovoLog 10 units before lunch and supper and 5 units before breakfast . Hold parameters added to 10 unit dose to help avoid hypoglycemia. Generally her am cbg's have been better. Follow for now 6. Hypertension. Clonidine patch 0.2 mg weekly,.imdur 30 mg daily, Lopressor 25 mg twice a day. Monitor the increased mobility . Check orthostatics Patient denies any dizziness when up for therapies. No chest pain or shortness of breath 7. CAD status post pacemaker. Patient denies any chest pain. Continue aspirin therapy 8.  Anemia f/u CBC last Hgb was 9.5 on 8/20, down to 7.0 transfused, back to 9.4 on 9/4, 10.5 on 9/6 monitor CBC, will change to baby ASA  LOS (Days) 21 A FACE TO FACE EVALUATION WAS PERFORMED  Talise Sligh E 09/13/2011, 8:41 AM

## 2011-09-13 NOTE — Discharge Summary (Signed)
  Discharge summary job # [id number]

## 2011-09-13 NOTE — Progress Notes (Signed)
Occupational Therapy Session Note  Patient Details  Name: Cindy Abbott MRN: [id number] Date of Birth: 1922/09/04  Today's Date: 09/13/2011 Time: 1130-1200 Time Calculation (min): 30 min   Skilled Therapeutic Interventions/Progress Updates: Patient participated in [employer] today to help her safely manage task of self feeding.  Patient needed frequent cues to clear cheek of pocketed food.  Patient excited to go home tomorrow.  Family education still needs to be completed regarding meal time supervision.      Therapy Documentation Precautions:  Precautions Precautions: Fall Precaution Comments: Dys 1, Nectar thick liquids, TEDs donned OOB Restrictions Weight Bearing Restrictions: No General: Pain:  No report of pain      See FIM for current functional status  Therapy/Group: Group Therapy  Collier Salina 09/13/2011, 1:22 PM

## 2011-09-13 NOTE — Discharge Summary (Signed)
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  Sequential compression devices for deep venous thrombosis prophylaxis, dysphagia, diabetes mellitus, anemia, hypertension, Escherichia coli urinary tract infection resolved and coronary artery disease with pacemaker.  HISTORY OF PRESENT ILLNESS:  This is an 78 year old right-handed female with history of coronary artery disease with pacemaker and diabetes mellitus, who was admitted August 19, 2011 from [facility] with acute onset left-sided weakness and slurred speech.  Initial cranial CT scan showed age related atrophy without acute change.  The patient did receive tPA.  Carotid Dopplers with bilateral 40-59% ICA stenosis. Echocardiogram with ejection fraction of 70% without embolus.  CTA of the head with right internal carotid artery occlusion.  Neurology consulted, placed on aspirin therapy.  Urine study showed E. coli urinary tract infection, placed on Bactrim.  Hemoglobin A1c of 8.2 with insulin therapy initiated.  Bedside swallow evaluation placed on a dysphagia 1 nectar thick liquid diet.  The patient was admitted for a comprehensive rehab program.  PAST MEDICAL HISTORY:  See discharge diagnoses.  SOCIAL HISTORY:  Lives with son and assistance as needed.  One level home, 3 steps to entry.  FUNCTIONAL HISTORY:  Prior to admission was independent, retired.  She does not drive.  FUNCTIONAL STATUS:  Upon admission to rehab services was +2 total assist for sit to stand, +2 total assist to scoot to the head of the bed, ambulating 15 feet with a rolling  walker.  PHYSICAL EXAMINATION:  VITAL SIGNS:  Blood pressure 122/62, pulse 58, respirations 15, temperature 98.2. GENERAL: This was an alert female.  She was appropriate for age, date of birth, and place.  She does show some left-sided neglect and slurred speech but intelligible. LUNGS:  Clear to auscultation.  Cardiac rate controlled. ABDOMEN:  Soft, nontender.  Good bowel sounds. SKIN:  Warm and dry.  REHABILITATION HOSPITAL COURSE:  The patient was admitted to Inpatient Rehab Services with therapies initiated on a 3-hour daily basis consisting of physical therapy, occupational therapy, speech therapy, and rehabilitation nursing.  The following issues were addressed during the patient's rehabilitation stay pertaining to Ms. Defoor's right frontal and parietal thrombotic infarct.  The patient remained on aspirin therapy.  Sequential compression devices for DVT prophylaxis. She did receive followup by speech therapy for dysphagia related to her stroke.  Diet was now dysphagia 2 with thin liquids, which she was tolerating well.  She did complete a course of Bactrim for an E. coli urinary tract infection.  No dysuria or hematuria.  Blood pressure is well controlled on clonidine patch as well as Imdur and metoprolol.  She said no orthostatic hypotension and would follow up with her primary MD. History of diabetes mellitus with hemoglobin A1c of 8.2, maintained on insulin therapy.  She did have a history of coronary  artery disease with pacemaker which was without issue during her rehab stay.  Noted anemia 9.5-7.0.  Gastroenterology Services was consulted in relation to this anemia, was felt conservative care to be provided. She was transfused, felt her anemia was chronic.  She exhibited no bleeding episodes.  No plan to proceed with endoscopic evaluation unless overt significant bleeding. Discharge hemoglobin 10.1. The patient received weekly collaborative interdisciplinary team  conferences to discuss estimated length of stay, family teaching, and any barriers to her discharge.  She was continent of bowel and bladder, minimal assist bathing and dressing, minimal to steady assist grooming and standing at sink, minimum to moderate assist transfers, moderate assist overall for activity tolerance.  Her strength and endurance continued to improve.  Family teaching was completed with her son, plans to be discharged to home with ongoing therapies dictated as per [employer].  DISCHARGE MEDICATIONS: 1. Aspirin 81 mg daily. 2. Clonidine patch 0.2 mg weekly. 3. Lantus insulin 10 units at bedtime. 4. Imdur 30 mg daily. 5. Xalatan ophthalmic solution, 1 drop at bedtime. 6. Lopressor 25 mg b.i.d. 7. Protonix 40 mg daily. 8. Senokot tablets 1 at bedtime.  DIET:  Dysphagia 2 thin liquid diet.  SPECIAL INSTRUCTIONS:  The patient would follow up with Dr. Claudette Laws at the outpatient rehab center as advised.  Dr. Delia Heady, Neurology Services called for appointment in 1 month, Dr. Rob Bunting, Gastroenterology Services as needed. The patient also has a gastroenterologist that follows her at [facility] Follow up primary MD as advised, appointment to be made.     Mariam Dollar, P.A.   ______________________________ Erick Colace, M.D.    DA/MEDQ  D:  09/13/2011  T:  09/13/2011  Job:  [id number]  cc:   Erick Colace, M.D. Rachael Fee, MD Pramod P. Pearlean Brownie, MD

## 2011-09-13 NOTE — Significant Event (Signed)
Hypoglycemic Event  CBG: 53  Treatment: 15 GM carbohydrate snack  Symptoms: None  Follow-up CBG: Time:1710 CBG Result:110  Possible Reasons for Event: Unknown  Comments/MD notified:pt ate dinner and snack.    Cindy Abbott  Remember to initiate Hypoglycemia Order Set & complete

## 2011-09-13 NOTE — Progress Notes (Signed)
Occupational Therapy Discharge Summary  Patient Details  Name: Cindy Abbott MRN: [id number] Date of Birth: 03/29/1922  Today's Date: 09/13/2011  Patient has met 11 of 11 long term goals due to improved activity tolerance, improved balance, postural control, ability to compensate for deficits and functional use of  LEFT upper extremity.  Patient to discharge at [facility] Assist level.  Patient's care partner reports independence, however was never present for scheduled family education with OT, to provide the necessary physical and cognitive assistance at discharge.  Met with pt's son briefly prior to d/c and he reports ability to provide physical assistance.  He did attend 1-2 PT sessions and is competent to assist with transfers.  Reasons goals not met: N/A  Recommendation:  Patient will benefit from ongoing skilled OT services in home health setting to continue to advance functional skills in the area of BADL and Reduce care partner burden.  Equipment: tub bench and BSC  Reasons for discharge: treatment goals met and discharge from hospital  Patient/family agrees with progress made and goals achieved: Yes  OT Discharge Precautions/Restrictions   Fall risk Vital Signs Therapy Vitals Pulse Rate: 63  Oxygen Therapy SpO2: 100 % O2 Device: None (Room air) Pulse Oximetry Type: Intermittent Pain Pain Assessment Pain Assessment: No/denies pain ADL ADL Grooming: Setup Where Assessed-Grooming: Sitting at sink;Standing at sink Upper Body Bathing: Supervision/safety;Minimal cueing Where Assessed-Upper Body Bathing: Shower Lower Body Bathing: Minimal assistance;Minimal cueing Where Assessed-Lower Body Bathing: Shower Upper Body Dressing: Setup;Minimal cueing Where Assessed-Upper Body Dressing: Sitting at sink Lower Body Dressing: Minimal cueing;Minimal assistance Where Assessed-Lower Body Dressing: Sitting at sink;Standing at sink Toileting: Minimal assistance Where  Assessed-Toileting: Teacher, adult education: Minimal Dentist Method: Stand pivot;Ambulating Acupuncturist: Bedside commode;Grab bars Film/video editor: Insurance underwriter Method: Warden/ranger: Shower seat with back;Grab bars ADL Comments: Pt requires cues for intiation and sequencing. Pt with occasional Lt inattention, and is able to report frustrations with LUE functioning. Pt with h/o COPD and asthma, requires multiple rest breaks Cognition Overall Cognitive Status: Impaired Arousal/Alertness: Awake/alert Orientation Level: Oriented to person;Oriented to place;Oriented to situation Attention: Focused;Sustained Focused Attention: Appears intact Sustained Attention: Impaired Sustained Attention Impairment: Verbal basic;Functional basic Memory: Impaired Memory Impairment: Storage deficit;Retrieval deficit;Decreased recall of new information;Decreased short term memory Decreased Short Term Memory: Verbal basic;Functional basic Awareness: Impaired Awareness Impairment: Intellectual impairment Problem Solving: Impaired Problem Solving Impairment: Verbal basic;Functional basic Executive Function: Sequencing;Organizing;Decision Making;Self Monitoring;Self Correcting Sequencing: Impaired Sequencing Impairment: Verbal basic Organizing: Impaired Organizing Impairment: Verbal basic;Functional basic Decision Making: Impaired Decision Making Impairment: Verbal basic;Functional basic Self Monitoring: Impaired Self Monitoring Impairment: Verbal basic;Functional basic Self Correcting: Impaired Self Correcting Impairment: Verbal basic;Functional basic Behaviors: Perseveration Safety/Judgment: Impaired Sensation Sensation Light Touch: Appears Intact Stereognosis: Not tested Hot/Cold: Not tested Proprioception: Impaired by gross assessment Additional Comments: Requires cues for L hand placement, positioning on  grab bars and for foot position during standing/ gait Coordination Gross Motor Movements are Fluid and Coordinated: No Fine Motor Movements are Fluid and Coordinated: No Motor  Motor Motor: Hemiplegia Motor - Discharge Observations: L hemiplegia, continues to stand and sit in significant posterior pelvic tilt and thoracic flexion but leaning improved Mobility  Bed Mobility Bed Mobility: Supine to Sit;Sit to Supine Supine to Sit: 5: Supervision Supine to Sit Details (indicate cue type and reason): verbal cues for safety and to initiate and for sequencing Sit to Supine: 4: Min assist Sit to Supine - Details (indicate cue type  and reason): Assistance to lift LLE into bed and for repositioning on side in bed, flat bed, no rail  Trunk/Postural Assessment  Postural Control Postural Control: Deficits on evaluation (still requires cues for upright trunk sitting/standing)   See FIM for current functional status  [name] 09/13/2011, 12:30 PM

## 2011-09-14 ENCOUNTER — Encounter (HOSPITAL_COMMUNITY): Payer: Medicare Other | Admitting: Speech Pathology

## 2011-09-14 ENCOUNTER — Inpatient Hospital Stay (HOSPITAL_COMMUNITY): Payer: Medicare Other | Admitting: Occupational Therapy

## 2011-09-14 DIAGNOSIS — Z5189 Encounter for other specified aftercare: Secondary | ICD-10-CM

## 2011-09-14 DIAGNOSIS — G8110 Spastic hemiplegia affecting unspecified side: Secondary | ICD-10-CM

## 2011-09-14 DIAGNOSIS — I6330 Cerebral infarction due to thrombosis of unspecified cerebral artery: Secondary | ICD-10-CM

## 2011-09-14 LAB — GLUCOSE, CAPILLARY
Glucose-Capillary: 92 mg/dL (ref 70–99)
Glucose-Capillary: 136 mg/dL — ABNORMAL HIGH (ref 70–99)

## 2011-09-14 MED ORDER — METOPROLOL TARTRATE 25 MG PO TABS: 25.0000 mg | ORAL_TABLET | Freq: Two times a day (BID) | ORAL | Status: DC

## 2011-09-14 MED ORDER — INSULIN ASPART 100 UNIT/ML ~~LOC~~ SOLN: 5.0000 [IU] | Freq: Two times a day (BID) | SUBCUTANEOUS | Status: DC

## 2011-09-14 MED ORDER — PANTOPRAZOLE SODIUM 40 MG PO TBEC: 40.0000 mg | DELAYED_RELEASE_TABLET | Freq: Every day | ORAL | Status: DC

## 2011-09-14 MED ORDER — INSULIN ASPART 100 UNIT/ML ~~LOC~~ SOLN: 5.0000 [IU] | Freq: Every day | SUBCUTANEOUS | Status: DC

## 2011-09-14 MED ORDER — LATANOPROST 0.005 % OP SOLN: 1.0000 [drp] | Freq: Every day | OPHTHALMIC | Status: AC

## 2011-09-14 MED ORDER — INSULIN GLARGINE 100 UNIT/ML ~~LOC~~ SOLN: 10.0000 [IU] | Freq: Every day | SUBCUTANEOUS | Status: DC

## 2011-09-14 NOTE — Progress Notes (Addendum)
Patient ID: Cindy Abbott, female   DOB: Feb 21, 1922, 78 y.o.   MRN: [id number]  Subjective/Complaints:  No C/os Review of Systems  Genitourinary:       Retention  All other systems reviewed and are negative.   Objective: Vital Signs: Blood pressure 126/46, pulse 57, temperature 98.1 F (36.7 C), temperature source Oral, resp. rate 19, height 5\' 5"  (1.651 m), weight 71.6 kg (157 lb 13.6 oz), SpO2 99.00%. No results found. Results for orders placed during the hospital encounter of 08/23/11 (from the past 72 hour(s))  GLUCOSE, CAPILLARY     Status: Abnormal   Collection Time   09/11/11 11:13 AM      Component Value Range Comment   Glucose-Capillary 194 (*) 70 - 99 mg/dL   GLUCOSE, CAPILLARY     Status: Abnormal   Collection Time   09/11/11  4:44 PM      Component Value Range Comment   Glucose-Capillary 108 (*) 70 - 99 mg/dL   GLUCOSE, CAPILLARY     Status: Normal   Collection Time   09/11/11  8:44 PM      Component Value Range Comment   Glucose-Capillary 88  70 - 99 mg/dL    Comment 1 Notify RN     GLUCOSE, CAPILLARY     Status: Abnormal   Collection Time   09/12/11  7:33 AM      Component Value Range Comment   Glucose-Capillary 111 (*) 70 - 99 mg/dL   GLUCOSE, CAPILLARY     Status: Abnormal   Collection Time   09/12/11 11:55 AM      Component Value Range Comment   Glucose-Capillary 104 (*) 70 - 99 mg/dL   GLUCOSE, CAPILLARY     Status: Abnormal   Collection Time   09/12/11  4:09 PM      Component Value Range Comment   Glucose-Capillary 111 (*) 70 - 99 mg/dL   GLUCOSE, CAPILLARY     Status: Abnormal   Collection Time   09/12/11  8:32 PM      Component Value Range Comment   Glucose-Capillary 164 (*) 70 - 99 mg/dL    Comment 1 Notify RN     GLUCOSE, CAPILLARY     Status: Abnormal   Collection Time   09/13/11  7:19 AM      Component Value Range Comment   Glucose-Capillary 125 (*) 70 - 99 mg/dL    Comment 1 Notify RN     CBC     Status: Abnormal   Collection Time   09/13/11 10:33 AM     Component Value Range Comment   WBC 6.0  4.0 - 10.5 K/uL    RBC 3.92  3.87 - 5.11 MIL/uL    Hemoglobin 10.1 (*) 12.0 - 15.0 g/dL    HCT 40.9 (*) 81.1 - 46.0 %    MCV 81.6  78.0 - 100.0 fL    MCH 25.8 (*) 26.0 - 34.0 pg    MCHC 31.6  30.0 - 36.0 g/dL    RDW 91.4 (*) 78.2 - 15.5 %    Platelets 204  150 - 400 K/uL   GLUCOSE, CAPILLARY     Status: Abnormal   Collection Time   09/13/11 11:16 AM      Component Value Range Comment   Glucose-Capillary 127 (*) 70 - 99 mg/dL    Comment 1 Notify RN     GLUCOSE, CAPILLARY     Status: Abnormal   Collection Time   09/13/11  4:39 PM      Component Value Range Comment   Glucose-Capillary 53 (*) 70 - 99 mg/dL   GLUCOSE, CAPILLARY     Status: Abnormal   Collection Time   09/13/11  5:11 PM      Component Value Range Comment   Glucose-Capillary 110 (*) 70 - 99 mg/dL   GLUCOSE, CAPILLARY     Status: Abnormal   Collection Time   09/13/11  9:05 PM      Component Value Range Comment   Glucose-Capillary 212 (*) 70 - 99 mg/dL   GLUCOSE, CAPILLARY     Status: Normal   Collection Time   09/14/11  7:19 AM      Component Value Range Comment   Glucose-Capillary 92  70 - 99 mg/dL    Comment 1 Notify RN        HEENT: normal Cardio: RRR Resp: CTA B/L GI: BS positive Extremity:  No Edema Skin:   Intact Neuro: Confused, Flat, Normal Sensory and Abnormal Motor 4/5 on Left side UE and LE Musc/Skel:  Normal Skin is quite dry  Assessment/Plan: 1. Functional deficits secondary to R ICA infarct with L HP, Mild L neglect stable for D/C after family training.  See D/C summaryFIM: FIM - Bathing Bathing Steps Patient Completed: Chest;Right Arm;Left Arm;Abdomen;Front perineal area;Buttocks;Right upper leg;Left upper leg Bathing: 4: Min-Patient completes 8-9 40f 10 parts or 75+ percent  FIM - Upper Body Dressing/Undressing Upper body dressing/undressing steps patient completed: Thread/unthread right sleeve of pullover shirt/dresss;Thread/unthread left sleeve of  pullover shirt/dress;Put head through opening of pull over shirt/dress;Pull shirt over trunk Upper body dressing/undressing: 5: Set-up assist to: Obtain clothing/put away FIM - Lower Body Dressing/Undressing Lower body dressing/undressing steps patient completed: Thread/unthread right underwear leg;Thread/unthread left underwear leg;Pull underwear up/down;Thread/unthread right pants leg;Thread/unthread left pants leg;Pull pants up/down;Don/Doff left sock;Don/Doff right sock Lower body dressing/undressing: 4: Steadying Assist  FIM - Toileting Toileting steps completed by patient: Adjust clothing prior to toileting;Performs perineal hygiene;Adjust clothing after toileting Toileting Assistive Devices: Grab bar or rail for support Toileting: 4: Steadying assist  FIM - Diplomatic Services operational officer Devices: Grab bars Toilet Transfers: 4-To toilet/BSC: Min A (steadying Pt. > 75%);4-From toilet/BSC: Min A (steadying Pt. > 75%)  FIM - Banker Devices: Walker;Arm rests Bed/Chair Transfer: 5: Supine > Sit: Supervision (verbal cues/safety issues);4: Sit > Supine: Min A (steadying pt. > 75%/lift 1 leg);4: Bed > Chair or W/C: Min A (steadying Pt. > 75%);4: Chair or W/C > Bed: Min A (steadying Pt. > 75%)  FIM - Locomotion: Wheelchair Distance: 150 Locomotion: Wheelchair: 1: Total Assistance/staff pushes wheelchair (Pt<25%) FIM - Locomotion: Ambulation Locomotion: Ambulation Assistive Devices: Other (comment) (HHA) Ambulation/Gait Assistance: 3: Mod assist Locomotion: Ambulation: 1: Travels less than 50 ft with moderate assistance (Pt: 50 - 74%)  Comprehension Comprehension Mode: Auditory Comprehension: 4-Understands basic 75 - 89% of the time/requires cueing 10 - 24% of the time  Expression Expression Mode: Verbal Expression: 4-Expresses basic 75 - 89% of the time/requires cueing 10 - 24% of the time. Needs helper to occlude trach/needs to  repeat words.  Social Interaction Social Interaction: 6-Interacts appropriately with others with medication or extra time (anti-anxiety, antidepressant).  Problem Solving Problem Solving: 3-Solves basic 50 - 74% of the time/requires cueing 25 - 49% of the time  Memory Memory: 3-Recognizes or recalls 50 - 74% of the time/requires cueing 25 - 49% of the time  Medical Problem List and Plan:  1. Right frontal and  parietal thrombotic infarct with left hemiparesthesias  2. DVT Prophylaxis/Anticoagulation: SCDs. Monitor for signs of DVT. No Lovenox therapy  3. Dysphagia. Dysphagia 1 diet with thin liquids as per modified barium swallow 08/23/2011. Monitor for any signs of aspiration. Followup speech therapy. . Monitor hydration  . Latest creatinine 1.18 4. Neuropsych: This patient is not capable of making decisions on her own behalf.  5. Diabetes mellitus. Controlled Hemoglobin A1c of 8.2. Lantus insulin 10 units each bedtime as well as NovoLog 10 units before lunch and supper and 5 units before breakfast . Hold parameters added to 10 unit dose to help avoid hypoglycemia. Generally her am cbg's have been better. Follow for now 6. Hypertension. Clonidine patch 0.2 mg weekly,.imdur 30 mg daily, Lopressor 25 mg twice a day. Monitor the increased mobility . Check orthostatics Patient denies any dizziness when up for therapies. No chest pain or shortness of breath 7. CAD status post pacemaker. Patient denies any chest pain. Continue aspirin therapy 8.  Anemia f/u CBC last Hgb was 9.5 on 8/20, down to 7.0 transfused, back to 9.4 on 9/4, 10.5 on 9/6 monitor CBC, will change to baby ASA  LOS (Days) 22 A FACE TO FACE EVALUATION WAS PERFORMED  Monita Swier E 09/14/2011, 8:18 AM

## 2011-09-14 NOTE — Plan of Care (Signed)
Problem: RH KNOWLEDGE DEFICIT Goal: RH STG INCREASE KNOWLEDGE OF DIABETES Patient and/or Caregiver will be able to verbalize medication used to manage diabetes  Outcome: Completed/Met Date Met:  09/14/11 Son is able to verbalize

## 2011-09-14 NOTE — Progress Notes (Signed)
Social Work Patient ID: Cindy Abbott, female   DOB: 04/14/1922, 78 y.o.   MRN: [id number] Son did not show up for OT session, was here for SPT and went over pt's diet.  Dan-PA going over discharge instructions And pt ready for discharge.  Son aware pt requires 24 hour physical care and not to leave her alone at home.

## 2011-09-14 NOTE — Progress Notes (Signed)
Patient and son received written and verbal discharge instructions from [name], [place]. Denies any questions or concerns, aware of follow up appointments, Jesusita Oka discussed with son care for left 5th toe. Patient denies any pain, personal belongings packed by staff, patient taken down to private vehicle via wheelchair by NT. [place], Natividad Schlosser Elon Jester

## 2011-09-14 NOTE — Progress Notes (Signed)
Social Work Discharge Note Discharge Note  The overall goal for the admission was met for:   Discharge location: Yes-HOME WITH SON AND HIS FAMILY  Length of Stay: Yes-22 DAYS  Discharge activity level: Yes-MIN LEVEL  Home/community participation: Yes  Services provided included: MD, RD, PT, OT, SLP, RN, TR, Pharmacy and SW  Financial Services: Medicare  Follow-up services arranged: Home Health: [facility]-PT,OT,RN, AIDE, DME: [facility]-WHEELCHAIR, [employer]LL[employer] WALKER, BSC, TUB BENCH and Patient/Family has no preference for HH/DME agencies  Comments (or additional information):SON HERE DAILY BUT ONLY ATTENDED THERAPY ONE TIME-AWARE OF PT'S NEED FOR 24 HOUR CARE.   Patient/Family verbalized understanding of follow-up arrangements: Yes  Individual responsible for coordination of the follow-up plan: LAWRENCE-SON  Confirmed correct DME delivered: Lucy Chris 09/14/2011    Lucy Chris

## 2011-09-14 NOTE — Progress Notes (Signed)
Called to room by NT, patient's left 5 th toe nail pulled away from toe, nail is long, small amount of bleeding noted. Called [name], PA to room, assessed nail and cut nail shorter. This Clinical research associate then cleaned nail with NS applied 2x2 dry gauze and tape to protect the nail per verbal order from [name], PA, patient tolerated well.Cindy Abbott, Cindy Kable Elon Abbott

## 2011-09-14 NOTE — Progress Notes (Signed)
Occupational Therapy Note  Patient Details  Name: Cindy Abbott MRN: [id number] Date of Birth: [birth year] Today's Date: 09/14/2011  Pt scheduled for family ed with son prior to d/c however he did not arrive for scheduled time.  Pt already dressed and all goals met, so session focus to be on hands on transfers with son.  Son did arrive with 3 minutes left in scheduled time and reports no questions for this therapist.  He reports understanding of transfer technique and did not want to practice at this time.  Formal family education never completed as pt's son missed 2 scheduled family ed/therapy sessions.   [name] 09/14/2011, 12:19 PM

## 2011-09-14 NOTE — Progress Notes (Signed)
Speech Language Pathology Daily Session Note  Patient Details  Name: Patriciann Becht MRN: [id number] Date of Birth: 1922-08-27  Today's Date: 09/14/2011 Time: 1045-1100 Time Calculation (min): 15 min  Short Term Goals: Week 3: SLP Short Term Goal 1 (Week 3): Patient will consume Dys.2 textures and thin liquids with supervision level cues to utilize compensatory strategies to prevent overt s/s of aspiration. SLP Short Term Goal 2 (Week 3): Patient will consume trials of Dys.3 textures with mod assist verbal cues for efficient mastication and to monitor oral residue. SLP Short Term Goal 3 (Week 3): Patient will attend to left upper extremity with min assist semantic cues during functional activities.  SLP Short Term Goal 4 (Week 3): Patient will attend to left of environment with supervision level semantic cues during functional tasks. SLP Short Term Goal 5 (Week 3): Patient will utilize external aids for orientation with min assist semantic cues  Skilled Therapeutic Interventions: Session focused on educating patient and son regarding focus on speech-language therapy session and addressing both oral and pharyngeal dysphagia goals.  Son verbalized understanding and was provided with a handout regarding textures, liquids and use of safe swallow compensatory strategies.  SLP services are still warranted to address dysphagia in a home health setting.     Pain Pain Assessment Pain Assessment: No/denies pain  Therapy/Group: Individual Therapy  Charlane Ferretti., CCC-SLP [phone]  Severus Brodzinski 09/14/2011, 11:30 AM

## 2011-09-23 ENCOUNTER — Telehealth: Payer: Self-pay | Admitting: Physical Medicine & Rehabilitation

## 2011-09-23 NOTE — Telephone Encounter (Signed)
Everything has been situated per her son.  Advised to follow up with pcp regarding blood pressure.

## 2011-09-23 NOTE — Telephone Encounter (Signed)
Please call.  It is VERY important.

## 2011-10-07 ENCOUNTER — Encounter (HOSPITAL_COMMUNITY): Payer: Self-pay | Admitting: Physical Medicine and Rehabilitation

## 2011-10-07 ENCOUNTER — Emergency Department (HOSPITAL_COMMUNITY): Payer: Medicare Other

## 2011-10-07 ENCOUNTER — Emergency Department (HOSPITAL_COMMUNITY)
Admission: EM | Admit: 2011-10-07 | Discharge: 2011-10-07 | Disposition: A | Discharge status: Home / Self Care | Payer: Medicare Other | Attending: Emergency Medicine | Admitting: Emergency Medicine

## 2011-10-07 DIAGNOSIS — W19XXXA Unspecified fall, initial encounter: Secondary | ICD-10-CM | POA: Insufficient documentation

## 2011-10-07 DIAGNOSIS — R4182 Altered mental status, unspecified: Secondary | ICD-10-CM | POA: Insufficient documentation

## 2011-10-07 DIAGNOSIS — R410 Disorientation, unspecified: Secondary | ICD-10-CM

## 2011-10-07 DIAGNOSIS — M542 Cervicalgia: Secondary | ICD-10-CM | POA: Insufficient documentation

## 2011-10-07 DIAGNOSIS — R51 Headache: Secondary | ICD-10-CM | POA: Insufficient documentation

## 2011-10-07 DIAGNOSIS — Z951 Presence of aortocoronary bypass graft: Secondary | ICD-10-CM | POA: Insufficient documentation

## 2011-10-07 DIAGNOSIS — E119 Type 2 diabetes mellitus without complications: Secondary | ICD-10-CM | POA: Insufficient documentation

## 2011-10-07 DIAGNOSIS — Z794 Long term (current) use of insulin: Secondary | ICD-10-CM | POA: Insufficient documentation

## 2011-10-07 DIAGNOSIS — T07XXXA Unspecified multiple injuries, initial encounter: Secondary | ICD-10-CM | POA: Insufficient documentation

## 2011-10-07 DIAGNOSIS — Y92009 Unspecified place in unspecified non-institutional (private) residence as the place of occurrence of the external cause: Secondary | ICD-10-CM | POA: Insufficient documentation

## 2011-10-07 LAB — URINALYSIS, ROUTINE W REFLEX MICROSCOPIC
Bilirubin Urine: NEGATIVE
Ketones, ur: NEGATIVE mg/dL
Nitrite: NEGATIVE
Protein, ur: NEGATIVE mg/dL
pH: 6.5 (ref 5.0–8.0)

## 2011-10-07 LAB — COMPREHENSIVE METABOLIC PANEL
ALT: 7 U/L (ref 0–35)
AST: 16 U/L (ref 0–37)
Albumin: 2.8 g/dL — ABNORMAL LOW (ref 3.5–5.2)
Alkaline Phosphatase: 55 U/L (ref 39–117)
BUN: 9 mg/dL (ref 6–23)
CO2: 27 mEq/L (ref 19–32)
Calcium: 9.1 mg/dL (ref 8.4–10.5)
Chloride: 104 mEq/L (ref 96–112)
Creatinine, Ser: 0.98 mg/dL (ref 0.50–1.10)
GFR calc Af Amer: 58 mL/min — ABNORMAL LOW (ref >90)
GFR calc non Af Amer: 50 mL/min — ABNORMAL LOW (ref >90)
Glucose, Bld: 189 mg/dL — ABNORMAL HIGH (ref 70–99)
Potassium: 3.7 mEq/L (ref 3.5–5.1)
Sodium: 140 mEq/L (ref 135–145)
Total Bilirubin: 1.6 mg/dL — ABNORMAL HIGH (ref 0.3–1.2)
Total Protein: 5.9 g/dL — ABNORMAL LOW (ref 6.0–8.3)

## 2011-10-07 LAB — CBC WITH DIFFERENTIAL/PLATELET
Basophils Absolute: 0 10*3/uL (ref 0.0–0.1)
Basophils Relative: 0 % (ref 0–1)
Eosinophils Absolute: 0.1 10*3/uL (ref 0.0–0.7)
Eosinophils Relative: 2 % (ref 0–5)
HCT: 32 % — ABNORMAL LOW (ref 36.0–46.0)
Hemoglobin: 10.3 g/dL — ABNORMAL LOW (ref 12.0–15.0)
Lymphocytes Relative: 23 % (ref 12–46)
Lymphs Abs: 1.5 10*3/uL (ref 0.7–4.0)
MCH: 25.9 pg — ABNORMAL LOW (ref 26.0–34.0)
MCHC: 32.2 g/dL (ref 30.0–36.0)
MCV: 80.6 fL (ref 78.0–100.0)
Monocytes Absolute: 0.5 10*3/uL (ref 0.1–1.0)
Monocytes Relative: 8 % (ref 3–12)
Neutro Abs: 4.3 10*3/uL (ref 1.7–7.7)
Neutrophils Relative %: 68 % (ref 43–77)
Platelets: 133 10*3/uL — ABNORMAL LOW (ref 150–400)
RBC: 3.97 MIL/uL (ref 3.87–5.11)
RDW: 19.4 % — ABNORMAL HIGH (ref 11.5–15.5)
WBC: 6.4 10*3/uL (ref 4.0–10.5)

## 2011-10-07 LAB — URINE MICROSCOPIC-ADD ON

## 2011-10-07 LAB — POCT I-STAT TROPONIN I
Troponin i, poc: 0.03 ng/mL (ref 0.00–0.08)
Troponin i, poc: 0.03 ng/mL (ref 0.00–0.08)

## 2011-10-07 NOTE — ED Notes (Signed)
Patient was given food

## 2011-10-07 NOTE — ED Notes (Addendum)
Pt presents to department for evaluation of altered mental status, fall and generalized weakness. Per son pt had stroke x6 weeks ago, L sided facial droop and L sided weakness noted from previous stroke. Son now states she is slower to respond and unable to call out for help. Also states she fell several days ago while walking to bathroom. Able to move all extremities at the time. She is confused, able to answer some questions appropriately.

## 2011-10-07 NOTE — ED Provider Notes (Cosign Needed)
History     CSN: [id number]  Arrival date & time 10/07/11  1324   First MD Initiated Contact with Patient 10/07/11 1427      Chief Complaint  Patient presents with  . Altered Mental Status  . Fall    (Consider location/radiation/quality/duration/timing/severity/associated sxs/prior treatment) HPI  78 y.o. y/o female s/p CVA x6weeks ago INAD accompanied by her son who she lives with who explains that he is the sole caregiver and that his mother is falling because she is hesitant to call him to help to to the bathroom. She fell in the middle of the night x2 days ago and urinated on herself with unknown LOC. Denies chest pain, palpations, N/V/D, fever.  Other than the falls and hesitancy to ask for help to the bathroom patient is mentating at her normal level.  Past Medical History  Diagnosis Date  . Coronary artery disease   . Diabetes mellitus   . Pacemaker   . Asthma   . Stroke, acute, thrombotic 08/19/2011    Past Surgical History  Procedure Date  . Cardiac surgery   . Coronary angioplasty with stent placement   . Coronary artery bypass graft   . Abdominal hysterectomy   . Colon polpys     No family history on file.  History  Substance Use Topics  . Smoking status: Never Smoker   . Smokeless tobacco: Not on file  . Alcohol Use: No    OB History    Grav Para Term Preterm Abortions TAB SAB Ect Mult Living                  Review of Systems  Constitutional: Negative for fever.  Respiratory: Negative for shortness of breath.   Cardiovascular: Negative for chest pain.  Gastrointestinal: Negative for nausea, vomiting, abdominal pain and diarrhea.  Psychiatric/Behavioral: Positive for confusion.  All other systems reviewed and are negative.    Allergies  Lisinopril  Home Medications   Current Outpatient Rx  Name Route Sig Dispense Refill  . ASPIRIN EC 81 MG PO TBEC Oral Take 81 mg by mouth every morning.    Marland Kitchen CLONIDINE HCL 0.2 MG/24HR TD PTWK  Transdermal Place 1 patch (0.2 mg total) onto the skin once a week. 4 patch 1  . INSULIN ASPART 100 UNIT/ML [place] SOLN Subcutaneous Inject 5 Units into the skin daily before breakfast. 1 vial 1  . INSULIN ASPART 100 UNIT/ML [place] SOLN Subcutaneous Inject 5 Units into the skin 2 (two) times daily before lunch and supper. 1 vial 1  . INSULIN GLARGINE 100 UNIT/ML [place] SOLN Subcutaneous Inject 10 Units into the skin at bedtime. 10 mL 1  . ISOSORBIDE MONONITRATE ER 30 MG PO TB24 Oral Take 30 mg by mouth daily.    Marland Kitchen LATANOPROST 0.005 % OP SOLN Both Eyes Place 1 drop into both eyes at bedtime. 2.5 mL 1  . METOPROLOL TARTRATE 25 MG PO TABS Oral Take 1 tablet (25 mg total) by mouth 2 (two) times daily. 60 tablet 1  . PANTOPRAZOLE SODIUM 40 MG PO TBEC Oral Take 1 tablet (40 mg total) by mouth daily with breakfast. 30 tablet 1    BP 168/78  Pulse 60  Temp 98.6 F (37 C) (Oral)  Resp 19  SpO2 97%  Physical Exam  Nursing note and vitals reviewed. Constitutional: She is oriented to person, place, and time. She appears well-developed and well-nourished. No distress.  HENT:  Head: Normocephalic.  Eyes: Conjunctivae normal and EOM are normal.  Cardiovascular: Normal rate and regular rhythm.   Pulmonary/Chest: Effort normal and breath sounds normal. No stridor. No respiratory distress. She has no wheezes. She has no rales. She exhibits no tenderness.  Abdominal: Soft. Bowel sounds are normal.  Musculoskeletal: Normal range of motion.  Neurological: She is alert and oriented to person, place, and time.  Psychiatric: She has a normal mood and affect.    ED Course  Procedures (including critical care time)  Labs Reviewed  CBC WITH DIFFERENTIAL - Abnormal; Notable for the following:    Hemoglobin 10.3 (*)     HCT 32.0 (*)     MCH 25.9 (*)     RDW 19.4 (*)     Platelets 133 (*)     All other components within normal limits  POCT I-STAT TROPONIN I  COMPREHENSIVE METABOLIC PANEL  URINALYSIS, ROUTINE W  REFLEX MICROSCOPIC   Dg Chest 1 View  10/07/2011  *RADIOLOGY REPORT*  Clinical Data: Confusion.  Status post fall.  CHEST - 1 VIEW  Comparison: PA and lateral chest 08/25/2011 .  Findings: The patient is status post CABG.  There is marked cardiomegaly.  Mild blunting of the right costophrenic angle is compatible with a tiny effusion.  No evidence of left pleural effusion.  Lungs clear.  No pneumothorax.  IMPRESSION: Cardiomegaly and tiny right pleural effusion.   Original Report Authenticated By: Bernadene Bell. Maricela Curet, M.D.    Ct Head Wo Contrast  10/07/2011  *RADIOLOGY REPORT*  Clinical Data:  Status post fall.  History of stroke.  CT HEAD WITHOUT CONTRAST CT CERVICAL SPINE WITHOUT CONTRAST  Technique:  Multidetector CT imaging of the head and cervical spine was performed following the standard protocol without intravenous contrast.  Multiplanar CT image reconstructions of the cervical spine were also generated.  Comparison:  Head CT scan 08/20/2011.  CT HEAD  Findings: There is cortical atrophy and chronic microvascular ischemic change.  No evidence of acute abnormality including infarction, hemorrhage, mass lesion, mass effect, midline shift or abnormal extra-axial fluid collection is identified.  The calvarium is intact.  Carotid atherosclerosis is noted.  No pneumocephalus or hydrocephalus.  IMPRESSION: No acute finding.  Atrophy and chronic microvascular ischemic change.  CT CERVICAL SPINE  Findings: No fracture or subluxation of the cervical spine is identified.  Extensive carotid atherosclerosis is noted.  Lung apices are clear.  Scattered facet degenerative change is identified.  IMPRESSION: No acute finding.   Original Report Authenticated By: Bernadene Bell. Maricela Curet, M.D.    Ct Cervical Spine Wo Contrast  10/07/2011  *RADIOLOGY REPORT*  Clinical Data:  Status post fall.  History of stroke.  CT HEAD WITHOUT CONTRAST CT CERVICAL SPINE WITHOUT CONTRAST  Technique:  Multidetector CT imaging of the head and  cervical spine was performed following the standard protocol without intravenous contrast.  Multiplanar CT image reconstructions of the cervical spine were also generated.  Comparison:  Head CT scan 08/20/2011.  CT HEAD  Findings: There is cortical atrophy and chronic microvascular ischemic change.  No evidence of acute abnormality including infarction, hemorrhage, mass lesion, mass effect, midline shift or abnormal extra-axial fluid collection is identified.  The calvarium is intact.  Carotid atherosclerosis is noted.  No pneumocephalus or hydrocephalus.  IMPRESSION: No acute finding.  Atrophy and chronic microvascular ischemic change.  CT CERVICAL SPINE  Findings: No fracture or subluxation of the cervical spine is identified.  Extensive carotid atherosclerosis is noted.  Lung apices are clear.  Scattered facet degenerative change is identified.  IMPRESSION: No  acute finding.   Original Report Authenticated By: Bernadene Bell. Maricela Curet, M.D.     Date: 10/07/2011  Rate: 60  Rhythm: Accelerated ventricular escape rhythm  QRS Axis: left  Intervals: No PR widened QRS  ST/T Wave abnormalities: nonspecific ST/T changes  Conduction Disutrbances:right bundle branch block  Narrative Interpretation:   Old EKG Reviewed: unchanged Rhythm is no longer paced.  1. Confusion       MDM  78 y.o. -year-old female brought in by her son who is complaining of alteration in her mental status however when questioned further he is really saying that she is not calling him for help using the restroom in the middle of the night. When questioned directly the son states that she is mentating at her baseline.  Patient did have a fall with unknown loss of consciousness several days ago. CT head and C-spine are normal.  Blood work is unremarkable. EKG and troponin are also unremarkable. Urinalysis shows small leukocytes, I will culture the urine.  Wynetta Emery, PA-C 10/07/11 2050

## 2011-10-07 NOTE — ED Notes (Signed)
Per Pt's son.  Pt attempted to get out of bed two days ago and fell.  States, "It's like she's not talking as much.  She used to tell us when she had to go to the bathroom.  I'd ask her every two hours.  But now she just tries to do things on her own now."  Pt has recently been discharged out of a rehab facility from having a stroke.

## 2011-10-08 LAB — URINE CULTURE

## 2011-10-15 ENCOUNTER — Inpatient Hospital Stay: Payer: Medicare Other | Admitting: Physical Medicine & Rehabilitation

## 2011-10-15 ENCOUNTER — Encounter: Payer: Medicare Other | Attending: Physical Medicine & Rehabilitation

## 2012-01-05 DIAGNOSIS — C801 Malignant (primary) neoplasm, unspecified: Secondary | ICD-10-CM

## 2012-01-05 HISTORY — DX: Malignant (primary) neoplasm, unspecified: C80.1

## 2012-01-11 ENCOUNTER — Emergency Department (HOSPITAL_COMMUNITY)
Admission: EM | Admit: 2012-01-11 | Discharge: 2012-01-12 | Disposition: A | Discharge status: Home / Self Care | Payer: Medicare Other | Attending: Emergency Medicine | Admitting: Emergency Medicine

## 2012-01-11 ENCOUNTER — Emergency Department (HOSPITAL_COMMUNITY): Payer: Medicare Other

## 2012-01-11 ENCOUNTER — Encounter (HOSPITAL_COMMUNITY): Payer: Self-pay | Admitting: *Deleted

## 2012-01-11 DIAGNOSIS — E119 Type 2 diabetes mellitus without complications: Secondary | ICD-10-CM | POA: Insufficient documentation

## 2012-01-11 DIAGNOSIS — Z8673 Personal history of transient ischemic attack (TIA), and cerebral infarction without residual deficits: Secondary | ICD-10-CM | POA: Insufficient documentation

## 2012-01-11 DIAGNOSIS — J45909 Unspecified asthma, uncomplicated: Secondary | ICD-10-CM | POA: Insufficient documentation

## 2012-01-11 DIAGNOSIS — Z951 Presence of aortocoronary bypass graft: Secondary | ICD-10-CM | POA: Insufficient documentation

## 2012-01-11 DIAGNOSIS — Z9861 Coronary angioplasty status: Secondary | ICD-10-CM | POA: Insufficient documentation

## 2012-01-11 DIAGNOSIS — R4182 Altered mental status, unspecified: Secondary | ICD-10-CM | POA: Insufficient documentation

## 2012-01-11 DIAGNOSIS — Z950 Presence of cardiac pacemaker: Secondary | ICD-10-CM | POA: Insufficient documentation

## 2012-01-11 DIAGNOSIS — Z794 Long term (current) use of insulin: Secondary | ICD-10-CM | POA: Insufficient documentation

## 2012-01-11 DIAGNOSIS — I2510 Atherosclerotic heart disease of native coronary artery without angina pectoris: Secondary | ICD-10-CM | POA: Insufficient documentation

## 2012-01-11 DIAGNOSIS — Z79899 Other long term (current) drug therapy: Secondary | ICD-10-CM | POA: Insufficient documentation

## 2012-01-11 DIAGNOSIS — Z7982 Long term (current) use of aspirin: Secondary | ICD-10-CM | POA: Insufficient documentation

## 2012-01-11 LAB — CBC WITH DIFFERENTIAL/PLATELET
Basophils Absolute: 0 10*3/uL (ref 0.0–0.1)
Basophils Relative: 0 % (ref 0–1)
Eosinophils Absolute: 0.2 10*3/uL (ref 0.0–0.7)
Eosinophils Relative: 2 % (ref 0–5)
HCT: 31 % — ABNORMAL LOW (ref 36.0–46.0)
Hemoglobin: 10 g/dL — ABNORMAL LOW (ref 12.0–15.0)
Lymphocytes Relative: 19 % (ref 12–46)
Lymphs Abs: 1.4 10*3/uL (ref 0.7–4.0)
MCH: 26.5 pg (ref 26.0–34.0)
MCHC: 32.3 g/dL (ref 30.0–36.0)
MCV: 82 fL (ref 78.0–100.0)
Monocytes Absolute: 0.4 10*3/uL (ref 0.1–1.0)
Monocytes Relative: 5 % (ref 3–12)
Neutro Abs: 5.3 10*3/uL (ref 1.7–7.7)
Neutrophils Relative %: 74 % (ref 43–77)
Platelets: 189 10*3/uL (ref 150–400)
RBC: 3.78 MIL/uL — ABNORMAL LOW (ref 3.87–5.11)
RDW: 16 % — ABNORMAL HIGH (ref 11.5–15.5)
WBC: 7.2 10*3/uL (ref 4.0–10.5)

## 2012-01-11 LAB — URINE MICROSCOPIC-ADD ON

## 2012-01-11 LAB — URINALYSIS, ROUTINE W REFLEX MICROSCOPIC
Bilirubin Urine: NEGATIVE
Glucose, UA: 500 mg/dL — AB
Ketones, ur: NEGATIVE mg/dL
Leukocytes, UA: NEGATIVE
Protein, ur: 30 mg/dL — AB
pH: 6 (ref 5.0–8.0)

## 2012-01-11 LAB — BASIC METABOLIC PANEL
BUN: 12 mg/dL (ref 6–23)
CO2: 27 mEq/L (ref 19–32)
Calcium: 9.6 mg/dL (ref 8.4–10.5)
Chloride: 100 mEq/L (ref 96–112)
Creatinine, Ser: 0.82 mg/dL (ref 0.50–1.10)
GFR calc Af Amer: 72 mL/min — ABNORMAL LOW (ref >90)
GFR calc non Af Amer: 63 mL/min — ABNORMAL LOW (ref >90)
Glucose, Bld: 238 mg/dL — ABNORMAL HIGH (ref 70–99)
Potassium: 4.1 mEq/L (ref 3.5–5.1)
Sodium: 138 mEq/L (ref 135–145)

## 2012-01-11 LAB — GLUCOSE, CAPILLARY
Glucose-Capillary: 233 mg/dL — ABNORMAL HIGH (ref 70–99)
Glucose-Capillary: 133 mg/dL — ABNORMAL HIGH (ref 70–99)

## 2012-01-11 MED ORDER — INSULIN ASPART 100 UNIT/ML ~~LOC~~ SOLN
5.0000 [IU] | Freq: Two times a day (BID) | SUBCUTANEOUS | Status: DC
Start: 2012-01-12 — End: 2012-01-12
  Administered 2012-01-12: 5 [IU] via SUBCUTANEOUS

## 2012-01-11 MED ORDER — METOPROLOL TARTRATE 25 MG PO TABS
25.0000 mg | ORAL_TABLET | Freq: Two times a day (BID) | ORAL | Status: DC
  Administered 2012-01-12: 25 mg via ORAL
  Filled 2012-01-11: qty 1

## 2012-01-11 MED ORDER — ASPIRIN EC 81 MG PO TBEC
81.0000 mg | DELAYED_RELEASE_TABLET | Freq: Every day | ORAL | Status: DC
  Administered 2012-01-12: 81 mg via ORAL
  Filled 2012-01-11: qty 1

## 2012-01-11 MED ORDER — INSULIN ASPART 100 UNIT/ML ~~LOC~~ SOLN
5.0000 [IU] | Freq: Every day | SUBCUTANEOUS | Status: DC
  Filled 2012-01-11: qty 1

## 2012-01-11 MED ORDER — PANTOPRAZOLE SODIUM 40 MG PO TBEC
40.0000 mg | DELAYED_RELEASE_TABLET | Freq: Every day | ORAL | Status: DC
  Administered 2012-01-12: 40 mg via ORAL
  Filled 2012-01-11: qty 1

## 2012-01-11 MED ORDER — LATANOPROST 0.005 % OP SOLN
1.0000 [drp] | Freq: Every day | OPHTHALMIC | Status: DC
  Filled 2012-01-11: qty 2.5

## 2012-01-11 MED ORDER — ISOSORBIDE MONONITRATE ER 30 MG PO TB24
30.0000 mg | ORAL_TABLET | Freq: Every day | ORAL | Status: DC
  Administered 2012-01-12: 30 mg via ORAL
  Filled 2012-01-11 (×2): qty 1

## 2012-01-11 MED ORDER — INSULIN GLARGINE 100 UNIT/ML ~~LOC~~ SOLN: 10.0000 [IU] | Freq: Every day | SUBCUTANEOUS | Status: DC

## 2012-01-11 MED ORDER — CLONIDINE HCL 0.2 MG/24HR TD PTWK
0.2000 mg | MEDICATED_PATCH | TRANSDERMAL | Status: DC
  Administered 2012-01-11: 0.2 mg via TRANSDERMAL
  Filled 2012-01-11: qty 1

## 2012-01-11 MED ORDER — SODIUM CHLORIDE 0.9 % IV BOLUS (SEPSIS)
1000.0000 mL | Freq: Once | INTRAVENOUS | Status: AC
  Administered 2012-01-11: 1000 mL via INTRAVENOUS

## 2012-01-11 NOTE — ED Notes (Signed)
CSW has notified Dede Query, LCSWA to follow up on pt's disposition in the am.  Maralyn Sago can be reached reached at [phone].  Pt's son notified of plan for pt and he is agreeable.  Son unable to secure care for pt this pm.

## 2012-01-11 NOTE — ED Notes (Signed)
Received update from [place], [place] re: pt's situation.  Pt's son is her primary caregiver and is a pt on [address].  Pt's neighbors brought pt to [facility] this am from [place] and essentially dropped her off.  Other family members refusing to come and get her and APS report has been filed on behalf of pt in [place]  APS to update CSW once they have a plan in place for pt's care.  Per Aundra Millet, pt brought to ED because she was having outbursts in pt's room.  Pt apparently needs assistance with her ADLs and her son cannot assist her at this point.  Son asking unit staff to provide care to pt.  CSW will follow and provide updates as appropriate.

## 2012-01-11 NOTE — ED Provider Notes (Signed)
History     CSN: [id number]  Arrival date & time 01/11/12  1456   First MD Initiated Contact with Patient 01/11/12 1459      No chief complaint on file.   (Consider location/radiation/quality/duration/timing/severity/associated sxs/prior treatment) HPI  Patient is an 79 year old female with history of CAD, diabetes, and prior stroke who was sent to the ER per recommendation of her son, who is currently hospitalized here in the hospital.  Per report the patient has been staying here at the hospital with her son for the past 36 hrs (who is admitted and is also her caregiver). The son has noticed that over the last 24 hours she has been having increase in urination (with foul smell) and an increase in confusion. The staff who takes care of her son is spending more time taking care of her than her son.  Pt has been removing her clothes on the floor.  Therefore, they are concern that she may have developed UTI and sent pt to ER for further evaluation.  History was limited as pt is only oriented to self.  Pt otherwise denies any pain, specifically no headache, chest pain, sob, abd pain, or burning on urination.     Past Medical History  Diagnosis Date  . Coronary artery disease   . Diabetes mellitus   . Pacemaker   . Asthma   . Stroke, acute, thrombotic 08/19/2011    Past Surgical History  Procedure Date  . Cardiac surgery   . Coronary angioplasty with stent placement   . Coronary artery bypass graft   . Abdominal hysterectomy   . Colon polpys     No family history on file.  History  Substance Use Topics  . Smoking status: Never Smoker   . Smokeless tobacco: Not on file  . Alcohol Use: No    OB History    Grav Para Term Preterm Abortions TAB SAB Ect Mult Living                  Review of Systems  Unable to perform ROS: Mental status change    Allergies  Lisinopril  Home Medications   Current Outpatient Rx  Name  Route  Sig  Dispense  Refill  . ASPIRIN EC 81 MG PO  TBEC   Oral   Take 81 mg by mouth every morning.         Marland Kitchen CLONIDINE HCL 0.2 MG/24HR TD PTWK   Transdermal   Place 1 patch (0.2 mg total) onto the skin once a week.   4 patch   1   . INSULIN ASPART 100 UNIT/ML [place] SOLN   Subcutaneous   Inject 5 Units into the skin daily before breakfast.   1 vial   1   . INSULIN ASPART 100 UNIT/ML [place] SOLN   Subcutaneous   Inject 5 Units into the skin 2 (two) times daily before lunch and supper.   1 vial   1   . INSULIN GLARGINE 100 UNIT/ML [place] SOLN   Subcutaneous   Inject 10 Units into the skin at bedtime.   10 mL   1   . ISOSORBIDE MONONITRATE ER 30 MG PO TB24   Oral   Take 30 mg by mouth daily.         Marland Kitchen LATANOPROST 0.005 % OP SOLN   Both Eyes   Place 1 drop into both eyes at bedtime.   2.5 mL   1   . METOPROLOL TARTRATE 25 MG PO  TABS   Oral   Take 1 tablet (25 mg total) by mouth 2 (two) times daily.   60 tablet   1   . PANTOPRAZOLE SODIUM 40 MG PO TBEC   Oral   Take 1 tablet (40 mg total) by mouth daily with breakfast.   30 tablet   1     There were no vitals taken for this visit.  Physical Exam  Nursing note and vitals reviewed. Constitutional: She appears well-developed and well-nourished. No distress.  HENT:  Head: Normocephalic and atraumatic.  Mouth/Throat: Oropharynx is clear and moist.       Poor dentition  Eyes: Conjunctivae normal and EOM are normal. Pupils are equal, round, and reactive to light.  Neck: Normal range of motion. Neck supple.  Cardiovascular:  Murmur (3/6 systolic murmur best heard at 1-6XW intercostal space on L side, radiates to axillary) heard. Pulmonary/Chest: Effort normal and breath sounds normal. No respiratory distress.  Abdominal: Soft. There is no tenderness.  Musculoskeletal: Normal range of motion. She exhibits no edema and no tenderness.  Neurological: She is alert. GCS eye subscore is 4. GCS verbal subscore is 5. GCS motor subscore is 6.       No facial droop.  Alert only  to self.  Normal grip strength.  5/5 strength to all 4 extremities.  Follows all simple commands     ED Course  Procedures (including critical care time)  Labs Reviewed - No data to display No results found.   No diagnosis found.   Date: 01/11/2012  Rate: 64  Rhythm: accelerated junctional escape rhythm  QRS Axis: left  Intervals: QT prolonged  ST/T Wave abnormalities: nonspecific ST/T changes  Conduction Disutrbances:nonspecific intraventricular conduction delay  Narrative Interpretation: ventricular paced rhythm  Old EKG Reviewed: unchanged  Results for orders placed during the hospital encounter of 01/11/12  CBC WITH DIFFERENTIAL      Component Value Range   WBC 7.2  4.0 - 10.5 K/uL   RBC 3.78 (*) 3.87 - 5.11 MIL/uL   Hemoglobin 10.0 (*) 12.0 - 15.0 g/dL   HCT 96.0 (*) 45.4 - 09.8 %   MCV 82.0  78.0 - 100.0 fL   MCH 26.5  26.0 - 34.0 pg   MCHC 32.3  30.0 - 36.0 g/dL   RDW 11.9 (*) 14.7 - 82.9 %   Platelets 189  150 - 400 K/uL   Neutrophils Relative 74  43 - 77 %   Neutro Abs 5.3  1.7 - 7.7 K/uL   Lymphocytes Relative 19  12 - 46 %   Lymphs Abs 1.4  0.7 - 4.0 K/uL   Monocytes Relative 5  3 - 12 %   Monocytes Absolute 0.4  0.1 - 1.0 K/uL   Eosinophils Relative 2  0 - 5 %   Eosinophils Absolute 0.2  0.0 - 0.7 K/uL   Basophils Relative 0  0 - 1 %   Basophils Absolute 0.0  0.0 - 0.1 K/uL  BASIC METABOLIC PANEL      Component Value Range   Sodium 138  135 - 145 mEq/L   Potassium 4.1  3.5 - 5.1 mEq/L   Chloride 100  96 - 112 mEq/L   CO2 27  19 - 32 mEq/L   Glucose, Bld 238 (*) 70 - 99 mg/dL   BUN 12  6 - 23 mg/dL   Creatinine, Ser 5.62  0.50 - 1.10 mg/dL   Calcium 9.6  8.4 - 13.0 mg/dL   GFR calc  non Af Amer 63 (*) >90 mL/min   GFR calc Af Amer 72 (*) >90 mL/min  URINALYSIS, ROUTINE W REFLEX MICROSCOPIC      Component Value Range   Color, Urine YELLOW  YELLOW   APPearance CLEAR  CLEAR   Specific Gravity, Urine 1.020  1.005 - 1.030   pH 6.0  5.0 - 8.0    Glucose, UA 500 (*) NEGATIVE mg/dL   Hgb urine dipstick SMALL (*) NEGATIVE   Bilirubin Urine NEGATIVE  NEGATIVE   Ketones, ur NEGATIVE  NEGATIVE mg/dL   Protein, ur 30 (*) NEGATIVE mg/dL   Urobilinogen, UA 1.0  0.0 - 1.0 mg/dL   Nitrite NEGATIVE  NEGATIVE   Leukocytes, UA NEGATIVE  NEGATIVE  GLUCOSE, CAPILLARY      Component Value Range   Glucose-Capillary 233 (*) 70 - 99 mg/dL  URINE MICROSCOPIC-ADD ON      Component Value Range   Squamous Epithelial / LPF FEW (*) RARE   WBC, UA 0-2  <3 WBC/hpf   RBC / HPF 3-6  <3 RBC/hpf   Urine-Other LESS THAN 10 mL OF URINE SUBMITTED    GLUCOSE, CAPILLARY      Component Value Range   Glucose-Capillary 133 (*) 70 - 99 mg/dL   Dg Chest 2 View  01/09/1094  *RADIOLOGY REPORT*  Clinical Data: Altered mental status.  CHEST - 2 VIEW  Comparison: 10/07/2011 and 08/25/2011  Findings: Chronic cardiomegaly.  Single lead pacer in place. Pulmonary vascularity is normal.  No effusions.  No infiltrates. Chronic accentuation of the thoracic kyphosis.  No acute osseous abnormality.  IMPRESSION: No acute disease.  Chronic cardiomegaly.   Original Report Authenticated By: Francene Boyers, M.D.    Ct Head Wo Contrast  01/11/2012  *RADIOLOGY REPORT*  Clinical Data: Altered mental status.  CT HEAD WITHOUT CONTRAST  Technique:  Contiguous axial images were obtained from the base of the skull through the vertex without contrast.  Comparison: 10/07/2011.  Findings: No mass lesion, mass effect, midline shift, hydrocephalus, hemorrhage.  No acute territorial cortical ischemia/infarct. Atrophy and chronic ischemic white matter disease is present.  Chronic appearing right basal ganglia lacunar infarct, chronic right basal ganglia lacunar infarcts are better seen on today's exam that the most recent prior.  The intracranial atherosclerosis.  Paranasal sinuses appear within normal limits.  IMPRESSION: No acute intracranial abnormality.  Chronic appearing right basal ganglia lacunar  infarcts.  Atrophy and chronic ischemic white matter disease.   Original Report Authenticated By: Andreas Newport, M.D.     1. Altered mental status  MDM  Per report the patient has been staying here at the hospital with her son (who is admitted and is also her caregiver). The son has noticed that over the last 24 hours she has been having increase in urination (with foul smell) and an increase in confusion. Pt is alert to person only. No distress noted.  Pt's son can be reached in his room 3017 (phone # [phone]) His name is Irmgard Rampersaud.  3:54 PM Pt is in NAD, able to follow all simple commands.  No focal neuro deficits.  Care discussed with my attending. I believe pt is at her baseline mental status.  She is in no acute distress.    8:58 PM Pt has no evidence of UTI.  Her labs are at baseline. She is in NAD.  Her head CT shows no acute changes.  Her son call to informed that she needs to have a new clonidine patch for her  BP.  Her BP is 180systolic.  Nurse has checked her skin and notice no current patch.  Will order patch.   Social worker has evaluated pt and was unable to find any placement for her.  Her primary care giver is hospitalized at this time.  She does not meet any specific criteria for admission.   12:08 AM Pt is currently place in our holding room while awaiting for placement by social worker tomorrow.  Care discussed with Dr. Hyacinth Meeker.  Med rec filled.    BP 157/62  Pulse 60  Temp 98.3 F (36.8 C) (Oral)  Resp 15  SpO2 99%  I have reviewed nursing notes and vital signs. I personally reviewed the imaging tests through PACS system  I reviewed available ER/hospitalization records thought the EMR   Fayrene Helper, PA-C 01/12/12 0009

## 2012-01-11 NOTE — ED Notes (Addendum)
Per report the patient has been staying here at the hospital with her son (who is admitted and is also her caregiver).  The son has noticed that over the last 24 hours she has been having increase in urination (with foul smell) and an increase in confusion.  Pt is alert to person only.  No distress noted.  Pt's son can be reached in his room 3017 (phone # [phone]) His name is Cindy Abbott.

## 2012-01-11 NOTE — ED Notes (Cosign Needed)
Received TC from Mrs. Bennett of [place] [facility] who states that she has not made any contact with pt's family and that her office closes at 5:00 pm.  Per Aundra Millet, LCSW on 3W, pt's son has been on phone attempting to find care for pt today.  CSW will f/u with pt's son later this pm once pt's w/u complete/disposition determined.

## 2012-01-11 NOTE — ED Notes (Signed)
Patient transported to CT 

## 2012-01-11 NOTE — Progress Notes (Signed)
Clinical Social Worker staffed case with ED Child psychotherapist.  CSW phoned Jemmie Ledgerwood at [facility] and left message inquiring about update on report made earlier in day.  CSW left ED social worker number as call back.     Angelia Mould, MSW, [place] [phone]

## 2012-01-11 NOTE — ED Notes (Signed)
Pharmacy notified about missing medications.

## 2012-01-12 ENCOUNTER — Encounter (HOSPITAL_COMMUNITY): Payer: Self-pay | Admitting: Emergency Medicine

## 2012-01-12 LAB — GLUCOSE, CAPILLARY: Glucose-Capillary: 123 mg/dL — ABNORMAL HIGH (ref 70–99)

## 2012-01-12 NOTE — ED Provider Notes (Signed)
This was a shared encounter.  This elderly female presents after she was disruptive in an other Hospital area.  Notably, the patient was disruptive while her son, our patient's primary caregiver, was being evaluated for another issue.  On exam the patient is in no distress, though she is disoriented.  Vital signs are stable, and her labs are not notable.  With the absence of a caregiver, the patient's disorientation, seemingly more consistent with dementia and delirium, we discussed her care with social work.  The patient was placed in observation pending placement.  Gerhard Munch, MD 01/12/12 [phone]

## 2012-01-12 NOTE — ED Notes (Signed)
Pt food tray delivered to patient

## 2012-01-12 NOTE — ED Provider Notes (Signed)
Pt's son has been discharged from the hospital and is comfortable taking the patient home where she will be well supervised.   Charles B. Bernette Mayers, MD 01/12/12 1109

## 2012-01-12 NOTE — ED Notes (Signed)
Spoke with pt son. He is currently being discharged from the hospital and is planning to come to get pt and take her home with him. States he has help at home.

## 2012-01-12 NOTE — ED Notes (Signed)
After reviewing pt chart noted pt has hx of dysphagia. Spoke with dr Bernette Mayers and speech eval ordered before giving pt po.

## 2012-01-12 NOTE — Evaluation (Signed)
Clinical/Bedside Swallow Evaluation Patient Details  Name: Cindy Abbott MRN: [id number] DOB: 04/22/1922  Today's Date: 01/12/2012 Time:  -     Past Medical History:  Past Medical History  Diagnosis Date  . Coronary artery disease   . Diabetes mellitus   . Pacemaker   . Asthma   . Stroke, acute, thrombotic 08/19/2011    Unknown when in the past but she has received TPA for a CVA   Past Surgical History:  Past Surgical History  Procedure Date  . Cardiac surgery   . Coronary angioplasty with stent placement   . Coronary artery bypass graft   . Abdominal hysterectomy   . 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  Per report the patient has been staying here at the hospital with her son for the past 36 hrs (who is admitted and is also her caregiver). The son has noticed that over the last 24 hours she has been having increase in urination (with foul smell) and an increase in confusion. The staff who takes care of her son is spending more time taking care of her than her son.  Pt has been removing her clothes on the floor.  Therefore, they are concern that she may have developed UTI and sent pt to ER for further evaluation.  History was limited as pt is only oriented to self.  Pt otherwise denies any pain, specifically no headache, chest pain, sob, abd pain, or burning on urination.  Pt with a history of primary oral and esophageal dysphagia. MBS 08/23/11 rec Dys 1/thin with chin tuck and multiple swallows. Progressed to at least Dys 2 texture during CIR stay.    Assessment/Recommendations/Treatment Plan    SLP Assessment Clinical Impression Statement: Pt presents with swallow function likely at baseline, consistent with discharge report from SLP last year on CIR. Pt  with intermittent wet vocal quality with speech, solids and liquids which she clear with an occasional second swallow or throat clear. Pt with dementia, unlikely to have been following any special swallow strategies. Pt has been tolerating POs without any pulmonary complications, can functionally consume soft solids and liquids with minimal risk. Recommend Dys 2/thin liquid diet with basic aspiration precautions (appropriate posture, etc), communicated to RN. No SLP f/u needed at this time.  Risk for Aspiration: Mild Other Related Risk Factors: History of dysphagia;Previous CVA;Cognitive impairment  Swallow Evaluation Recommendations Diet Recommendations: Dysphagia 2 (Fine chop);Thin liquid Liquid Administration via: Cup;No straw Medication Administration: Whole meds with liquid Supervision: Intermittent supervision to cue for compensatory strategies;Patient able to self feed Compensations: Slow rate;Small sips/bites Postural Changes and/or Swallow Maneuvers: Seated upright 90 degrees Follow up Recommendations: None  Treatment Plan Treatment Plan Recommendations: No treatment recommended at this time    General  HPI: Patient is an 79 year old female with history of CAD, diabetes, and prior stroke (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  Per report the patient has been staying here at the hospital with her son for the past 36 hrs (who is admitted and is also her caregiver). The son has noticed that over the last 24 hours she has been having increase in urination (  with foul smell) and an increase in confusion. The staff who takes care of her son is spending more time taking care of her than her son.  Pt has been removing her clothes on the floor.  Therefore, they are concern that she may have developed UTI and sent pt to ER for further evaluation.  History was limited as  pt is only oriented to self.  Pt otherwise denies any pain, specifically no headache, chest pain, sob, abd pain, or burning on urination.  Pt with a history of primary oral and esophageal dysphagia. MBS 08/23/11 rec Dys 1/thin with chin tuck and multiple swallows. Progressed to at least Dys 2 texture during CIR stay.  Type of Study: Bedside swallow evaluation Previous Swallow Assessment: See HPI Diet Prior to this Study: NPO Temperature Spikes Noted: No Respiratory Status: Room air History of Recent Intubation: No Behavior/Cognition: Alert;Cooperative;Pleasant mood;Confused Oral Cavity - Dentition: Edentulous Self-Feeding Abilities: Able to feed self Patient Positioning: Upright in bed Baseline Vocal Quality: Wet Volitional Cough: Strong Volitional Swallow: Able to elicit  Oral Motor/Sensory Function  Overall Oral Motor/Sensory Function: Impaired at baseline Labial ROM: Reduced left Labial Symmetry: Within Functional Limits Lingual ROM: Reduced left Lingual Symmetry: Abnormal symmetry left Lingual Strength: Reduced Facial ROM: Within Functional Limits Facial Symmetry: Within Functional Limits Facial Strength: Within Functional Limits  Consistency Results  Ice Chips Ice chips: Not tested  Thin Liquid Thin Liquid: Impaired Presentation: Cup;Self Fed Pharyngeal  Phase Impairments: Multiple swallows;Wet Vocal Quality;Throat Clearing - Immediate  Nectar Thick Liquid Nectar Thick Liquid: Not tested  Honey Thick Liquid Honey Thick Liquid: Not tested  Puree Puree: Impaired Presentation: Spoon;Self Fed Pharyngeal Phase Impairments: Throat Clearing - Delayed;Wet Vocal Quality  Solid Solid: Impaired Presentation: Self Fed Oral Phase Impairments: Impaired anterior to posterior transit Pharyngeal Phase Impairments: Wet Vocal Quality;Throat Clearing - Delayed Harlon Ditty, MA CCC-SLP [phone]   [place] Toor, Riley Nearing 01/12/2012,10:33 AM

## 2012-01-17 LAB — GLUCOSE, CAPILLARY: Glucose-Capillary: 211 mg/dL — ABNORMAL HIGH (ref 70–99)

## 2012-04-06 ENCOUNTER — Other Ambulatory Visit: Payer: Self-pay | Admitting: Neurology

## 2012-04-10 ENCOUNTER — Other Ambulatory Visit: Payer: Self-pay | Admitting: Neurology

## 2012-05-10 ENCOUNTER — Encounter (HOSPITAL_COMMUNITY): Payer: Self-pay

## 2012-05-10 ENCOUNTER — Emergency Department (HOSPITAL_COMMUNITY): Payer: Medicare Other

## 2012-05-10 ENCOUNTER — Inpatient Hospital Stay (HOSPITAL_COMMUNITY)
Admission: EM | Admit: 2012-05-10 | Discharge: 2012-05-15 | DRG: 389 | Disposition: A | Discharge status: Home Health | Payer: Medicare Other | Attending: Internal Medicine | Admitting: Internal Medicine

## 2012-05-10 DIAGNOSIS — K56600 Partial intestinal obstruction, unspecified as to cause: Secondary | ICD-10-CM

## 2012-05-10 DIAGNOSIS — Z9861 Coronary angioplasty status: Secondary | ICD-10-CM

## 2012-05-10 DIAGNOSIS — Z950 Presence of cardiac pacemaker: Secondary | ICD-10-CM

## 2012-05-10 DIAGNOSIS — R410 Disorientation, unspecified: Secondary | ICD-10-CM

## 2012-05-10 DIAGNOSIS — N289 Disorder of kidney and ureter, unspecified: Secondary | ICD-10-CM

## 2012-05-10 DIAGNOSIS — I639 Cerebral infarction, unspecified: Secondary | ICD-10-CM

## 2012-05-10 DIAGNOSIS — R5381 Other malaise: Secondary | ICD-10-CM | POA: Diagnosis present

## 2012-05-10 DIAGNOSIS — R531 Weakness: Secondary | ICD-10-CM

## 2012-05-10 DIAGNOSIS — R109 Unspecified abdominal pain: Secondary | ICD-10-CM

## 2012-05-10 DIAGNOSIS — K6389 Other specified diseases of intestine: Secondary | ICD-10-CM

## 2012-05-10 DIAGNOSIS — Z8673 Personal history of transient ischemic attack (TIA), and cerebral infarction without residual deficits: Secondary | ICD-10-CM

## 2012-05-10 DIAGNOSIS — R5383 Other fatigue: Secondary | ICD-10-CM | POA: Diagnosis present

## 2012-05-10 DIAGNOSIS — K5900 Constipation, unspecified: Secondary | ICD-10-CM

## 2012-05-10 DIAGNOSIS — Z79899 Other long term (current) drug therapy: Secondary | ICD-10-CM

## 2012-05-10 DIAGNOSIS — I2510 Atherosclerotic heart disease of native coronary artery without angina pectoris: Secondary | ICD-10-CM

## 2012-05-10 DIAGNOSIS — Z951 Presence of aortocoronary bypass graft: Secondary | ICD-10-CM

## 2012-05-10 DIAGNOSIS — E119 Type 2 diabetes mellitus without complications: Secondary | ICD-10-CM

## 2012-05-10 DIAGNOSIS — E876 Hypokalemia: Secondary | ICD-10-CM

## 2012-05-10 DIAGNOSIS — N390 Urinary tract infection, site not specified: Secondary | ICD-10-CM

## 2012-05-10 DIAGNOSIS — K56609 Unspecified intestinal obstruction, unspecified as to partial versus complete obstruction: Principal | ICD-10-CM

## 2012-05-10 DIAGNOSIS — I509 Heart failure, unspecified: Secondary | ICD-10-CM

## 2012-05-10 DIAGNOSIS — I4891 Unspecified atrial fibrillation: Secondary | ICD-10-CM | POA: Diagnosis present

## 2012-05-10 DIAGNOSIS — Z794 Long term (current) use of insulin: Secondary | ICD-10-CM

## 2012-05-10 DIAGNOSIS — Z87898 Personal history of other specified conditions: Secondary | ICD-10-CM

## 2012-05-10 DIAGNOSIS — D649 Anemia, unspecified: Secondary | ICD-10-CM

## 2012-05-10 DIAGNOSIS — J45909 Unspecified asthma, uncomplicated: Secondary | ICD-10-CM

## 2012-05-10 DIAGNOSIS — I5032 Chronic diastolic (congestive) heart failure: Secondary | ICD-10-CM | POA: Diagnosis present

## 2012-05-10 DIAGNOSIS — Z9889 Other specified postprocedural states: Secondary | ICD-10-CM

## 2012-05-10 HISTORY — DX: Unspecified atrial fibrillation: I48.91

## 2012-05-10 HISTORY — DX: Anemia, unspecified: D64.9

## 2012-05-10 HISTORY — DX: Polyp of colon: K63.5

## 2012-05-10 LAB — HEPATIC FUNCTION PANEL
Albumin: 3.1 g/dL — ABNORMAL LOW (ref 3.5–5.2)
Indirect Bilirubin: 0.8 mg/dL (ref 0.3–0.9)
Total Protein: 6.7 g/dL (ref 6.0–8.3)

## 2012-05-10 LAB — CBC WITH DIFFERENTIAL/PLATELET
Basophils Absolute: 0 10*3/uL (ref 0.0–0.1)
Basophils Relative: 0 % (ref 0–1)
HCT: 32.6 % — ABNORMAL LOW (ref 36.0–46.0)
Lymphocytes Relative: 10 % — ABNORMAL LOW (ref 12–46)
MCHC: 31.6 g/dL (ref 30.0–36.0)
Monocytes Absolute: 0.5 10*3/uL (ref 0.1–1.0)
Neutro Abs: 6.5 10*3/uL (ref 1.7–7.7)
Platelets: 244 10*3/uL (ref 150–400)
RDW: 17.6 % — ABNORMAL HIGH (ref 11.5–15.5)
WBC: 7.9 10*3/uL (ref 4.0–10.5)

## 2012-05-10 LAB — GLUCOSE, CAPILLARY: Glucose-Capillary: 144 mg/dL — ABNORMAL HIGH (ref 70–99)

## 2012-05-10 LAB — URINE MICROSCOPIC-ADD ON

## 2012-05-10 LAB — URINALYSIS, ROUTINE W REFLEX MICROSCOPIC
Ketones, ur: NEGATIVE mg/dL
Nitrite: NEGATIVE
Protein, ur: 100 mg/dL — AB
pH: 7 (ref 5.0–8.0)

## 2012-05-10 LAB — BASIC METABOLIC PANEL
CO2: 29 mEq/L (ref 19–32)
Calcium: 9.1 mg/dL (ref 8.4–10.5)
Chloride: 102 mEq/L (ref 96–112)
Creatinine, Ser: 0.99 mg/dL (ref 0.50–1.10)
GFR calc Af Amer: 58 mL/min — ABNORMAL LOW (ref >90)
Sodium: 141 mEq/L (ref 135–145)

## 2012-05-10 LAB — LIPASE, BLOOD: Lipase: 45 U/L (ref 11–59)

## 2012-05-10 MED ORDER — DEXTROSE 5 % IV SOLN
1.0000 g | Freq: Once | INTRAVENOUS | Status: AC
  Administered 2012-05-10: 1 g via INTRAVENOUS
  Filled 2012-05-10: qty 10

## 2012-05-10 MED ORDER — HYDROCODONE-ACETAMINOPHEN 5-325 MG PO TABS
1.0000 | ORAL_TABLET | ORAL | Status: DC | PRN
  Administered 2012-05-13 – 2012-05-14 (×2): 1 via ORAL
  Filled 2012-05-10 (×2): qty 1

## 2012-05-10 MED ORDER — CLONIDINE HCL 0.2 MG/24HR TD PTWK
0.2000 mg | MEDICATED_PATCH | TRANSDERMAL | Status: DC
  Administered 2012-05-14: 0.2 mg via TRANSDERMAL
  Filled 2012-05-10: qty 1

## 2012-05-10 MED ORDER — ACETAMINOPHEN 325 MG PO TABS: 650.0000 mg | ORAL_TABLET | Freq: Four times a day (QID) | ORAL | Status: DC | PRN

## 2012-05-10 MED ORDER — ONDANSETRON HCL 4 MG/2ML IJ SOLN
4.0000 mg | Freq: Once | INTRAMUSCULAR | Status: AC
  Administered 2012-05-10: 4 mg via INTRAVENOUS
  Filled 2012-05-10: qty 2

## 2012-05-10 MED ORDER — METOPROLOL TARTRATE 1 MG/ML IV SOLN
2.5000 mg | Freq: Four times a day (QID) | INTRAVENOUS | Status: DC
  Administered 2012-05-10 – 2012-05-11 (×4): 2.5 mg via INTRAVENOUS
  Filled 2012-05-10 (×4): qty 5

## 2012-05-10 MED ORDER — SODIUM CHLORIDE 0.9 % IV BOLUS (SEPSIS)
250.0000 mL | Freq: Once | INTRAVENOUS | Status: AC
  Administered 2012-05-10: 250 mL via INTRAVENOUS

## 2012-05-10 MED ORDER — ENOXAPARIN SODIUM 40 MG/0.4ML ~~LOC~~ SOLN: 40.0000 mg | SUBCUTANEOUS | Status: DC

## 2012-05-10 MED ORDER — ACETAMINOPHEN 650 MG RE SUPP: 650.0000 mg | Freq: Four times a day (QID) | RECTAL | Status: DC | PRN

## 2012-05-10 MED ORDER — SODIUM CHLORIDE 0.9 % IV SOLN: INTRAVENOUS | Status: DC

## 2012-05-10 MED ORDER — LATANOPROST 0.005 % OP SOLN
1.0000 [drp] | Freq: Every day | OPHTHALMIC | Status: DC
  Administered 2012-05-12 – 2012-05-14 (×4): 1 [drp] via OPHTHALMIC
  Filled 2012-05-10: qty 2.5

## 2012-05-10 MED ORDER — HYDROMORPHONE HCL PF 1 MG/ML IJ SOLN
0.5000 mg | INTRAMUSCULAR | Status: DC | PRN
  Administered 2012-05-10 – 2012-05-12 (×3): 0.5 mg via INTRAVENOUS
  Filled 2012-05-10 (×3): qty 1

## 2012-05-10 MED ORDER — INSULIN ASPART 100 UNIT/ML ~~LOC~~ SOLN
0.0000 [IU] | Freq: Three times a day (TID) | SUBCUTANEOUS | Status: DC
  Administered 2012-05-10: 2 [IU] via SUBCUTANEOUS
  Administered 2012-05-13: 3 [IU] via SUBCUTANEOUS
  Administered 2012-05-14: 2 [IU] via SUBCUTANEOUS
  Administered 2012-05-14 – 2012-05-15 (×3): 3 [IU] via SUBCUTANEOUS

## 2012-05-10 MED ORDER — METOPROLOL TARTRATE 50 MG PO TABS: 50.0000 mg | ORAL_TABLET | Freq: Two times a day (BID) | ORAL | Status: DC

## 2012-05-10 MED ORDER — SODIUM CHLORIDE 0.9 % IV SOLN
INTRAVENOUS | Status: DC
  Administered 2012-05-10 – 2012-05-13 (×6): via INTRAVENOUS

## 2012-05-10 MED ORDER — IOHEXOL 300 MG/ML  SOLN
100.0000 mL | Freq: Once | INTRAMUSCULAR | Status: AC | PRN
  Administered 2012-05-10: 100 mL via INTRAVENOUS

## 2012-05-10 NOTE — ED Notes (Signed)
Pt's son reports pt c/o abd pain since yesterday.  Reports LBM was 7 days ago.  Also reports has noticed a strong odor to her urine and some increased confusion since last night.  Reports has been straining to have a bm and has a hemorrhoid.  Son reports pt is presently on iron for anemia.

## 2012-05-10 NOTE — ED Notes (Signed)
Pt placed on bed pan per son request.

## 2012-05-10 NOTE — ED Provider Notes (Addendum)
History    This chart was scribed for Shelda Jakes, MD by Leone Payor, ED Scribe. This patient was seen in room APA02/APA02 and the patient's care was started 11:09 AM.   CSN: [id number]  Arrival date & time 05/10/12  1041   First MD Initiated Contact with Patient 05/10/12 1103      Chief Complaint  Patient presents with  . Constipation  . Abdominal Pain  . Altered Mental Status     The history is provided by a relative. The history is limited by the condition of the patient. No language interpreter was used.    Level 5 Caveat-Altered Mental Status HPI Comments: Cindy Abbott is a 79 y.o. female who presents to the Emergency Department complaining of constant, unchanged abdominal pain starting yesterday. Pt's son reports her last BM was 7 days ago. She has been straining to have one and has a hemorrhoid. Pt is currently taking iron for anemia. Some reports noticing a strong odor to her urine and she has had increased confusion in the last 2-3 days. Pt had a stroke about 1 year ago that she has left sided weakness from. Pt has h/o CAD, DM. Pt denies smoking and alcohol use. Partial ROS was given by pt's son.   Cardiologist in [place]. PCP in [place].  Past Medical History  Diagnosis Date  . Coronary artery disease   . Diabetes mellitus   . Pacemaker   . Asthma   . Stroke, acute, thrombotic 08/19/2011    Unknown when in the past but she has received TPA for a CVA  . Anemia     Past Surgical History  Procedure Laterality Date  . Cardiac surgery    . Coronary angioplasty with stent placement    . Coronary artery bypass graft    . Abdominal hysterectomy    . Colon polpys      No family history on file.  History  Substance Use Topics  . Smoking status: Never Smoker   . Smokeless tobacco: Not on file  . Alcohol Use: No    OB History   Grav Para Term Preterm Abortions TAB SAB Ect Mult Living                  Review of Systems  Unable to perform ROS: Mental  status change  Constitutional: Negative for fever.  Gastrointestinal: Positive for constipation. Negative for nausea and vomiting.  Psychiatric/Behavioral: Positive for confusion.    Allergies  Lisinopril  Home Medications   Current Outpatient Rx  Name  Route  Sig  Dispense  Refill  . aspirin 325 MG tablet   Oral   Take 325 mg by mouth daily.         . cloNIDine (CATAPRES - DOSED IN MG/24 HR) 0.2 mg/24hr patch   Transdermal   Place 1 patch (0.2 mg total) onto the skin once a week.   4 patch   1   . docusate sodium (COLACE) 100 MG capsule   Oral   Take 100 mg by mouth 2 (two) times daily.         . insulin glargine (LANTUS) 100 UNIT/ML injection   Subcutaneous   Inject 15-18 Units into the skin at bedtime.         . insulin lispro (HUMALOG) 100 UNIT/ML injection   Subcutaneous   Inject 2-6 Units into the skin 3 (three) times daily before meals. Per sliding scale         . isosorbide  mononitrate (IMDUR) 30 MG 24 hr tablet   Oral   Take 30 mg by mouth daily.         Marland Kitchen latanoprost (XALATAN) 0.005 % ophthalmic solution   Both Eyes   Place 1 drop into both eyes at bedtime.   2.5 mL   1   . metoprolol (LOPRESSOR) 50 MG tablet   Oral   Take 50 mg by mouth 2 (two) times daily.           BP 167/78  Pulse 59  Temp(Src) 98 F (36.7 C) (Oral)  Resp 20  Ht 5\' 2"  (1.575 m)  Wt 125 lb (56.7 kg)  BMI 22.86 kg/m2  SpO2 100%  Physical Exam  Nursing note and vitals reviewed. Constitutional: She is oriented to person, place, and time. She appears well-developed and well-nourished. No distress.  HENT:  Head: Normocephalic and atraumatic.  Mouth/Throat: Mucous membranes are dry.  Eyes: EOM are normal.  Neck: Neck supple. No tracheal deviation present.  Cardiovascular: Normal rate, regular rhythm and normal heart sounds.   Pulmonary/Chest: Effort normal and breath sounds normal. No respiratory distress. She has no wheezes. She has no rales. She exhibits no  tenderness.  Abdominal: Soft. Bowel sounds are normal. She exhibits distension. There is no tenderness.  Musculoskeletal: Normal range of motion. She exhibits no edema.  Neurological: She is alert and oriented to person, place, and time.  Skin: Skin is warm and dry.  Psychiatric: She has a normal mood and affect. Her behavior is normal.    ED Course  Procedures (including critical care time)  DIAGNOSTIC STUDIES: Oxygen Saturation is 96% on room air, adequate by my interpretation.    COORDINATION OF CARE: 11:32 AM Discussed treatment plan with pt at bedside and pt agreed to plan.   Results for orders placed during the hospital encounter of 05/10/12  CBC WITH DIFFERENTIAL      Result Value Range   WBC 7.9  4.0 - 10.5 K/uL   RBC 3.84 (*) 3.87 - 5.11 MIL/uL   Hemoglobin 10.3 (*) 12.0 - 15.0 g/dL   HCT 16.1 (*) 09.6 - 04.5 %   MCV 84.9  78.0 - 100.0 fL   MCH 26.8  26.0 - 34.0 pg   MCHC 31.6  30.0 - 36.0 g/dL   RDW 40.9 (*) 81.1 - 91.4 %   Platelets 244  150 - 400 K/uL   Neutrophils Relative 82 (*) 43 - 77 %   Neutro Abs 6.5  1.7 - 7.7 K/uL   Lymphocytes Relative 10 (*) 12 - 46 %   Lymphs Abs 0.8  0.7 - 4.0 K/uL   Monocytes Relative 7  3 - 12 %   Monocytes Absolute 0.5  0.1 - 1.0 K/uL   Eosinophils Relative 1  0 - 5 %   Eosinophils Absolute 0.1  0.0 - 0.7 K/uL   Basophils Relative 0  0 - 1 %   Basophils Absolute 0.0  0.0 - 0.1 K/uL  BASIC METABOLIC PANEL      Result Value Range   Sodium 141  135 - 145 mEq/L   Potassium 3.4 (*) 3.5 - 5.1 mEq/L   Chloride 102  96 - 112 mEq/L   CO2 29  19 - 32 mEq/L   Glucose, Bld 214 (*) 70 - 99 mg/dL   BUN 9  6 - 23 mg/dL   Creatinine, Ser 7.82  0.50 - 1.10 mg/dL   Calcium 9.1  8.4 - 95.6 mg/dL  GFR calc non Af Amer 50 (*) >90 mL/min   GFR calc Af Amer 58 (*) >90 mL/min  URINALYSIS, ROUTINE W REFLEX MICROSCOPIC      Result Value Range   Color, Urine YELLOW  YELLOW   APPearance HAZY (*) CLEAR   Specific Gravity, Urine 1.020  1.005 -  1.030   pH 7.0  5.0 - 8.0   Glucose, UA NEGATIVE  NEGATIVE mg/dL   Hgb urine dipstick SMALL (*) NEGATIVE   Bilirubin Urine NEGATIVE  NEGATIVE   Ketones, ur NEGATIVE  NEGATIVE mg/dL   Protein, ur 413 (*) NEGATIVE mg/dL   Urobilinogen, UA 1.0  0.0 - 1.0 mg/dL   Nitrite NEGATIVE  NEGATIVE   Leukocytes, UA LARGE (*) NEGATIVE  URINE MICROSCOPIC-ADD ON      Result Value Range   WBC, UA TOO NUMEROUS TO COUNT  <3 WBC/hpf   RBC / HPF TOO NUMEROUS TO COUNT  <3 RBC/hpf   Bacteria, UA MANY (*) RARE     Ct Abdomen Pelvis W Contrast  05/10/2012  *RADIOLOGY REPORT*  Clinical Data: Constipation, abdominal pain, altered mental status  CT ABDOMEN AND PELVIS WITH CONTRAST  Technique:  Multidetector CT imaging of the abdomen and pelvis was performed following the standard protocol during bolus administration of intravenous contrast.  Contrast: OMNIPAQUE IOHEXOL 300 MG/ML  SOLN  Comparison: None.  Findings:  Enteric contrast extends to the level of a markedly distended cecum.  The cecum, ascending and transverse colon appear dilated to the level of the splenic flexure where there is an apparent apple core type lesion, measuring approximately 3.2 x 2.9 cm (Coronal image 34; axial image 28, series 2).  This finding is associated with relative decompression of the downstream colon. There is a minimal amount of stranding about the cecum and ascending colon. No pneumoperitoneum, pneumatosis or portal venous gas.  No discrete/definable fluid collection.  Normal appearance of the appendix.  Normal contour.  There is an approximately 1.3 x 1.6 cm hypoattenuating lesion with the dome of the left lobe of the liver (image 14, series 2) which cannot be characterized as a hepatic cyst.  There is mild heterogeneity of the hepatic parenchyma which may be secondary to an element of venous congestion secondary to reflux of contrast into the hepatic venous system.  Normal gallbladder.  No intra or extrahepatic biliary duct  dilatation. There is a small amount of fluid adjacent to the tip of the right lobe of the liver.  There is symmetric enhancement and excretion of the bilateral kidneys.  Scattered bilateral sub centimeter hypoattenuating lesions are too small to accurate characterize are favored to represent a renal cyst.  No definite renal stones on the post contrast examination.  Note is made of extrarenal pelvises bilaterally.  No urinary obstruction or perinephric stranding. There is mild thickening of the crux of the left adrenal gland without discrete nodule.  Normal appearance of the right adrenal gland and spleen.  There is mild prominence of the pancreatic duct measuring approximately 6 mm in diameter at the level of the pancreatic head (images 25 and 27) however this is not associated with a discrete pancreatic mass or pancreatic atrophy.  No peripancreatic stranding.  Rather extensive atherosclerotic plaque within a normal caliber abdominal aorta.  There is extensive atherosclerotic plaque involving the origin and proximal aspects of the main branch vessels of the abdominal aorta.  The IMA appears patent on this non CT examination.  Incidental note is made of a retroaortic left  sided renal vein.  No retroperitoneal, mesenteric, pelvic or inguinal lymphadenopathy.  Limited visualization of the lower thorax is negative for focal airspace opacity or pleural effusion.  Cardiomegaly, particular, there is enlargement of the right side of the heart.  Extensive calcifications within the mitral valve annulus.  A ventricular pacer lead terminates within the right ventricular apex. No pericardial effusion.  No acute or aggressive osseous abnormalities.  IMPRESSION:  1.  Findings worrisome for a near obstructive "apple core" type lesion within the splenic flexure of the colon with associated marked dilatation of the cecum, ascending and transverse colon. There is a minimal amount of stranding about the cecum and ascending colon  without evidence of perforation or definable/drainable fluid collection.  2.  Approximately 1.6 cm hypoattenuating lesion within the dome of the left lobe of the liver.  In the setting of a possible colonic mass, this lesion is indeterminate and further evaluation with MRI in the non-emergent setting may be obtained as clinically indicated.  3.  Cardiomegaly with findings suggestive of possible mild right- sided heart failure.  Further evaluation with cardiac echo may be obtained if clinically indicated.  4.  Extensive atherosclerotic plaque within the normal caliber abdominal aorta.  5.  Nonspecific prominence of the pancreatic duct without associated pancreatic atrophy. No discrete pancreatic mass or peripancreatic stranding.  Further evaluation with a non emergent MRCP may be obtained as clinically indicated.   Original Report Authenticated By: Tacey Ruiz, MD    Dg Abd Acute W/chest  05/10/2012  *RADIOLOGY REPORT*  Clinical Data: Abdominal pain, altered mental status, constipation  ACUTE ABDOMEN SERIES (ABDOMEN 2 VIEW & CHEST 1 VIEW)  Comparison: Chest radiograph - 01/11/2012  Findings:  Grossly unchanged cardiac silhouette and mediastinal contours post median sternotomy and CABG.  There are extensive calcifications within the mitral valve annulus.  Stable positioning of support apparatus.  Mild cephalization of flow without frank evidence of edema.  No focal airspace opacity.  No definite pleural effusion or pneumothorax.  Moderate colonic stool burden (in particular within the rectal vault) without evidence of obstruction.  Vascular calcifications overlie the left upper abdomen.  No definite pneumoperitoneum, pneumatosis or portal venous gas.  Multilevel thoracolumbar spine degenerative change.  IMPRESSION: 1.  No acute cardiopulmonary disease. 2.  Moderate colonic stool burden without evidence of obstruction.   Original Report Authenticated By: Tacey Ruiz, MD      1. Urinary tract infection   2.  Partial bowel obstruction       MDM  Urinalysis definitely consistent with urinary tract infection. CT of the abdomen is very concerning for a near obstruction of a core type lesion in the splenic flexure of the colon. Also questionable mass in the liver. Patient as per her son, has had a history of a very large polyp in the colon in the past but they have decided not to remove it and biopsy and was deemed to be nonmalignant. This workup was done in a [facility] last time she was there was 2 years ago. We have no CT scans for comparison here. Patient is not having any vomiting. However has not had a bowel movement in a week. Discussed with hospitalist they will admit also will consult with general surgery to follow the patient in the hospital. Temporary admitting orders completed. Patient's mental status is close to baseline the patient is a non-walker. Patient primary caretaker is her son.     I personally performed the services described in this documentation, which  was scribed in my presence. The recorded information has been reviewed and is accurate.    Shelda Jakes, MD 05/10/12 1510  Addendum: Contacted Dr. Malvin Johns that he was only on call for general surgery until 3:00 he called back at around 3:30 PM. I have placed a call to Dr. Lovell Sheehan who is supposedly on call after 3:00 in the afternoon. Call is still pending.  Shelda Jakes, MD 05/10/12 [id number]

## 2012-05-10 NOTE — ED Notes (Signed)
Pt taken off bedpan. No bm or urination.

## 2012-05-10 NOTE — H&P (Signed)
[facility] History and Physical  Cindy Abbott GEX:[id number] DOB: 12-10-1922 DOA: 05/10/2012  Referring physician:  PCP: No primary provider on file.  Specialists:   Chief Complaint:   HPI: Cindy Abbott is a 79 y.o. female past medical hx of CAD with pacemaker, DM, stroke, chf  Colonic polyps presents with cc abdominal pain. infor from son who provides care. States pt has not had bm for 7 days. Pt is on iron for anemia due to "bleeding polyp".  He does report pt passing gas. He states 2 days ago she started complaining of abdominal pain. No nausea, vomiting or anorexia. Pain was intermittent, sharp and mostly located in lower quadrants. He also reports that she has been sleeping more than usual the last 2 days and that her urine developed "strong odor" yesterday. Symptoms came on suddenly have persisted and worsened. Characterized as moderate. Work up in ED yields UTI, abdominal CT findings worrisome for near obstructive lesion. [facility] asked to admit   Review of Systems: The patient denies anorexia, fever, weight loss,, vision loss, decreased hearing, hoarseness, chest pain, syncope, dyspnea on exertion, peripheral edema,  hemoptysis, melena, hematochezia, severe indigestion/heartburn, hematuria, incontinence, genital sores, muscle weakness, suspicious skin lesions, transient blindness,  depression, unusual weight change, abnormal bleeding, enlarged lymph nodes, angioedema, and breast masses.    Past Medical History  Diagnosis Date  . Coronary artery disease   . Diabetes mellitus   . Pacemaker   . Asthma   . Stroke, acute, thrombotic 08/19/2011    Unknown when in the past but she has received TPA for a CVA  . Anemia    Past Surgical History  Procedure Laterality Date  . Cardiac surgery    . Coronary angioplasty with stent placement    . Coronary artery bypass graft    . Abdominal hysterectomy    . Colon polpys     Social History:  reports that she has never smoked. She does not have any  smokeless tobacco history on file. She reports that she does not drink alcohol or use illicit drugs. Lives with son uses walker for ambulation. Moderate assist with ADL's Allergies  Allergen Reactions  . Lisinopril Anaphylaxis and Swelling    No family history on file. unable to obtain at this time  Prior to Admission medications   Medication Sig Start Date End Date Taking? Authorizing Provider  aspirin 325 MG tablet Take 325 mg by mouth daily.   Yes Historical Provider, MD  cloNIDine (CATAPRES - DOSED IN MG/24 HR) 0.2 mg/24hr patch Place 1 patch (0.2 mg total) onto the skin once a week. 08/23/11 08/22/12 Yes Micki Riley, MD  docusate sodium (COLACE) 100 MG capsule Take 100 mg by mouth 2 (two) times daily.   Yes Historical Provider, MD  insulin glargine (LANTUS) 100 UNIT/ML injection Inject 15-18 Units into the skin at bedtime. 09/14/11 09/13/12 Yes Daniel J Angiulli, PA-C  insulin lispro (HUMALOG) 100 UNIT/ML injection Inject 2-6 Units into the skin 3 (three) times daily before meals. Per sliding scale   Yes Historical Provider, MD  isosorbide mononitrate (IMDUR) 30 MG 24 hr tablet Take 30 mg by mouth daily.   Yes Historical Provider, MD  latanoprost (XALATAN) 0.005 % ophthalmic solution Place 1 drop into both eyes at bedtime. 09/14/11 09/13/12 Yes Daniel J Angiulli, PA-C  metoprolol (LOPRESSOR) 50 MG tablet Take 50 mg by mouth 2 (two) times daily.   Yes Historical Provider, MD   Physical Exam: Filed Vitals:   05/10/12 1054 05/10/12  1227 05/10/12 1538  BP: 167/64 167/78 163/63  Pulse: 61 59 59  Temp: 98 F (36.7 C)    TempSrc: Oral    Resp: 16 20 20   Height: 5\' 2"  (1.575 m)    Weight: 56.7 kg (125 lb)    SpO2: 96% 100% 100%     General:  Frail somewhat ill appearing  Eyes: PERRL EOMI   ENT: ears clear no nasal drainage. Mucus membrane slightly pale and dry  Neck: supple no JVD no lymphadenopathy  Cardiovascular: RRR No MGR No LE edema  Respiratory: normal effort somewhat  shallow no wheeze BS clear bilaterally  Abdomen: slightly rounded but soft. Sluggish BS mild diffuse tenderness to palpation  Skin: warm dry no rash no lesion  Musculoskeletal: no clubbing no cyanosis  Psychiatric: calm cooperative  Neurologic: speech soft but clear. Cranial nerve Ii-XII intact follow commands  Labs on Admission:  Basic Metabolic Panel:  Recent Labs Lab 05/10/12 1052  NA 141  K 3.4*  CL 102  CO2 29  GLUCOSE 214*  BUN 9  CREATININE 0.99  CALCIUM 9.1   Liver Function Tests:  Recent Labs Lab 05/10/12 1422  AST 14  ALT <5  ALKPHOS 55  BILITOT 1.0  PROT 6.7  ALBUMIN 3.1*    Recent Labs Lab 05/10/12 1422  LIPASE 45   No results found for this basename: AMMONIA,  in the last 168 hours CBC:  Recent Labs Lab 05/10/12 1052  WBC 7.9  NEUTROABS 6.5  HGB 10.3*  HCT 32.6*  MCV 84.9  PLT 244   Cardiac Enzymes: No results found for this basename: CKTOTAL, CKMB, CKMBINDEX, TROPONINI,  in the last 168 hours  BNP (last 3 results)  Recent Labs  08/18/11 1658  PROBNP 7537.0*   CBG: No results found for this basename: GLUCAP,  in the last 168 hours  Radiological Exams on Admission: Ct Abdomen Pelvis W Contrast  05/10/2012  *RADIOLOGY REPORT*  Clinical Data: Constipation, abdominal pain, altered mental status  CT ABDOMEN AND PELVIS WITH CONTRAST  Technique:  Multidetector CT imaging of the abdomen and pelvis was performed following the standard protocol during bolus administration of intravenous contrast.  Contrast: OMNIPAQUE IOHEXOL 300 MG/ML  SOLN  Comparison: None.  Findings:  Enteric contrast extends to the level of a markedly distended cecum.  The cecum, ascending and transverse colon appear dilated to the level of the splenic flexure where there is an apparent apple core type lesion, measuring approximately 3.2 x 2.9 cm (Coronal image 34; axial image 28, series 2).  This finding is associated with relative decompression of the downstream  colon. There is a minimal amount of stranding about the cecum and ascending colon. No pneumoperitoneum, pneumatosis or portal venous gas.  No discrete/definable fluid collection.  Normal appearance of the appendix.  Normal contour.  There is an approximately 1.3 x 1.6 cm hypoattenuating lesion with the dome of the left lobe of the liver (image 14, series 2) which cannot be characterized as a hepatic cyst.  There is mild heterogeneity of the hepatic parenchyma which may be secondary to an element of venous congestion secondary to reflux of contrast into the hepatic venous system.  Normal gallbladder.  No intra or extrahepatic biliary duct dilatation. There is a small amount of fluid adjacent to the tip of the right lobe of the liver.  There is symmetric enhancement and excretion of the bilateral kidneys.  Scattered bilateral sub centimeter hypoattenuating lesions are too small to accurate characterize are  favored to represent a renal cyst.  No definite renal stones on the post contrast examination.  Note is made of extrarenal pelvises bilaterally.  No urinary obstruction or perinephric stranding. There is mild thickening of the crux of the left adrenal gland without discrete nodule.  Normal appearance of the right adrenal gland and spleen.  There is mild prominence of the pancreatic duct measuring approximately 6 mm in diameter at the level of the pancreatic head (images 25 and 27) however this is not associated with a discrete pancreatic mass or pancreatic atrophy.  No peripancreatic stranding.  Rather extensive atherosclerotic plaque within a normal caliber abdominal aorta.  There is extensive atherosclerotic plaque involving the origin and proximal aspects of the main branch vessels of the abdominal aorta.  The IMA appears patent on this non CT examination.  Incidental note is made of a retroaortic left sided renal vein.  No retroperitoneal, mesenteric, pelvic or inguinal lymphadenopathy.  Limited visualization of  the lower thorax is negative for focal airspace opacity or pleural effusion.  Cardiomegaly, particular, there is enlargement of the right side of the heart.  Extensive calcifications within the mitral valve annulus.  A ventricular pacer lead terminates within the right ventricular apex. No pericardial effusion.  No acute or aggressive osseous abnormalities.  IMPRESSION:  1.  Findings worrisome for a near obstructive "apple core" type lesion within the splenic flexure of the colon with associated marked dilatation of the cecum, ascending and transverse colon. There is a minimal amount of stranding about the cecum and ascending colon without evidence of perforation or definable/drainable fluid collection.  2.  Approximately 1.6 cm hypoattenuating lesion within the dome of the left lobe of the liver.  In the setting of a possible colonic mass, this lesion is indeterminate and further evaluation with MRI in the non-emergent setting may be obtained as clinically indicated.  3.  Cardiomegaly with findings suggestive of possible mild right- sided heart failure.  Further evaluation with cardiac echo may be obtained if clinically indicated.  4.  Extensive atherosclerotic plaque within the normal caliber abdominal aorta.  5.  Nonspecific prominence of the pancreatic duct without associated pancreatic atrophy. No discrete pancreatic mass or peripancreatic stranding.  Further evaluation with a non emergent MRCP may be obtained as clinically indicated.   Original Report Authenticated By: Tacey Ruiz, MD    Dg Abd Acute W/chest  05/10/2012  *RADIOLOGY REPORT*  Clinical Data: Abdominal pain, altered mental status, constipation  ACUTE ABDOMEN SERIES (ABDOMEN 2 VIEW & CHEST 1 VIEW)  Comparison: Chest radiograph - 01/11/2012  Findings:  Grossly unchanged cardiac silhouette and mediastinal contours post median sternotomy and CABG.  There are extensive calcifications within the mitral valve annulus.  Stable positioning of support  apparatus.  Mild cephalization of flow without frank evidence of edema.  No focal airspace opacity.  No definite pleural effusion or pneumothorax.  Moderate colonic stool burden (in particular within the rectal vault) without evidence of obstruction.  Vascular calcifications overlie the left upper abdomen.  No definite pneumoperitoneum, pneumatosis or portal venous gas.  Multilevel thoracolumbar spine degenerative change.  IMPRESSION: 1.  No acute cardiopulmonary disease. 2.  Moderate colonic stool burden without evidence of obstruction.   Original Report Authenticated By: Tacey Ruiz, MD     EKG: Independently reviewed.   Assessment/Plan Principal Problem:   Abdominal pain: likely related to obstructive lesion per CT abdomen. Concern for malignancy as well.  Lipase within normal limits. LFT unremarkable.  Admit to med  surg. Keep NPO. Gastroenterology consult. Provide pain med.  Active Problems: UTI (urinary tract infection): will request culture. Continue rocephin. Gently hydrate.     Coronary artery disease: no chest pain. On Imdur will hold for now      Anemia: likely of chronic disease. Current level appears at baseline    Hypokalemia: mild will recheck in am    Diabetes mellitus: will check a1c. Will hold home regimen given NPO status and use SSI for glycemic control    CHF, chronic: diastolic. Compensated. Echo 8/13 yields EF 65-70% with mild LVH.     S/P placement of cardiac pacemaker    Constipation: see #1. Will develop bowel regimen once #1 addresssed  General surgery Code Status: full Family Communication: son at bedside Disposition Plan: to be determined  Time spent: 65 minutes  [facility]   If 7PM-7AM, please contact night-coverage [email or web address] Password [facility] 05/10/2012, 4:06 PM   Attending note:  Patient seen and examined. She presents with a near obstructive apple core lesion on CT of the abdomen. There is reports that she had a prior  evaluation at [facility] approximately 2 years ago where she was found to have a colonic polyp. She will need further GI evaluation here. Gastroenterology has been consulted. Pending their evaluation, may need to have surgical consultation. Will keep patient n.p.o. right now is that she has a possible bowel obstruction. She was also found to have a possible UTI. She is to start on IV antibiotics. Family has refused Lovenox dosing today due to concerns of GI bleeding in the past. We'll need to discuss with him to the risk benefits of DVT prophylaxis. Use SCDs for now.

## 2012-05-11 ENCOUNTER — Encounter (HOSPITAL_COMMUNITY): Payer: Self-pay | Admitting: Gastroenterology

## 2012-05-11 DIAGNOSIS — R109 Unspecified abdominal pain: Secondary | ICD-10-CM

## 2012-05-11 DIAGNOSIS — N390 Urinary tract infection, site not specified: Secondary | ICD-10-CM

## 2012-05-11 DIAGNOSIS — K5669 Other intestinal obstruction: Secondary | ICD-10-CM

## 2012-05-11 DIAGNOSIS — K639 Disease of intestine, unspecified: Secondary | ICD-10-CM

## 2012-05-11 DIAGNOSIS — I509 Heart failure, unspecified: Secondary | ICD-10-CM

## 2012-05-11 DIAGNOSIS — K6389 Other specified diseases of intestine: Secondary | ICD-10-CM

## 2012-05-11 LAB — COMPREHENSIVE METABOLIC PANEL
AST: 15 U/L (ref 0–37)
Albumin: 2.8 g/dL — ABNORMAL LOW (ref 3.5–5.2)
Alkaline Phosphatase: 58 U/L (ref 39–117)
CO2: 23 mEq/L (ref 19–32)
Chloride: 102 mEq/L (ref 96–112)
Creatinine, Ser: 1.08 mg/dL (ref 0.50–1.10)
GFR calc non Af Amer: 45 mL/min — ABNORMAL LOW (ref >90)
Potassium: 3.9 mEq/L (ref 3.5–5.1)
Total Bilirubin: 1.1 mg/dL (ref 0.3–1.2)

## 2012-05-11 LAB — CBC
MCH: 27.4 pg (ref 26.0–34.0)
MCHC: 32.1 g/dL (ref 30.0–36.0)
MCV: 85.3 fL (ref 78.0–100.0)
Platelets: 265 10*3/uL (ref 150–400)
RDW: 17.4 % — ABNORMAL HIGH (ref 11.5–15.5)

## 2012-05-11 LAB — GLUCOSE, CAPILLARY: Glucose-Capillary: 114 mg/dL — ABNORMAL HIGH (ref 70–99)

## 2012-05-11 LAB — HEMOGLOBIN A1C
Hgb A1c MFr Bld: 6.4 % — ABNORMAL HIGH (ref <5.7)
Mean Plasma Glucose: 137 mg/dL — ABNORMAL HIGH (ref <117)

## 2012-05-11 MED ORDER — METOPROLOL TARTRATE 1 MG/ML IV SOLN
2.5000 mg | Freq: Four times a day (QID) | INTRAVENOUS | Status: DC
  Administered 2012-05-12 – 2012-05-14 (×10): 2.5 mg via INTRAVENOUS
  Filled 2012-05-11 (×11): qty 5

## 2012-05-11 MED ORDER — DEXTROSE 5 % IV SOLN
1.0000 g | INTRAVENOUS | Status: DC
  Administered 2012-05-11 – 2012-05-13 (×3): 1 g via INTRAVENOUS
  Filled 2012-05-11 (×4): qty 10

## 2012-05-11 NOTE — Consult Note (Signed)
Referring Provider: Erick Blinks, MD Primary Care Physician:  No primary provider on file.  Primary Gastroenterologist:  [facility]  Reason for Consultation:  abd pain, anemia  HPI: Cindy Abbott is a 79 y.o. 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 Patient able to provide some history but limited due to dementia. Spoke with her son, [name], whom she lives with via phone.   Patient has h/o multiple colon polyps in 2008, but large one could not be removed endoscopically and patient chose not to have surgical resection. She was monitored yearly via colonoscopy until around 2011. Followed by GI at [facility] previously. At one point, she had her coumadin held secondary to chronic GI blood loss related to the anemia. She presents now with complaints of abdominal pain. According to her son, she started having problems having a BM after her iron was increased to TID by her cardiologist, Dr Jerrol Banana in [place]. She is having some watery stools. She has been consuming mostly liquids. No report of vomiting or rectal bleeding. She has had malodorous urine recently. U/A + for UTI.  CT showed near obstructing apple core type lesion at splenic flexure with marked upstream colonic dilation. She has 1.6cm hypoattenuating lesion within dome of the left lobe of the liver which is indeterminate.        Prior to Admission medications   Medication Sig Start Date End Date Taking? Authorizing Provider  aspirin 325 MG tablet Take 325 mg by mouth daily.   Yes Historical Provider, MD  cloNIDine (CATAPRES - DOSED IN MG/24 HR) 0.2 mg/24hr patch Place 1 patch (0.2 mg total) onto the skin once a week. 08/23/11 08/22/12 Yes Micki Riley, MD  docusate sodium (COLACE) 100 MG capsule Take 100 mg by mouth 2 (two) times daily.   Yes Historical Provider, MD  insulin glargine (LANTUS) 100 UNIT/ML injection Inject 15-18 Units into the skin at bedtime.  09/14/11 09/13/12 Yes Daniel J Angiulli, PA-C  insulin lispro (HUMALOG) 100 UNIT/ML injection Inject 2-6 Units into the skin 3 (three) times daily before meals. Per sliding scale   Yes Historical Provider, MD  isosorbide mononitrate (IMDUR) 30 MG 24 hr tablet Take 30 mg by mouth daily.   Yes Historical Provider, MD  latanoprost (XALATAN) 0.005 % ophthalmic solution Place 1 drop into both eyes at bedtime. 09/14/11 09/13/12 Yes Daniel J Angiulli, PA-C  metoprolol (LOPRESSOR) 50 MG tablet Take 50 mg by mouth 2 (two) times daily.   Yes Historical Provider, MD  Iron TID.  Current Facility-Administered Medications  Medication Dose Route Frequency Provider Last Rate Last Dose  . 0.9 %  sodium chloride infusion   Intravenous Continuous Shelda Jakes, MD 75 mL/hr at 05/10/12 2346    . acetaminophen (TYLENOL) tablet 650 mg  650 mg Oral Q6H PRN Gwenyth Bender, NP       Or  . acetaminophen (TYLENOL) suppository 650 mg  650 mg Rectal Q6H PRN Gwenyth Bender, NP      . cloNIDine (CATAPRES - Dosed in mg/24 hr) patch 0.2 mg  0.2 mg Transdermal Weekly Lesle Chris Black, NP      . enoxaparin (LOVENOX) injection 40 mg  40 mg Subcutaneous Q24H Lesle Chris Black, NP      . HYDROcodone-acetaminophen (NORCO/VICODIN) 5-325 MG per tablet 1-2 tablet  1-2 tablet Oral Q4H PRN Lesle Chris Black, NP      . HYDROmorphone (DILAUDID) injection 0.5 mg  0.5 mg Intravenous Q2H PRN Gwenyth Bender, NP   0.5 mg at 05/10/12 2345  . insulin aspart (novoLOG) injection 0-15 Units  0-15 Units Subcutaneous TID WC Gwenyth Bender, NP   2 Units at 05/10/12 1745  . latanoprost (XALATAN) 0.005 % ophthalmic solution 1 drop  1 drop Both Eyes QHS Lesle Chris Black, NP      . metoprolol (LOPRESSOR) injection 2.5 mg  2.5 mg Intravenous Q6H Lesle Chris Black, NP   2.5 mg at 05/11/12 0549    Allergies as of 05/10/2012 - Review Complete 05/10/2012  Allergen Reaction Noted  . Lisinopril Anaphylaxis and Swelling 08/18/2011    Past Medical History  Diagnosis Date  .  Coronary artery disease   . Diabetes mellitus   . Pacemaker   . Asthma   . Stroke, acute, thrombotic 08/19/2011    Unknown when in the past but she has received TPA for a CVA  . Anemia   . Colon polyps     Followed by GI at [facility]. h/o numerous polyps 2008 with large one that could not be removed endoscopically. patient opted against surgical resection. yearly colonoscopies until 2011. previously on coumadin but was held due to slow GI bleed related to large polyp    Past Surgical History  Procedure Laterality Date  . Cardiac surgery    . Coronary angioplasty with stent placement    . Coronary artery bypass graft    . Abdominal hysterectomy      Family History  Problem Relation Age of Onset  . Colon cancer Neg Hx     History   Social History  . Marital Status: Widowed    Spouse Name: N/A    Number of Children: N/A  . Years of Education: N/A   Occupational History  . Not on file.   Social History Main Topics  . Smoking status: Never Smoker   . Smokeless tobacco: Not on file  . Alcohol Use: No  . Drug Use: No  . Sexually Active: Not on file     Comment: hysterectomy   Other Topics Concern  . Not on file   Social History Narrative  . No narrative on file     ROS: difficult to obtain due to dementia  General: Negative for anorexia, weight loss, fever, chills, fatigue, weakness. See hpi Eyes: Negative for vision changes.  ENT: Negative for hoarseness, difficulty swallowing , nasal congestion. CV: Negative for chest pain, angina, palpitations, dyspnea on exertion, peripheral edema.  Respiratory: Negative for dyspnea at rest, dyspnea on exertion, cough, sputum, wheezing.  GI: See history of present illness. GU:  Negative for dysuria, hematuria, urinary incontinence, urinary frequency, nocturnal urination.  MS: Negative for joint pain, low back pain.  Derm: Negative for rash or itching.  Neuro: Negative for weakness, abnormal sensation, seizure, frequent headaches,  memory loss, confusion.  Psych: Negative for anxiety, depression, suicidal ideation, hallucinations.  Endo: Negative for unusual weight change.  Heme: Negative for bruising or bleeding. Allergy: Negative for rash or hives.       Physical Examination: Vital signs in last 24 hours: Temp:  [98 F (36.7 C)-99.7 F (37.6 C)] 99.7 F (37.6 C) (05/08 0403) Pulse Rate:  [59-62] 62 (05/08 0403) Resp:  [16-20] 16 (05/08 0403) BP: (149-183)/(44-78) 150/44 mmHg (05/08 0403) SpO2:  [96 %-100 %] 96 % (05/08 0403) Weight:  [124 lb 1.9 oz (56.3 kg)-131 lb 4.8 oz (59.557 kg)] 131 lb 4.8 oz (59.557 kg) (05/08 0403) Last BM Date:  05/11/12  General: Well-nourished, well-developed in no acute distress. Pleasantly confused. Head: Normocephalic, atraumatic.   Eyes: Conjunctiva pink, no icterus. Mouth: Oropharyngeal mucosa moist and pink , no lesions erythema or exudate. Neck: Supple without thyromegaly, masses, or lymphadenopathy.  Lungs: Clear to auscultation bilaterally.  Heart: Regular rate and rhythm, no murmurs rubs or gallops.  Abdomen: Bowel sounds are normal, mild right sided abd tenderness, nondistended, no hepatosplenomegaly or masses, no abdominal bruits or    hernia , no rebound or guarding.   Rectal: not performed Extremities: No lower extremity edema, clubbing, deformity.  Neuro: Alert and oriented x 4 , grossly normal neurologically.  Skin: Warm and dry, no rash or jaundice.   Psych: Alert and cooperative, normal mood and affect.        Intake/Output from previous day: 05/07 0701 - 05/08 0700 In: 467.5 [I.V.:417.5; IV Piggyback:50] Out: 51 [Urine:50; Stool:1] Intake/Output this shift: Total I/O In: -  Out: 4 [Urine:2; Stool:2]  Lab Results: CBC  Recent Labs  05/10/12 1052 05/11/12 0507  WBC 7.9 6.0  HGB 10.3* 9.7*  HCT 32.6* 30.2*  MCV 84.9 85.3  PLT 244 265   BMET  Recent Labs  05/10/12 1052 05/11/12 0507  NA 141 138  K 3.4* 3.9  CL 102 102  CO2 29 23   GLUCOSE 214* 140*  BUN 9 16  CREATININE 0.99 1.08  CALCIUM 9.1 8.8   LFT  Recent Labs  05/10/12 1422 05/11/12 0507  BILITOT 1.0 1.1  BILIDIR 0.2  --   IBILI 0.8  --   ALKPHOS 55 58  AST 14 15  ALT <5 <5  PROT 6.7 6.0  ALBUMIN 3.1* 2.8*     Lab Results  Component Value Date   LIPASE 45 05/10/2012    PT/INR No results found for this basename: LABPROT, INR,  in the last 72 hours    Imaging Studies: Ct Abdomen Pelvis W Contrast  05/10/2012  *RADIOLOGY REPORT*  Clinical Data: Constipation, abdominal pain, altered mental status  CT ABDOMEN AND PELVIS WITH CONTRAST  Technique:  Multidetector CT imaging of the abdomen and pelvis was performed following the standard protocol during bolus administration of intravenous contrast.  Contrast: OMNIPAQUE IOHEXOL 300 MG/ML  SOLN  Comparison: None.  Findings:  Enteric contrast extends to the level of a markedly distended cecum.  The cecum, ascending and transverse colon appear dilated to the level of the splenic flexure where there is an apparent apple core type lesion, measuring approximately 3.2 x 2.9 cm (Coronal image 34; axial image 28, series 2).  This finding is associated with relative decompression of the downstream colon. There is a minimal amount of stranding about the cecum and ascending colon. No pneumoperitoneum, pneumatosis or portal venous gas.  No discrete/definable fluid collection.  Normal appearance of the appendix.  Normal contour.  There is an approximately 1.3 x 1.6 cm hypoattenuating lesion with the dome of the left lobe of the liver (image 14, series 2) which cannot be characterized as a hepatic cyst.  There is mild heterogeneity of the hepatic parenchyma which may be secondary to an element of venous congestion secondary to reflux of contrast into the hepatic venous system.  Normal gallbladder.  No intra or extrahepatic biliary duct dilatation. There is a small amount of fluid adjacent to the tip of the right lobe of the  liver.  There is symmetric enhancement and excretion of the bilateral kidneys.  Scattered bilateral sub centimeter hypoattenuating lesions are too small to  accurate characterize are favored to represent a renal cyst.  No definite renal stones on the post contrast examination.  Note is made of extrarenal pelvises bilaterally.  No urinary obstruction or perinephric stranding. There is mild thickening of the crux of the left adrenal gland without discrete nodule.  Normal appearance of the right adrenal gland and spleen.  There is mild prominence of the pancreatic duct measuring approximately 6 mm in diameter at the level of the pancreatic head (images 25 and 27) however this is not associated with a discrete pancreatic mass or pancreatic atrophy.  No peripancreatic stranding.  Rather extensive atherosclerotic plaque within a normal caliber abdominal aorta.  There is extensive atherosclerotic plaque involving the origin and proximal aspects of the main branch vessels of the abdominal aorta.  The IMA appears patent on this non CT examination.  Incidental note is made of a retroaortic left sided renal vein.  No retroperitoneal, mesenteric, pelvic or inguinal lymphadenopathy.  Limited visualization of the lower thorax is negative for focal airspace opacity or pleural effusion.  Cardiomegaly, particular, there is enlargement of the right side of the heart.  Extensive calcifications within the mitral valve annulus.  A ventricular pacer lead terminates within the right ventricular apex. No pericardial effusion.  No acute or aggressive osseous abnormalities.  IMPRESSION:  1.  Findings worrisome for a near obstructive "apple core" type lesion within the splenic flexure of the colon with associated marked dilatation of the cecum, ascending and transverse colon. There is a minimal amount of stranding about the cecum and ascending colon without evidence of perforation or definable/drainable fluid collection.  2.  Approximately 1.6  cm hypoattenuating lesion within the dome of the left lobe of the liver.  In the setting of a possible colonic mass, this lesion is indeterminate and further evaluation with MRI in the non-emergent setting may be obtained as clinically indicated.  3.  Cardiomegaly with findings suggestive of possible mild right- sided heart failure.  Further evaluation with cardiac echo may be obtained if clinically indicated.  4.  Extensive atherosclerotic plaque within the normal caliber abdominal aorta.  5.  Nonspecific prominence of the pancreatic duct without associated pancreatic atrophy. No discrete pancreatic mass or peripancreatic stranding.  Further evaluation with a non emergent MRCP may be obtained as clinically indicated.   Original Report Authenticated By: Tacey Ruiz, MD    Dg Abd Acute W/chest  05/10/2012  *RADIOLOGY REPORT*  Clinical Data: Abdominal pain, altered mental status, constipation  ACUTE ABDOMEN SERIES (ABDOMEN 2 VIEW & CHEST 1 VIEW)  Comparison: Chest radiograph - 01/11/2012  Findings:  Grossly unchanged cardiac silhouette and mediastinal contours post median sternotomy and CABG.  There are extensive calcifications within the mitral valve annulus.  Stable positioning of support apparatus.  Mild cephalization of flow without frank evidence of edema.  No focal airspace opacity.  No definite pleural effusion or pneumothorax.  Moderate colonic stool burden (in particular within the rectal vault) without evidence of obstruction.  Vascular calcifications overlie the left upper abdomen.  No definite pneumoperitoneum, pneumatosis or portal venous gas.  Multilevel thoracolumbar spine degenerative change.  IMPRESSION: 1.  No acute cardiopulmonary disease. 2.  Moderate colonic stool burden without evidence of obstruction.   Original Report Authenticated By: Tacey Ruiz, MD   Pierre.Alas week]   Impression: 79 year old female with history of large colon polyp not removed years ago and had been followed for several  years. According to the son, patient was offered surgical resection of this  large polyp but given her other medical problems, primarily heart history, she opted against surgery. Several biopsies on followup continued to be negative at that time. Last seen 3 years ago at [facility]. She now presents with near obstructing lesion of the splenic flexure. Liver lesion, indeterminate, cannot exclude metastasis. Cardiomegaly noted on CT. Nonspecific prominence of the pancreatic duct also noted. Likely insignificant finding.  I discussed case with Dr. Jena Gauss and with patient's son, [name]. At this point it appears that she has need of surgical treatment for near obstruction regardless of whether we are dealing with benign or malignant lesion in the colon. Mostly concerning for malignant process however. Son would like to avoid surgery if possible. Agree with need for surgical input at this point. Ultimately she may be manageable at [facility] with colonic stent but it is not clear whether she is a good candidate for this based on location of her lesion.  Plan: 1. Agree with surgical consultation for input regarding near obstruction colon mass. 2. I note that the family did not agree to Lovenox given her history of GI bleeding related to colon lesion. She has been ordered for Lovenox (did not get a dose). I will d/c order and order SCDs. 3. Keep NPO until seen by surgery.  4. Antibiotic therapy for UTI as per attending.    LOS: 1 day   Tana Coast  05/11/2012, 10:17 AM  Attending note  Patient seen with Dr. Sunnie Nielsen;  patient with what is most likely a mechanical large bowel obstruction from a tumor. Surgical therapy would be definitive. Location of obstruction is not very amenable to stenting. I do not believe that colonoscopy would change the ultimate need for surgical therapy. Will await surgery consultation. Would also like to get the last colonoscopy report from [facility] for review.

## 2012-05-11 NOTE — Progress Notes (Signed)
INITIAL NUTRITION ASSESSMENT  DOCUMENTATION CODES Per approved criteria  -Not Applicable   INTERVENTION: RD will follow for diet advancement   NUTRITION DIAGNOSIS: Inadequate oral intake related to inability to eat as evidenced by NPO status.  Goal: Pt to meet >/= 90% of their estimated nutrition needs  Monitor:  Po intake, labs and wt trends  Reason for Assessment: Malnutrition Screen 79 y.o. female  Admitting Dx: Abdominal pain  ASSESSMENT: Pt wt decreased 25#, 15% in past eight months trending toward significance. PMH includes dementia, swallow difficulty, chronic CHF which may also be contributing to wt fluctuation.  Surgical consult pending; due to lesion within the splenic flexure of the colon.  Pt is NPO will follow for diet advancement. At risk for malnutrition given her wt loss hx and current acute illness.   Height: Ht Readings from Last 1 Encounters:  05/10/12 5\' 2"  (1.575 m)    Weight: Wt Readings from Last 1 Encounters:  05/11/12 131 lb 4.8 oz (59.557 kg)    Ideal Body Weight: 110#  % Ideal Body Weight: 119%  Wt Readings from Last 10 Encounters:  05/11/12 131 lb 4.8 oz (59.557 kg)  09/08/11 157 lb 13.6 oz (71.6 kg)  08/23/11 155 lb 6.8 oz (70.5 kg)    Usual Body Weight: 152-157#  % Usual Body Weight: 85%  BMI:  Body mass index is 24.01 kg/(m^2). Overweight   Estimated Nutritional Needs: Kcal: 1500-1700 Protein: 65-75 gr Fluid: 1500 ml/day  Skin: No issues noted  Diet Order: NPO  EDUCATION NEEDS: -Education not appropriate at this time   Intake/Output Summary (Last 24 hours) at 05/11/12 1616 Last data filed at 05/11/12 1454  Gross per 24 hour  Intake  467.5 ml  Output     15 ml  Net  452.5 ml    Last BM:  05/11/12  Labs:   Recent Labs Lab 05/10/12 1052 05/11/12 0507  NA 141 138  K 3.4* 3.9  CL 102 102  CO2 29 23  BUN 9 16  CREATININE 0.99 1.08  CALCIUM 9.1 8.8  GLUCOSE 214* 140*    CBG (last 3)   Recent Labs  05/10/12 2056 05/11/12 0043 05/11/12 0751  GLUCAP 100* 113* 120*    Scheduled Meds: . cefTRIAXone (ROCEPHIN)  IV  1 g Intravenous Q24H  . cloNIDine  0.2 mg Transdermal Weekly  . insulin aspart  0-15 Units Subcutaneous TID WC  . latanoprost  1 drop Both Eyes QHS  . metoprolol  2.5 mg Intravenous Q6H    Continuous Infusions: . sodium chloride 75 mL/hr at 05/11/12 1203    Past Medical History  Diagnosis Date  . Coronary artery disease   . Diabetes mellitus   . Pacemaker   . Asthma   . Stroke, acute, thrombotic 08/19/2011    Unknown when in the past but she has received TPA for a CVA  . Anemia   . Colon polyps     Followed by GI at [facility]. h/o numerous polyps 2008 with large one that could not be removed endoscopically. patient opted against surgical resection. yearly colonoscopies until 2011. previously on coumadin but was held due to slow GI bleed related to large polyp  . A-fib     previously on coumadin but withheld due to chronic GI bleed    Past Surgical History  Procedure Laterality Date  . Cardiac surgery    . Coronary angioplasty with stent placement    . Coronary artery bypass graft    . Abdominal  hysterectomy      Royann Shivers MS,RD,LDN,CSG Office: [phone] Pager: [phone]

## 2012-05-11 NOTE — Progress Notes (Signed)
UR chart review completed.  

## 2012-05-11 NOTE — Progress Notes (Signed)
[facility] PROGRESS NOTE  Cindy Abbott ZOX:[id number] DOB: December 10, 1922 DOA: 05/10/2012 PCP: No primary provider on file.  Assessment/Plan: 1-near obstructive "apple core" type lesion within the splenic flexure of the colon: GI recommend surgery consult. Surgery consulted.   2-UTI (urinary tract infection): UA with too numerous to count WBC. Follow urine  culture. Continue with rocephin day 2.  3-Coronary artery disease: no chest pain. On Imdur will hold for now  4-Anemia: Current level appears at baseline. Monitor for GI bleed.  5-Hypokalemia: resolved.  6-Diabetes mellitus: A-1c at 6.4. Will hold home regimen given NPO status . Continue with SSI for glycemic control  7-CHF, chronic: diastolic. Compensated. Echo 8/13 yields EF 65-70% with mild LVH. Monitor on IV fluids.  8-S/P placement of cardiac pacemaker  9-Constipation: in setting colon mass. Addressing # 1.   10-HTN: Continue with Clonidine. Metoprolol IV, holding parameters for HR.  11-DVT prophylaxis: SCD.    Code Status: Full Family Communication: none at bedside.  Disposition Plan: To be determine   Consultants: Surgery. GI  Procedures:  none  Antibiotics:  Ceftriaxone  HPI/Subjective: Abdominal pain only on palpation. She denies dyspnea, or chest pain.   Objective: Filed Vitals:   05/10/12 2057 05/11/12 0004 05/11/12 0403 05/11/12 1149  BP: 157/55 154/46 150/44 150/55  Pulse: 60  62 62  Temp: 98 F (36.7 C)  99.7 F (37.6 C)   TempSrc: Oral  Oral   Resp: 18  16 16   Height:      Weight:   59.557 kg (131 lb 4.8 oz)   SpO2: 99%  96%     Intake/Output Summary (Last 24 hours) at 05/11/12 1258 Last data filed at 05/11/12 0745  Gross per 24 hour  Intake  467.5 ml  Output      5 ml  Net  462.5 ml   Filed Weights   05/10/12 1054 05/10/12 1720 05/11/12 0403  Weight: 56.7 kg (125 lb) 56.3 kg (124 lb 1.9 oz) 59.557 kg (131 lb 4.8 oz)    Exam:   General:  Awake,alert, in no distress.    Cardiovascular: S 1, S 2 RRR  Respiratory: CTA  Abdomen: Bs present, soft, mild diffuse tenderness. ,   Musculoskeletal: no edema.   Data Reviewed: Basic Metabolic Panel:  Recent Labs Lab 05/10/12 1052 05/11/12 0507  NA 141 138  K 3.4* 3.9  CL 102 102  CO2 29 23  GLUCOSE 214* 140*  BUN 9 16  CREATININE 0.99 1.08  CALCIUM 9.1 8.8   Liver Function Tests:  Recent Labs Lab 05/10/12 1422 05/11/12 0507  AST 14 15  ALT <5 <5  ALKPHOS 55 58  BILITOT 1.0 1.1  PROT 6.7 6.0  ALBUMIN 3.1* 2.8*    Recent Labs Lab 05/10/12 1422  LIPASE 45   No results found for this basename: AMMONIA,  in the last 168 hours CBC:  Recent Labs Lab 05/10/12 1052 05/11/12 0507  WBC 7.9 6.0  NEUTROABS 6.5  --   HGB 10.3* 9.7*  HCT 32.6* 30.2*  MCV 84.9 85.3  PLT 244 265   Cardiac Enzymes: No results found for this basename: CKTOTAL, CKMB, CKMBINDEX, TROPONINI,  in the last 168 hours BNP (last 3 results)  Recent Labs  08/18/11 1658  PROBNP 7537.0*   CBG:  Recent Labs Lab 05/10/12 1653 05/10/12 2056 05/11/12 0043 05/11/12 0751  GLUCAP 144* 100* 113* 120*    No results found for this or any previous visit (from the past 240 hour(s)).  Studies: Ct Abdomen Pelvis W Contrast  05/10/2012  *RADIOLOGY REPORT*  Clinical Data: Constipation, abdominal pain, altered mental status  CT ABDOMEN AND PELVIS WITH CONTRAST  Technique:  Multidetector CT imaging of the abdomen and pelvis was performed following the standard protocol during bolus administration of intravenous contrast.  Contrast: OMNIPAQUE IOHEXOL 300 MG/ML  SOLN  Comparison: None.  Findings:  Enteric contrast extends to the level of a markedly distended cecum.  The cecum, ascending and transverse colon appear dilated to the level of the splenic flexure where there is an apparent apple core type lesion, measuring approximately 3.2 x 2.9 cm (Coronal image 34; axial image 28, series 2).  This finding is associated with  relative decompression of the downstream colon. There is a minimal amount of stranding about the cecum and ascending colon. No pneumoperitoneum, pneumatosis or portal venous gas.  No discrete/definable fluid collection.  Normal appearance of the appendix.  Normal contour.  There is an approximately 1.3 x 1.6 cm hypoattenuating lesion with the dome of the left lobe of the liver (image 14, series 2) which cannot be characterized as a hepatic cyst.  There is mild heterogeneity of the hepatic parenchyma which may be secondary to an element of venous congestion secondary to reflux of contrast into the hepatic venous system.  Normal gallbladder.  No intra or extrahepatic biliary duct dilatation. There is a small amount of fluid adjacent to the tip of the right lobe of the liver.  There is symmetric enhancement and excretion of the bilateral kidneys.  Scattered bilateral sub centimeter hypoattenuating lesions are too small to accurate characterize are favored to represent a renal cyst.  No definite renal stones on the post contrast examination.  Note is made of extrarenal pelvises bilaterally.  No urinary obstruction or perinephric stranding. There is mild thickening of the crux of the left adrenal gland without discrete nodule.  Normal appearance of the right adrenal gland and spleen.  There is mild prominence of the pancreatic duct measuring approximately 6 mm in diameter at the level of the pancreatic head (images 25 and 27) however this is not associated with a discrete pancreatic mass or pancreatic atrophy.  No peripancreatic stranding.  Rather extensive atherosclerotic plaque within a normal caliber abdominal aorta.  There is extensive atherosclerotic plaque involving the origin and proximal aspects of the main branch vessels of the abdominal aorta.  The IMA appears patent on this non CT examination.  Incidental note is made of a retroaortic left sided renal vein.  No retroperitoneal, mesenteric, pelvic or inguinal  lymphadenopathy.  Limited visualization of the lower thorax is negative for focal airspace opacity or pleural effusion.  Cardiomegaly, particular, there is enlargement of the right side of the heart.  Extensive calcifications within the mitral valve annulus.  A ventricular pacer lead terminates within the right ventricular apex. No pericardial effusion.  No acute or aggressive osseous abnormalities.  IMPRESSION:  1.  Findings worrisome for a near obstructive "apple core" type lesion within the splenic flexure of the colon with associated marked dilatation of the cecum, ascending and transverse colon. There is a minimal amount of stranding about the cecum and ascending colon without evidence of perforation or definable/drainable fluid collection.  2.  Approximately 1.6 cm hypoattenuating lesion within the dome of the left lobe of the liver.  In the setting of a possible colonic mass, this lesion is indeterminate and further evaluation with MRI in the non-emergent setting may be obtained as clinically indicated.  3.  Cardiomegaly with findings suggestive of possible mild right- sided heart failure.  Further evaluation with cardiac echo may be obtained if clinically indicated.  4.  Extensive atherosclerotic plaque within the normal caliber abdominal aorta.  5.  Nonspecific prominence of the pancreatic duct without associated pancreatic atrophy. No discrete pancreatic mass or peripancreatic stranding.  Further evaluation with a non emergent MRCP may be obtained as clinically indicated.   Original Report Authenticated By: Tacey Ruiz, MD    Dg Abd Acute W/chest  05/10/2012  *RADIOLOGY REPORT*  Clinical Data: Abdominal pain, altered mental status, constipation  ACUTE ABDOMEN SERIES (ABDOMEN 2 VIEW & CHEST 1 VIEW)  Comparison: Chest radiograph - 01/11/2012  Findings:  Grossly unchanged cardiac silhouette and mediastinal contours post median sternotomy and CABG.  There are extensive calcifications within the mitral valve  annulus.  Stable positioning of support apparatus.  Mild cephalization of flow without frank evidence of edema.  No focal airspace opacity.  No definite pleural effusion or pneumothorax.  Moderate colonic stool burden (in particular within the rectal vault) without evidence of obstruction.  Vascular calcifications overlie the left upper abdomen.  No definite pneumoperitoneum, pneumatosis or portal venous gas.  Multilevel thoracolumbar spine degenerative change.  IMPRESSION: 1.  No acute cardiopulmonary disease. 2.  Moderate colonic stool burden without evidence of obstruction.   Original Report Authenticated By: Tacey Ruiz, MD     Scheduled Meds: . cloNIDine  0.2 mg Transdermal Weekly  . insulin aspart  0-15 Units Subcutaneous TID WC  . latanoprost  1 drop Both Eyes QHS  . metoprolol  2.5 mg Intravenous Q6H   Continuous Infusions: . sodium chloride 75 mL/hr at 05/11/12 1203    Principal Problem:   Abdominal pain Active Problems:   Coronary artery disease   UTI (urinary tract infection)   Generalized weakness   Anemia   Hypokalemia   Diabetes mellitus   CHF, chronic   S/P placement of cardiac pacemaker   Constipation   Colonic mass    Time spent: 25 minutes.     Toriana Sponsel  [facility] Pager [phone]. If 7PM-7AM, please contact night-coverage at [email or web address], password [facility] 05/11/2012, 12:58 PM  LOS: 1 day

## 2012-05-12 LAB — CBC
HCT: 26.9 % — ABNORMAL LOW (ref 36.0–46.0)
Hemoglobin: 8.6 g/dL — ABNORMAL LOW (ref 12.0–15.0)
MCV: 85.1 fL (ref 78.0–100.0)
RBC: 3.16 MIL/uL — ABNORMAL LOW (ref 3.87–5.11)
RDW: 17.5 % — ABNORMAL HIGH (ref 11.5–15.5)
WBC: 4.2 10*3/uL (ref 4.0–10.5)

## 2012-05-12 LAB — BASIC METABOLIC PANEL
BUN: 20 mg/dL (ref 6–23)
CO2: 20 mEq/L (ref 19–32)
Chloride: 108 mEq/L (ref 96–112)
GFR calc Af Amer: 49 mL/min — ABNORMAL LOW (ref >90)
Potassium: 3.5 mEq/L (ref 3.5–5.1)

## 2012-05-12 LAB — OCCULT BLOOD X 1 CARD TO LAB, STOOL: Fecal Occult Bld: POSITIVE — AB

## 2012-05-12 LAB — GLUCOSE, CAPILLARY
Glucose-Capillary: 92 mg/dL (ref 70–99)
Glucose-Capillary: 99 mg/dL (ref 70–99)

## 2012-05-12 LAB — HEMOGLOBIN AND HEMATOCRIT, BLOOD
HCT: 27.6 % — ABNORMAL LOW (ref 36.0–46.0)
Hemoglobin: 8.8 g/dL — ABNORMAL LOW (ref 12.0–15.0)

## 2012-05-12 LAB — CEA: CEA: 2.5 ng/mL (ref 0.0–5.0)

## 2012-05-12 LAB — URINE CULTURE

## 2012-05-12 NOTE — Progress Notes (Signed)
Pt receiving IV Lopressor.  Dr. Phillips Odor notified via text page and asked for telemetry order.

## 2012-05-12 NOTE — Progress Notes (Signed)
Pt's BP 163/38.  Heart rate 62.  Paged Dr. Kerry Hough.  Advised to go ahead and give the Metoprolol and continue to monitor.  Order followed.

## 2012-05-12 NOTE — Care Management Note (Signed)
    Page 1 of 2   05/15/2012     3:13:32 PM   CARE MANAGEMENT NOTE 05/15/2012  Patient:  MITRA, DULING   Account Number:  [id number]  Date Initiated:  05/12/2012  Documentation initiated by:  Sharrie Rothman  Subjective/Objective Assessment:   Pt admitted from home with possible SBO and UTI. Pt lives with her son and has a daughter who provides 24 hour care for pt. Pt is semiambulatory at home with a walker and has a BSC for home use. Pts son would like HH RN, PT, and aide at dis.     Action/Plan:   Pt will need to be arranged a PCP and HH prior to discharge. Son would like pt to have a shower chair.   Anticipated DC Date:  05/15/2012   Anticipated DC Plan:  HOME W HOME HEALTH SERVICES      DC Planning Services  CM consult      [facility] Choice  HOME HEALTH   Choice offered to / List presented to:  C-2 HC POA / Guardian        HH arranged  HH-1 RN  HH-2 PT  HH-4 NURSE'S AIDE      HH agency  [facility]   Status of service:  Completed, signed off Medicare Important Message given?  YES (If response is "NO", the following Medicare IM given date fields will be blank) Date Medicare IM given:  05/15/2012 Date Additional Medicare IM given:    Discharge Disposition:  HOME W HOME HEALTH SERVICES  Per UR Regulation:    If discussed at Long Length of Stay Meetings, dates discussed:    Comments:  05/15/12 1510 Arlyss Queen, RN BSN CM Pt discharged home today with [facility] Alroy Bailiff of [facility] is aware and will collect the pts information from the chart. Pt HH services to start within 48 hours of discharge. Pts son and pts nurse aware of discharge arrangements.  05/12/12 8295 Arlyss Queen, RN BSN CM

## 2012-05-12 NOTE — Progress Notes (Signed)
[facility] PROGRESS NOTE  Cindy Abbott ZOX:[id number] DOB: October 03, 1922 DOA: 05/10/2012 PCP: No primary provider on file.  Assessment/Plan: 1-near obstructive "apple core" type lesion within the splenic flexure of the colon: Discussed case with Dr. Lovell Sheehan. I also discussed the case with the patient's son, Cindy Abbott. Cindy Abbott has significant anxiety and concerns regarding the patient's need for surgical intervention. It was explained to him that the patient would certainly be a high risk surgical candidate. He has questions regarding possible surgical procedures. I have left a message for Dr. Lovell Sheehan to call the patient's son and discuss this further. We will defer initiation of diet to GI/surgery. 2-UTI (urinary tract infection): Urine culture shows gram-negative rods. Continue Rocephin day 3.  3-Coronary artery disease: no chest pain. On Imdur. will hold for now  4-Anemia: Hemoglobin has been trending down. The patient has not received any Lovenox in the hospital. We'll check stool for occult blood since patient does have a history of GI bleeding in the past. Transfuse for hemoglobin less than  8. 5-Hypokalemia: resolved.  6-Diabetes mellitus: A-1c at 6.4. Will hold home regimen given NPO status . Continue with SSI for glycemic control  7-CHF, chronic: diastolic. Compensated. Echo 8/13 yields EF 65-70% with mild LVH. Monitor on IV fluids.  8-S/P placement of cardiac pacemaker  9-history of atrial fibrillation. She's not on Coumadin due to history of GI bleeding. 9-Constipation: in setting colon mass. Addressing # 1.   10-HTN: Continue with Clonidine. Metoprolol IV, holding parameters for HR.  11-DVT prophylaxis: SCD.    Code Status: Full Family Communication: Discussed with the patient's son, Cindy Abbott over the phone.  Disposition Plan: To be determine   Consultants: Surgery. GI  Procedures:  none  Antibiotics:  Ceftriaxone  HPI/Subjective: Denies any complaints. No  shortness of breath, no abdominal pain, no chest pain.  Objective: Filed Vitals:   05/11/12 1658 05/11/12 2158 05/12/12 0500 05/12/12 0611  BP: 187/45 191/59  129/62  Pulse: 60 58  58  Temp:  97.3 F (36.3 C)  97.7 F (36.5 C)  TempSrc:  Oral  Oral  Resp:  17  18  Height:      Weight:   60.102 kg (132 lb 8 oz) 60.5 kg (133 lb 6.1 oz)  SpO2:  96%  99%    Intake/Output Summary (Last 24 hours) at 05/12/12 1322 Last data filed at 05/12/12 0636  Gross per 24 hour  Intake      0 ml  Output      8 ml  Net     -8 ml   Filed Weights   05/11/12 0403 05/12/12 0500 05/12/12 0611  Weight: 59.557 kg (131 lb 4.8 oz) 60.102 kg (132 lb 8 oz) 60.5 kg (133 lb 6.1 oz)    Exam:   General:  Awake,alert, in no distress.   Cardiovascular: S 1, S 2 RRR  Respiratory: CTA  Abdomen: Bs present, soft, nontender. ,   Musculoskeletal: no edema.   Data Reviewed: Basic Metabolic Panel:  Recent Labs Lab 05/10/12 1052 05/11/12 0507 05/12/12 0447  NA 141 138 141  K 3.4* 3.9 3.5  CL 102 102 108  CO2 29 23 20   GLUCOSE 214* 140* 93  BUN 9 16 20   CREATININE 0.99 1.08 1.13*  CALCIUM 9.1 8.8 8.0*   Liver Function Tests:  Recent Labs Lab 05/10/12 1422 05/11/12 0507  AST 14 15  ALT <5 <5  ALKPHOS 55 58  BILITOT 1.0 1.1  PROT 6.7 6.0  ALBUMIN  3.1* 2.8*    Recent Labs Lab 05/10/12 1422  LIPASE 45   No results found for this basename: AMMONIA,  in the last 168 hours CBC:  Recent Labs Lab 05/10/12 1052 05/11/12 0507 05/12/12 0447  WBC 7.9 6.0 4.2  NEUTROABS 6.5  --   --   HGB 10.3* 9.7* 8.6*  HCT 32.6* 30.2* 26.9*  MCV 84.9 85.3 85.1  PLT 244 265 215   Cardiac Enzymes: No results found for this basename: CKTOTAL, CKMB, CKMBINDEX, TROPONINI,  in the last 168 hours BNP (last 3 results)  Recent Labs  08/18/11 1658  PROBNP 7537.0*   CBG:  Recent Labs Lab 05/11/12 0751 05/11/12 1147 05/11/12 1700 05/11/12 2203 05/12/12 0820  GLUCAP 120* 105* 114* 99 92     Recent Results (from the past 240 hour(s))  URINE CULTURE     Status: None   Collection Time    05/10/12 11:30 AM      Result Value Range Status   Specimen Description URINE, CATHETERIZED   Final   Special Requests NONE   Final   Culture  Setup Time 05/11/2012 01:10   Final   Colony Count >=100,000 COLONIES/ML   Final   Culture GRAM NEGATIVE RODS   Final   Report Status PENDING   Incomplete     Studies: Ct Abdomen Pelvis W Contrast  05/10/2012  *RADIOLOGY REPORT*  Clinical Data: Constipation, abdominal pain, altered mental status  CT ABDOMEN AND PELVIS WITH CONTRAST  Technique:  Multidetector CT imaging of the abdomen and pelvis was performed following the standard protocol during bolus administration of intravenous contrast.  Contrast: OMNIPAQUE IOHEXOL 300 MG/ML  SOLN  Comparison: None.  Findings:  Enteric contrast extends to the level of a markedly distended cecum.  The cecum, ascending and transverse colon appear dilated to the level of the splenic flexure where there is an apparent apple core type lesion, measuring approximately 3.2 x 2.9 cm (Coronal image 34; axial image 28, series 2).  This finding is associated with relative decompression of the downstream colon. There is a minimal amount of stranding about the cecum and ascending colon. No pneumoperitoneum, pneumatosis or portal venous gas.  No discrete/definable fluid collection.  Normal appearance of the appendix.  Normal contour.  There is an approximately 1.3 x 1.6 cm hypoattenuating lesion with the dome of the left lobe of the liver (image 14, series 2) which cannot be characterized as a hepatic cyst.  There is mild heterogeneity of the hepatic parenchyma which may be secondary to an element of venous congestion secondary to reflux of contrast into the hepatic venous system.  Normal gallbladder.  No intra or extrahepatic biliary duct dilatation. There is a small amount of fluid adjacent to the tip of the right lobe of the  liver.  There is symmetric enhancement and excretion of the bilateral kidneys.  Scattered bilateral sub centimeter hypoattenuating lesions are too small to accurate characterize are favored to represent a renal cyst.  No definite renal stones on the post contrast examination.  Note is made of extrarenal pelvises bilaterally.  No urinary obstruction or perinephric stranding. There is mild thickening of the crux of the left adrenal gland without discrete nodule.  Normal appearance of the right adrenal gland and spleen.  There is mild prominence of the pancreatic duct measuring approximately 6 mm in diameter at the level of the pancreatic head (images 25 and 27) however this is not associated with a discrete pancreatic mass or pancreatic atrophy.  No peripancreatic stranding.  Rather extensive atherosclerotic plaque within a normal caliber abdominal aorta.  There is extensive atherosclerotic plaque involving the origin and proximal aspects of the main branch vessels of the abdominal aorta.  The IMA appears patent on this non CT examination.  Incidental note is made of a retroaortic left sided renal vein.  No retroperitoneal, mesenteric, pelvic or inguinal lymphadenopathy.  Limited visualization of the lower thorax is negative for focal airspace opacity or pleural effusion.  Cardiomegaly, particular, there is enlargement of the right side of the heart.  Extensive calcifications within the mitral valve annulus.  A ventricular pacer lead terminates within the right ventricular apex. No pericardial effusion.  No acute or aggressive osseous abnormalities.  IMPRESSION:  1.  Findings worrisome for a near obstructive "apple core" type lesion within the splenic flexure of the colon with associated marked dilatation of the cecum, ascending and transverse colon. There is a minimal amount of stranding about the cecum and ascending colon without evidence of perforation or definable/drainable fluid collection.  2.  Approximately 1.6  cm hypoattenuating lesion within the dome of the left lobe of the liver.  In the setting of a possible colonic mass, this lesion is indeterminate and further evaluation with MRI in the non-emergent setting may be obtained as clinically indicated.  3.  Cardiomegaly with findings suggestive of possible mild right- sided heart failure.  Further evaluation with cardiac echo may be obtained if clinically indicated.  4.  Extensive atherosclerotic plaque within the normal caliber abdominal aorta.  5.  Nonspecific prominence of the pancreatic duct without associated pancreatic atrophy. No discrete pancreatic mass or peripancreatic stranding.  Further evaluation with a non emergent MRCP may be obtained as clinically indicated.   Original Report Authenticated By: Tacey Ruiz, MD     Scheduled Meds: . cefTRIAXone (ROCEPHIN)  IV  1 g Intravenous Q24H  . cloNIDine  0.2 mg Transdermal Weekly  . insulin aspart  0-15 Units Subcutaneous TID WC  . latanoprost  1 drop Both Eyes QHS  . metoprolol  2.5 mg Intravenous Q6H   Continuous Infusions: . sodium chloride 75 mL/hr at 05/12/12 0115    Principal Problem:   Abdominal pain Active Problems:   Coronary artery disease   UTI (urinary tract infection)   Generalized weakness   Anemia   Hypokalemia   Diabetes mellitus   CHF, chronic   S/P placement of cardiac pacemaker   Constipation   Colonic mass    Time spent: 25 minutes.     [facility]  [facility] Pager [phone]. If 7PM-7AM, please contact night-coverage at [email or web address], password [facility] 05/12/2012, 1:22 PM  LOS: 2 days

## 2012-05-12 NOTE — Consult Note (Signed)
Reason for Consult: Colonic obstruction secondary to apple core lesion Referring Physician: [facility], Dr. Roetta Sessions  Cindy Abbott is an 79 y.o. female.  HPI: Patient is an 79 year old black female with multiple medical problems who was brought to the emergency room from home for increasing abdominal pain and nausea. She was also noted to have a UTI. CT scan the abdomen was performed which revealed a near obstructing apple core lesion at the splenic flexure. She was also noted to have a 1.6 cm lesion in the dome and left lobe liver. Per family, she has had multiple polyps in the past. She'll large 1 that could not be removed by [facility]. [facility] had suggested a surgical resection but the family refused. She has since had a CVA.  Past Medical History  Diagnosis Date  . Coronary artery disease   . Diabetes mellitus   . Pacemaker   . Asthma   . Stroke, acute, thrombotic 08/19/2011    Unknown when in the past but she has received TPA for a CVA  . Anemia   . Colon polyps     Followed by GI at [facility]. h/o numerous polyps 2008 with large one that could not be removed endoscopically. patient opted against surgical resection. yearly colonoscopies until 2011. previously on coumadin but was held due to slow GI bleed related to large polyp  . A-fib     previously on coumadin but withheld due to chronic GI bleed    Past Surgical History  Procedure Laterality Date  . Cardiac surgery    . Coronary angioplasty with stent placement    . Coronary artery bypass graft    . Abdominal hysterectomy      Family History  Problem Relation Age of Onset  . Colon cancer Neg Hx     Social History:  reports that she has never smoked. She does not have any smokeless tobacco history on file. She reports that she does not drink alcohol or use illicit drugs.  Allergies:  Allergies  Allergen Reactions  . Lisinopril Anaphylaxis and Swelling    Medications: I have reviewed the patient's current  medications.  Results for orders placed during the hospital encounter of 05/10/12 (from the past 48 hour(s))  CBC WITH DIFFERENTIAL     Status: Abnormal   Collection Time    05/10/12 10:52 AM      Result Value Range   WBC 7.9  4.0 - 10.5 K/uL   RBC 3.84 (*) 3.87 - 5.11 MIL/uL   Hemoglobin 10.3 (*) 12.0 - 15.0 g/dL   HCT 16.1 (*) 09.6 - 04.5 %   MCV 84.9  78.0 - 100.0 fL   MCH 26.8  26.0 - 34.0 pg   MCHC 31.6  30.0 - 36.0 g/dL   RDW 40.9 (*) 81.1 - 91.4 %   Platelets 244  150 - 400 K/uL   Neutrophils Relative 82 (*) 43 - 77 %   Neutro Abs 6.5  1.7 - 7.7 K/uL   Lymphocytes Relative 10 (*) 12 - 46 %   Lymphs Abs 0.8  0.7 - 4.0 K/uL   Monocytes Relative 7  3 - 12 %   Monocytes Absolute 0.5  0.1 - 1.0 K/uL   Eosinophils Relative 1  0 - 5 %   Eosinophils Absolute 0.1  0.0 - 0.7 K/uL   Basophils Relative 0  0 - 1 %   Basophils Absolute 0.0  0.0 - 0.1 K/uL  BASIC METABOLIC PANEL     Status:  Abnormal   Collection Time    05/10/12 10:52 AM      Result Value Range   Sodium 141  135 - 145 mEq/L   Potassium 3.4 (*) 3.5 - 5.1 mEq/L   Chloride 102  96 - 112 mEq/L   CO2 29  19 - 32 mEq/L   Glucose, Bld 214 (*) 70 - 99 mg/dL   BUN 9  6 - 23 mg/dL   Creatinine, Ser 4.54  0.50 - 1.10 mg/dL   Calcium 9.1  8.4 - 09.8 mg/dL   GFR calc non Af Amer 50 (*) >90 mL/min   GFR calc Af Amer 58 (*) >90 mL/min   Comment:            The eGFR has been calculated     using the CKD EPI equation.     This calculation has not been     validated in all clinical     situations.     eGFR's persistently     <90 mL/min signify     possible Chronic Kidney Disease.  URINALYSIS, ROUTINE W REFLEX MICROSCOPIC     Status: Abnormal   Collection Time    05/10/12 11:30 AM      Result Value Range   Color, Urine YELLOW  YELLOW   APPearance HAZY (*) CLEAR   Specific Gravity, Urine 1.020  1.005 - 1.030   pH 7.0  5.0 - 8.0   Glucose, UA NEGATIVE  NEGATIVE mg/dL   Hgb urine dipstick SMALL (*) NEGATIVE   Bilirubin  Urine NEGATIVE  NEGATIVE   Ketones, ur NEGATIVE  NEGATIVE mg/dL   Protein, ur 119 (*) NEGATIVE mg/dL   Urobilinogen, UA 1.0  0.0 - 1.0 mg/dL   Nitrite NEGATIVE  NEGATIVE   Leukocytes, UA LARGE (*) NEGATIVE  URINE CULTURE     Status: None   Collection Time    05/10/12 11:30 AM      Result Value Range   Specimen Description URINE, CATHETERIZED     Special Requests NONE     Culture  Setup Time 05/11/2012 01:10     Colony Count >=100,000 COLONIES/ML     Culture GRAM NEGATIVE RODS     Report Status PENDING    URINE MICROSCOPIC-ADD ON     Status: Abnormal   Collection Time    05/10/12 11:30 AM      Result Value Range   WBC, UA TOO NUMEROUS TO COUNT  <3 WBC/hpf   RBC / HPF TOO NUMEROUS TO COUNT  <3 RBC/hpf   Bacteria, UA MANY (*) RARE  HEPATIC FUNCTION PANEL     Status: Abnormal   Collection Time    05/10/12  2:22 PM      Result Value Range   Total Protein 6.7  6.0 - 8.3 g/dL   Albumin 3.1 (*) 3.5 - 5.2 g/dL   AST 14  0 - 37 U/L   ALT <5  0 - 35 U/L   Alkaline Phosphatase 55  39 - 117 U/L   Total Bilirubin 1.0  0.3 - 1.2 mg/dL   Bilirubin, Direct 0.2  0.0 - 0.3 mg/dL   Indirect Bilirubin 0.8  0.3 - 0.9 mg/dL  LIPASE, BLOOD     Status: None   Collection Time    05/10/12  2:22 PM      Result Value Range   Lipase 45  11 - 59 U/L  HEMOGLOBIN A1C     Status: Abnormal   Collection Time  05/10/12  4:24 PM      Result Value Range   Hemoglobin A1C 6.4 (*) <5.7 %   Comment: (NOTE)                                                                               According to the ADA Clinical Practice Recommendations for 2011, when     HbA1c is used as a screening test:      >=6.5%   Diagnostic of Diabetes Mellitus               (if abnormal result is confirmed)     5.7-6.4%   Increased risk of developing Diabetes Mellitus     References:Diagnosis and Classification of Diabetes Mellitus,Diabetes     Care,2011,34(Suppl 1):S62-S69 and Standards of Medical Care in             Diabetes -  2011,Diabetes Care,2011,34 (Suppl 1):S11-S61.   Mean Plasma Glucose 137 (*) <117 mg/dL  GLUCOSE, CAPILLARY     Status: Abnormal   Collection Time    05/10/12  4:53 PM      Result Value Range   Glucose-Capillary 144 (*) 70 - 99 mg/dL   Comment 1 Notify RN     Comment 2 Documented in Chart    GLUCOSE, CAPILLARY     Status: Abnormal   Collection Time    05/10/12  8:56 PM      Result Value Range   Glucose-Capillary 100 (*) 70 - 99 mg/dL  GLUCOSE, CAPILLARY     Status: Abnormal   Collection Time    05/11/12 12:43 AM      Result Value Range   Glucose-Capillary 113 (*) 70 - 99 mg/dL  COMPREHENSIVE METABOLIC PANEL     Status: Abnormal   Collection Time    05/11/12  5:07 AM      Result Value Range   Sodium 138  135 - 145 mEq/L   Potassium 3.9  3.5 - 5.1 mEq/L   Chloride 102  96 - 112 mEq/L   CO2 23  19 - 32 mEq/L   Glucose, Bld 140 (*) 70 - 99 mg/dL   BUN 16  6 - 23 mg/dL   Creatinine, Ser 1.61  0.50 - 1.10 mg/dL   Calcium 8.8  8.4 - 09.6 mg/dL   Total Protein 6.0  6.0 - 8.3 g/dL   Albumin 2.8 (*) 3.5 - 5.2 g/dL   AST 15  0 - 37 U/L   ALT <5  0 - 35 U/L   Alkaline Phosphatase 58  39 - 117 U/L   Total Bilirubin 1.1  0.3 - 1.2 mg/dL   GFR calc non Af Amer 45 (*) >90 mL/min   GFR calc Af Amer 52 (*) >90 mL/min   Comment:            The eGFR has been calculated     using the CKD EPI equation.     This calculation has not been     validated in all clinical     situations.     eGFR's persistently     <90 mL/min signify     possible Chronic Kidney Disease.  CBC  Status: Abnormal   Collection Time    05/11/12  5:07 AM      Result Value Range   WBC 6.0  4.0 - 10.5 K/uL   RBC 3.54 (*) 3.87 - 5.11 MIL/uL   Hemoglobin 9.7 (*) 12.0 - 15.0 g/dL   HCT 40.9 (*) 81.1 - 91.4 %   MCV 85.3  78.0 - 100.0 fL   MCH 27.4  26.0 - 34.0 pg   MCHC 32.1  30.0 - 36.0 g/dL   RDW 78.2 (*) 95.6 - 21.3 %   Platelets 265  150 - 400 K/uL  GLUCOSE, CAPILLARY     Status: Abnormal   Collection Time     05/11/12  7:51 AM      Result Value Range   Glucose-Capillary 120 (*) 70 - 99 mg/dL   Comment 1 Notify RN    GLUCOSE, CAPILLARY     Status: Abnormal   Collection Time    05/11/12 11:47 AM      Result Value Range   Glucose-Capillary 105 (*) 70 - 99 mg/dL   Comment 1 Notify RN    GLUCOSE, CAPILLARY     Status: Abnormal   Collection Time    05/11/12  5:00 PM      Result Value Range   Glucose-Capillary 114 (*) 70 - 99 mg/dL   Comment 1 Documented in Chart     Comment 2 Notify RN    GLUCOSE, CAPILLARY     Status: None   Collection Time    05/11/12 10:03 PM      Result Value Range   Glucose-Capillary 99  70 - 99 mg/dL  CBC     Status: Abnormal   Collection Time    05/12/12  4:47 AM      Result Value Range   WBC 4.2  4.0 - 10.5 K/uL   RBC 3.16 (*) 3.87 - 5.11 MIL/uL   Hemoglobin 8.6 (*) 12.0 - 15.0 g/dL   HCT 08.6 (*) 57.8 - 46.9 %   MCV 85.1  78.0 - 100.0 fL   MCH 27.2  26.0 - 34.0 pg   MCHC 32.0  30.0 - 36.0 g/dL   RDW 62.9 (*) 52.8 - 41.3 %   Platelets 215  150 - 400 K/uL  BASIC METABOLIC PANEL     Status: Abnormal   Collection Time    05/12/12  4:47 AM      Result Value Range   Sodium 141  135 - 145 mEq/L   Potassium 3.5  3.5 - 5.1 mEq/L   Chloride 108  96 - 112 mEq/L   CO2 20  19 - 32 mEq/L   Glucose, Bld 93  70 - 99 mg/dL   BUN 20  6 - 23 mg/dL   Creatinine, Ser 2.44 (*) 0.50 - 1.10 mg/dL   Calcium 8.0 (*) 8.4 - 10.5 mg/dL   GFR calc non Af Amer 42 (*) >90 mL/min   GFR calc Af Amer 49 (*) >90 mL/min   Comment:            The eGFR has been calculated     using the CKD EPI equation.     This calculation has not been     validated in all clinical     situations.     eGFR's persistently     <90 mL/min signify     possible Chronic Kidney Disease.  GLUCOSE, CAPILLARY     Status: None   Collection Time  05/12/12  8:20 AM      Result Value Range   Glucose-Capillary 92  70 - 99 mg/dL    Ct Abdomen Pelvis W Contrast  05/10/2012  *RADIOLOGY REPORT*  Clinical  Data: Constipation, abdominal pain, altered mental status  CT ABDOMEN AND PELVIS WITH CONTRAST  Technique:  Multidetector CT imaging of the abdomen and pelvis was performed following the standard protocol during bolus administration of intravenous contrast.  Contrast: OMNIPAQUE IOHEXOL 300 MG/ML  SOLN  Comparison: None.  Findings:  Enteric contrast extends to the level of a markedly distended cecum.  The cecum, ascending and transverse colon appear dilated to the level of the splenic flexure where there is an apparent apple core type lesion, measuring approximately 3.2 x 2.9 cm (Coronal image 34; axial image 28, series 2).  This finding is associated with relative decompression of the downstream colon. There is a minimal amount of stranding about the cecum and ascending colon. No pneumoperitoneum, pneumatosis or portal venous gas.  No discrete/definable fluid collection.  Normal appearance of the appendix.  Normal contour.  There is an approximately 1.3 x 1.6 cm hypoattenuating lesion with the dome of the left lobe of the liver (image 14, series 2) which cannot be characterized as a hepatic cyst.  There is mild heterogeneity of the hepatic parenchyma which may be secondary to an element of venous congestion secondary to reflux of contrast into the hepatic venous system.  Normal gallbladder.  No intra or extrahepatic biliary duct dilatation. There is a small amount of fluid adjacent to the tip of the right lobe of the liver.  There is symmetric enhancement and excretion of the bilateral kidneys.  Scattered bilateral sub centimeter hypoattenuating lesions are too small to accurate characterize are favored to represent a renal cyst.  No definite renal stones on the post contrast examination.  Note is made of extrarenal pelvises bilaterally.  No urinary obstruction or perinephric stranding. There is mild thickening of the crux of the left adrenal gland without discrete nodule.  Normal appearance of the right  adrenal gland and spleen.  There is mild prominence of the pancreatic duct measuring approximately 6 mm in diameter at the level of the pancreatic head (images 25 and 27) however this is not associated with a discrete pancreatic mass or pancreatic atrophy.  No peripancreatic stranding.  Rather extensive atherosclerotic plaque within a normal caliber abdominal aorta.  There is extensive atherosclerotic plaque involving the origin and proximal aspects of the main branch vessels of the abdominal aorta.  The IMA appears patent on this non CT examination.  Incidental note is made of a retroaortic left sided renal vein.  No retroperitoneal, mesenteric, pelvic or inguinal lymphadenopathy.  Limited visualization of the lower thorax is negative for focal airspace opacity or pleural effusion.  Cardiomegaly, particular, there is enlargement of the right side of the heart.  Extensive calcifications within the mitral valve annulus.  A ventricular pacer lead terminates within the right ventricular apex. No pericardial effusion.  No acute or aggressive osseous abnormalities.  IMPRESSION:  1.  Findings worrisome for a near obstructive "apple core" type lesion within the splenic flexure of the colon with associated marked dilatation of the cecum, ascending and transverse colon. There is a minimal amount of stranding about the cecum and ascending colon without evidence of perforation or definable/drainable fluid collection.  2.  Approximately 1.6 cm hypoattenuating lesion within the dome of the left lobe of the liver.  In the setting of a possible  colonic mass, this lesion is indeterminate and further evaluation with MRI in the non-emergent setting may be obtained as clinically indicated.  3.  Cardiomegaly with findings suggestive of possible mild right- sided heart failure.  Further evaluation with cardiac echo may be obtained if clinically indicated.  4.  Extensive atherosclerotic plaque within the normal caliber abdominal aorta.   5.  Nonspecific prominence of the pancreatic duct without associated pancreatic atrophy. No discrete pancreatic mass or peripancreatic stranding.  Further evaluation with a non emergent MRCP may be obtained as clinically indicated.   Original Report Authenticated By: Tacey Ruiz, MD    Dg Abd Acute W/chest  05/10/2012  *RADIOLOGY REPORT*  Clinical Data: Abdominal pain, altered mental status, constipation  ACUTE ABDOMEN SERIES (ABDOMEN 2 VIEW & CHEST 1 VIEW)  Comparison: Chest radiograph - 01/11/2012  Findings:  Grossly unchanged cardiac silhouette and mediastinal contours post median sternotomy and CABG.  There are extensive calcifications within the mitral valve annulus.  Stable positioning of support apparatus.  Mild cephalization of flow without frank evidence of edema.  No focal airspace opacity.  No definite pleural effusion or pneumothorax.  Moderate colonic stool burden (in particular within the rectal vault) without evidence of obstruction.  Vascular calcifications overlie the left upper abdomen.  No definite pneumoperitoneum, pneumatosis or portal venous gas.  Multilevel thoracolumbar spine degenerative change.  IMPRESSION: 1.  No acute cardiopulmonary disease. 2.  Moderate colonic stool burden without evidence of obstruction.   Original Report Authenticated By: Tacey Ruiz, MD     ROS: See chart Blood pressure 129/62, pulse 58, temperature 97.7 F (36.5 C), temperature source Oral, resp. rate 18, height 5\' 2"  (1.575 m), weight 60.5 kg (133 lb 6.1 oz), SpO2 99.00%. Physical Exam: Pleasant black female in no acute distress. Lungs clear to auscultation with equal breath sounds bilaterally. Heart examination reveals an irregular rhythm with a systolic murmur appreciated at the apex. Abdomen is mildly distended but not tense. I did not feel a palpable mass. No hernias are noted.  Echo done in August of 2013 reveals mitral valve calcifications, though her ejection fraction was within normal  limits.   Assessment/Plan: Impression: Near obstructing colonic lesion at splenic flexure. Possible right-sided heart failure do to mitral regurgitation, chronic atrial fibrillation, history of CVA, UTI Plan: Patient is a high risk surgical candidate, whether or not he received with a resection or diverting colostomy. I do not think she is a colonic stent placement candidate given the lesion at the splenic flexure. I discussed this with Dr. Gwendalyn Ege. He will discuss with the son as to what the family would like to do. Initially he has stated that he does not want surgical intervention. We'll follow with you.  Cindy Abbott A 05/12/2012, 9:19 AM

## 2012-05-12 NOTE — Progress Notes (Signed)
Pt's urine culture results back this afternoon.  Pt's first hemoccult stool positive.  Dr. Kerry Hough notified of both via text page.

## 2012-05-13 DIAGNOSIS — K5900 Constipation, unspecified: Secondary | ICD-10-CM

## 2012-05-13 LAB — BASIC METABOLIC PANEL WITH GFR
BUN: 21 mg/dL (ref 6–23)
CO2: 17 meq/L — ABNORMAL LOW (ref 19–32)
Calcium: 8.5 mg/dL (ref 8.4–10.5)
Chloride: 110 meq/L (ref 96–112)
Creatinine, Ser: 1.08 mg/dL (ref 0.50–1.10)
GFR calc Af Amer: 52 mL/min — ABNORMAL LOW
GFR calc non Af Amer: 45 mL/min — ABNORMAL LOW
Glucose, Bld: 71 mg/dL (ref 70–99)
Potassium: 3.6 meq/L (ref 3.5–5.1)
Sodium: 144 meq/L (ref 135–145)

## 2012-05-13 LAB — CBC
HCT: 27.3 % — ABNORMAL LOW (ref 36.0–46.0)
Hemoglobin: 8.7 g/dL — ABNORMAL LOW (ref 12.0–15.0)
MCH: 27.1 pg (ref 26.0–34.0)
MCHC: 31.9 g/dL (ref 30.0–36.0)
MCV: 85 fL (ref 78.0–100.0)
Platelets: 231 10*3/uL (ref 150–400)
RBC: 3.21 MIL/uL — ABNORMAL LOW (ref 3.87–5.11)
RDW: 17.1 % — ABNORMAL HIGH (ref 11.5–15.5)
WBC: 4.5 10*3/uL (ref 4.0–10.5)

## 2012-05-13 LAB — GLUCOSE, CAPILLARY
Glucose-Capillary: 73 mg/dL (ref 70–99)
Glucose-Capillary: 135 mg/dL — ABNORMAL HIGH (ref 70–99)

## 2012-05-13 MED ORDER — ALBUTEROL SULFATE (5 MG/ML) 0.5% IN NEBU
2.5000 mg | INHALATION_SOLUTION | Freq: Four times a day (QID) | RESPIRATORY_TRACT | Status: DC | PRN
  Administered 2012-05-13: 2.5 mg via RESPIRATORY_TRACT
  Filled 2012-05-13: qty 0.5

## 2012-05-13 MED ORDER — MILK AND MOLASSES ENEMA
Freq: Once | RECTAL | Status: AC
  Administered 2012-05-13: 15:00:00 via RECTAL

## 2012-05-13 MED ORDER — BIOTENE DRY MOUTH MT LIQD
15.0000 mL | Freq: Two times a day (BID) | OROMUCOSAL | Status: DC
  Administered 2012-05-13 – 2012-05-15 (×5): 15 mL via OROMUCOSAL

## 2012-05-13 MED ORDER — POLYETHYLENE GLYCOL 3350 17 G PO PACK
17.0000 g | PACK | Freq: Every day | ORAL | Status: DC
  Administered 2012-05-13 – 2012-05-15 (×3): 17 g via ORAL
  Filled 2012-05-13 (×3): qty 1

## 2012-05-13 NOTE — Progress Notes (Signed)
[facility] PROGRESS NOTE  Bindi Klomp ZOX:[id number] DOB: 1922-03-24 DOA: 05/10/2012 PCP: No primary provider on file.  Assessment/Plan: 1-near obstructive "apple core" type lesion within the splenic flexure of the colon: Case discussed with Dr. Lovell Sheehan. Plan is to try cathartics and edemas to relieve partial obstruction. She may end up needing diverting transverse loop colostomy.  Timing of this procedure per the patient's family and surgery.  She has been started on a clear diet. 2-UTI (urinary tract infection): Urine culture shows E coli. Continue Rocephin day 4.  3-Coronary artery disease: no chest pain. On Imdur. will hold for now  4-Anemia: Hemoglobin has been trending down. The patient has not received any Lovenox in the hospital. Her stool occult blood is positive which is likely due to erosion from her underlying colonic neoplasm. Transfuse for hemoglobin less than  8. 5-Hypokalemia: resolved.  6-Diabetes mellitus: A-1c at 6.4. Will hold home regimen given NPO status . Continue with SSI for glycemic control  7-CHF, chronic: diastolic. Compensated. Echo 8/13 yields EF 65-70% with mild LVH. Monitor on IV fluids.  8-S/P placement of cardiac pacemaker  9-history of atrial fibrillation. She's not on Coumadin due to history of GI bleeding. 9-Constipation: in setting colon mass. Addressing # 1.   10-HTN: Continue with Clonidine. Metoprolol IV, holding parameters for HR.  11-DVT prophylaxis: SCD.    Code Status: Full Family Communication: Disposition Plan: To be determine   Consultants: Surgery. GI  Procedures:  none  Antibiotics:  Ceftriaxone  HPI/Subjective: She complains of thirst.  Has intermittent abdominal pain  Objective: Filed Vitals:   05/12/12 2047 05/12/12 2100 05/13/12 0553 05/13/12 0852  BP:  170/46 188/47 170/40  Pulse: 60 60 59 61  Temp:  99.4 F (37.4 C) 97.9 F (36.6 C) 99.1 F (37.3 C)  TempSrc:  Oral Oral Oral  Resp: 20 20 20 20   Height:       Weight:   59.9 kg (132 lb 0.9 oz)   SpO2: 95% 100% 100% 98%    Intake/Output Summary (Last 24 hours) at 05/13/12 1144 Last data filed at 05/13/12 0900  Gross per 24 hour  Intake 1384.17 ml  Output      2 ml  Net 1382.17 ml   Filed Weights   05/12/12 0500 05/12/12 0611 05/13/12 0553  Weight: 60.102 kg (132 lb 8 oz) 60.5 kg (133 lb 6.1 oz) 59.9 kg (132 lb 0.9 oz)    Exam:   General:  Awake,alert, in no distress.   Cardiovascular: S 1, S 2 RRR  Respiratory: crackles at bases  Abdomen: Bs present, soft, nontender. ,   Musculoskeletal: no edema.   Data Reviewed: Basic Metabolic Panel:  Recent Labs Lab 05/10/12 1052 05/11/12 0507 05/12/12 0447 05/13/12 0442  NA 141 138 141 144  K 3.4* 3.9 3.5 3.6  CL 102 102 108 110  CO2 29 23 20  17*  GLUCOSE 214* 140* 93 71  BUN 9 16 20 21   CREATININE 0.99 1.08 1.13* 1.08  CALCIUM 9.1 8.8 8.0* 8.5   Liver Function Tests:  Recent Labs Lab 05/10/12 1422 05/11/12 0507  AST 14 15  ALT <5 <5  ALKPHOS 55 58  BILITOT 1.0 1.1  PROT 6.7 6.0  ALBUMIN 3.1* 2.8*    Recent Labs Lab 05/10/12 1422  LIPASE 45   No results found for this basename: AMMONIA,  in the last 168 hours CBC:  Recent Labs Lab 05/10/12 1052 05/11/12 0507 05/12/12 0447 05/12/12 2021 05/13/12 0442  WBC 7.9 6.0  4.2  --  4.5  NEUTROABS 6.5  --   --   --   --   HGB 10.3* 9.7* 8.6* 8.8* 8.7*  HCT 32.6* 30.2* 26.9* 27.6* 27.3*  MCV 84.9 85.3 85.1  --  85.0  PLT 244 265 215  --  231   Cardiac Enzymes: No results found for this basename: CKTOTAL, CKMB, CKMBINDEX, TROPONINI,  in the last 168 hours BNP (last 3 results)  Recent Labs  08/18/11 1658  PROBNP 7537.0*   CBG:  Recent Labs Lab 05/11/12 2203 05/12/12 0820 05/12/12 1213 05/12/12 1644 05/12/12 2135  GLUCAP 99 92 82 99 84    Recent Results (from the past 240 hour(s))  URINE CULTURE     Status: None   Collection Time    05/10/12 11:30 AM      Result Value Range Status   Specimen  Description URINE, CATHETERIZED   Final   Special Requests NONE   Final   Culture  Setup Time 05/11/2012 01:10   Final   Colony Count >=100,000 COLONIES/ML   Final   Culture ESCHERICHIA COLI   Final   Report Status 05/12/2012 FINAL   Final   Organism ID, Bacteria ESCHERICHIA COLI   Final     Studies: No results found.  Scheduled Meds: . cefTRIAXone (ROCEPHIN)  IV  1 g Intravenous Q24H  . cloNIDine  0.2 mg Transdermal Weekly  . insulin aspart  0-15 Units Subcutaneous TID WC  . latanoprost  1 drop Both Eyes QHS  . metoprolol  2.5 mg Intravenous Q6H  . milk and molasses   Rectal Once  . polyethylene glycol  17 g Oral Daily   Continuous Infusions: . sodium chloride 50 mL/hr at 05/12/12 1718    Principal Problem:   Abdominal pain Active Problems:   Coronary artery disease   UTI (urinary tract infection)   Generalized weakness   Anemia   Hypokalemia   Diabetes mellitus   CHF, chronic   S/P placement of cardiac pacemaker   Constipation   Colonic mass    Time spent: 25 minutes.     [facility]  [facility] Pager [phone]. If 7PM-7AM, please contact night-coverage at [email or web address], password [facility] 05/13/2012, 11:44 AM  LOS: 3 days

## 2012-05-13 NOTE — Progress Notes (Signed)
  Subjective: Sitting in a chair appearing comfortable.  Objective: Vital signs in last 24 hours: Temp:  [97.8 F (36.6 C)-99.4 F (37.4 C)] 99.1 F (37.3 C) (05/10 0852) Pulse Rate:  [59-62] 61 (05/10 0852) Resp:  [18-20] 20 (05/10 0852) BP: (160-188)/(36-50) 170/40 mmHg (05/10 0852) SpO2:  [95 %-100 %] 98 % (05/10 0852) Weight:  [59.9 kg (132 lb 0.9 oz)] 59.9 kg (132 lb 0.9 oz) (05/10 0553) Last BM Date: 05/12/12  Intake/Output from previous day: 05/09 0701 - 05/10 0700 In: 1384.2 [I.V.:1284.2; IV Piggyback:100] Out: 2 [Urine:2] Intake/Output this shift:    GI: Soft, some nonspecific tenderness along the right side of abdomen. No rigidity is noted. Seems to be less distended today.  Lab Results:   Recent Labs  05/12/12 0447 05/12/12 2021 05/13/12 0442  WBC 4.2  --  4.5  HGB 8.6* 8.8* 8.7*  HCT 26.9* 27.6* 27.3*  PLT 215  --  231   BMET  Recent Labs  05/12/12 0447 05/13/12 0442  NA 141 144  K 3.5 3.6  CL 108 110  CO2 20 17*  GLUCOSE 93 71  BUN 20 21  CREATININE 1.13* 1.08  CALCIUM 8.0* 8.5   PT/INR No results found for this basename: LABPROT, INR,  in the last 72 hours  Studies/Results: No results found.  Anti-infectives: Anti-infectives   Start     Dose/Rate Route Frequency Ordered Stop   05/11/12 1500  cefTRIAXone (ROCEPHIN) 1 g in dextrose 5 % 50 mL IVPB     1 g 100 mL/hr over 30 Minutes Intravenous Every 24 hours 05/11/12 1301     05/10/12 1730  cefTRIAXone (ROCEPHIN) 1 g in dextrose 5 % 50 mL IVPB     1 g 100 mL/hr over 30 Minutes Intravenous  Once 05/10/12 1632 05/10/12 1835   05/10/12 1200  cefTRIAXone (ROCEPHIN) 1 g in dextrose 5 % 50 mL IVPB     1 g 100 mL/hr over 30 Minutes Intravenous  Once 05/10/12 1152 05/10/12 1303      Assessment/Plan: Impression: Partial colonic obstruction secondary to unspecified neoplasm at splenic flexure. Had a long discussion with patient's primary caregiver/son. I told him that she was a high risk  surgical candidate, though she may end up needing a diverting transverse loop colostomy. He states that do can offered surgery to them in 2008, but they refused as they felt she was too weak. I told him that we are now 6 years out from that decision and that she has not any stronger. Will try cathartics and enemas to relieve the partial obstruction. Agree with echo of heart on Monday.  LOS: 3 days    Kohner Orlick A 05/13/2012

## 2012-05-13 NOTE — Progress Notes (Signed)
05/13/12 0930 Patient assisted up to chair this morning. Tolerated fairly well, generalized weakness. States comfortable sitting up. Call light and phone within reach, instructed to call for assistance as needed and not attempt getting up on her own. States will call and wait for assistance. Earnstine Regal, RN

## 2012-05-13 NOTE — Progress Notes (Signed)
[id number] Milk and molasses enema administered as ordered. Pt tolerated fairly well, unable to retain enema but for a few minutes. Small amount of dark, greenish-black stool. Unable to send for hemoccult since mixed with enema. Nursing to monitor. Earnstine Regal, RN

## 2012-05-14 DIAGNOSIS — E119 Type 2 diabetes mellitus without complications: Secondary | ICD-10-CM

## 2012-05-14 LAB — BASIC METABOLIC PANEL
BUN: 15 mg/dL (ref 6–23)
CO2: 19 mEq/L (ref 19–32)
Chloride: 110 mEq/L (ref 96–112)
Creatinine, Ser: 0.88 mg/dL (ref 0.50–1.10)
Glucose, Bld: 141 mg/dL — ABNORMAL HIGH (ref 70–99)
Potassium: 4 mEq/L (ref 3.5–5.1)

## 2012-05-14 LAB — GLUCOSE, CAPILLARY
Glucose-Capillary: 141 mg/dL — ABNORMAL HIGH (ref 70–99)
Glucose-Capillary: 109 mg/dL — ABNORMAL HIGH (ref 70–99)
Glucose-Capillary: 157 mg/dL — ABNORMAL HIGH (ref 70–99)

## 2012-05-14 LAB — CBC
HCT: 32 % — ABNORMAL LOW (ref 36.0–46.0)
Hemoglobin: 10.3 g/dL — ABNORMAL LOW (ref 12.0–15.0)
MCV: 84 fL (ref 78.0–100.0)
RBC: 3.81 MIL/uL — ABNORMAL LOW (ref 3.87–5.11)
RDW: 17 % — ABNORMAL HIGH (ref 11.5–15.5)
WBC: 5.6 10*3/uL (ref 4.0–10.5)

## 2012-05-14 MED ORDER — ISOSORBIDE MONONITRATE ER 30 MG PO TB24
30.0000 mg | ORAL_TABLET | Freq: Every day | ORAL | Status: DC
  Administered 2012-05-14 – 2012-05-15 (×2): 30 mg via ORAL
  Filled 2012-05-14 (×2): qty 1

## 2012-05-14 MED ORDER — CIPROFLOXACIN HCL 250 MG PO TABS
500.0000 mg | ORAL_TABLET | Freq: Two times a day (BID) | ORAL | Status: DC
  Administered 2012-05-14 – 2012-05-15 (×3): 500 mg via ORAL
  Filled 2012-05-14 (×3): qty 2

## 2012-05-14 MED ORDER — METOPROLOL TARTRATE 50 MG PO TABS
50.0000 mg | ORAL_TABLET | Freq: Two times a day (BID) | ORAL | Status: DC
  Administered 2012-05-14 – 2012-05-15 (×3): 50 mg via ORAL
  Filled 2012-05-14 (×3): qty 1

## 2012-05-14 NOTE — Progress Notes (Signed)
  Subjective: No complaints of pain. Unable to retain the last enema.  Objective: Vital signs in last 24 hours: Temp:  [97.9 F (36.6 C)-98.2 F (36.8 C)] 98.1 F (36.7 C) (05/11 0550) Pulse Rate:  [60-61] 60 (05/10 2131) Resp:  [20] 20 (05/10 2131) BP: (151-215)/(40-62) 197/44 mmHg (05/11 0550) SpO2:  [92 %-100 %] 100 % (05/11 0550) Weight:  [57.3 kg (126 lb 5.2 oz)] 57.3 kg (126 lb 5.2 oz) (05/11 0500) Last BM Date: 05/12/12  Intake/Output from previous day: 05/10 0701 - 05/11 0700 In: 574.2 [P.O.:240; I.V.:284.2; IV Piggyback:50] Out: 1 [Urine:1] Intake/Output this shift:    General appearance: cooperative and no distress GI: Soft, less distended. No rigidity noted. Minimal tenderness to deep palpation along the right side the abdomen. Bowel sounds appreciated.  Lab Results:   Recent Labs  05/13/12 0442 05/14/12 0728  WBC 4.5 5.6  HGB 8.7* 10.3*  HCT 27.3* 32.0*  PLT 231 225   BMET  Recent Labs  05/13/12 0442 05/14/12 0728  NA 144 141  K 3.6 4.0  CL 110 110  CO2 17* 19  GLUCOSE 71 141*  BUN 21 15  CREATININE 1.08 0.88  CALCIUM 8.5 8.6   PT/INR No results found for this basename: LABPROT, INR,  in the last 72 hours  Studies/Results: No results found.  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 Son would like to take the patient home and  avoid surgery at this point. Would still get echo in a.m. as I suspect the patient will be back and in need of a diverting transverse loop colostomy. Continue MiraLAX. Will advance to full liquid diet.  LOS: 4 days    Jedrick Hutcherson A 05/14/2012

## 2012-05-14 NOTE — Progress Notes (Signed)
[facility] PROGRESS NOTE  Cindy Abbott ZOX:[id number] DOB: [birth year] DOA: 05/10/2012 PCP: No primary provider on file.  Brief Narrative: This is an 79 y/o female who presented to the hospital with complaints of abdominal pain.  CT of the abdomen pelvis revealed an apple core type lesion in the colon with partial obstruction.  Patient was seen by GI and General Surgery.  It was felt that patient would likely need a diverting loop colostomy.  This was discussed with her son, but he wishes to hold off on surgery for now.  Since the patient is having continued bowel movements and she is tolerating a liquid diet, it was felt that patient can discharge home and follow up when needed if bowel symptoms recur. Ultimately, she will likely need surgical intervention to relieve obstruction. The patient does have significant cardiac history and will undergo echo tomorrow to evaluate LV function. Echo is being obtained to further assess her cardiac risk for surgery.   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 Lovell Sheehan and patient's son wishes to take the patient home.  Her diet will be advanced, but she will likely need surgical intervention in the future. She is having bowel movements. 2-UTI (urinary tract infection): Urine culture shows E coli. Day 5 of antibiotics today.  Change to oral cipro. 3-Coronary artery disease: no chest pain. On Imdur. will hold for now  4-Anemia: Hemoglobin has been trending down. The patient has not received any Lovenox in the hospital. Her stool occult blood is positive which is likely due to erosion from her underlying colonic neoplasm. Transfuse for hemoglobin less than  8. 5-Hypokalemia: resolved.  6-Diabetes mellitus: A-1c at 6.4. Will hold home regimen given NPO status . Continue with SSI for glycemic control  7-CHF, chronic: diastolic. Compensated. Echo 8/13 yields EF 65-70% with mild LVH. Monitor  on IV fluids.  8-S/P placement of cardiac pacemaker  9-history of atrial fibrillation. She's not on Coumadin due to history of GI bleeding. 9-Constipation: in setting colon mass. Addressing # 1.   10-HTN: Continue with Clonidine. Change lopressor to po. 11-DVT prophylaxis: SCD.    Code Status: Full Family Communication: Disposition Plan: probable discharge home tomorrow after echo if she is tolerating diet.   Consultants: Surgery. GI  Procedures:  none  Antibiotics:  Ceftriaxone  HPI/Subjective: Denies any abdominal pain, no shortness of breath.  Objective: Filed Vitals:   05/13/12 1820 05/13/12 2131 05/14/12 0500 05/14/12 0550  BP: 179/40 159/42  197/44  Pulse: 60 60    Temp:  98.2 F (36.8 C)  98.1 F (36.7 C)  TempSrc:  Oral  Oral  Resp:  20    Height:      Weight:   57.3 kg (126 lb 5.2 oz)   SpO2:  100%  100%    Intake/Output Summary (Last 24 hours) at 05/14/12 1019 Last data filed at 05/13/12 1730  Gross per 24 hour  Intake 574.17 ml  Output      0 ml  Net 574.17 ml   Filed Weights   05/12/12 0611 05/13/12 0553 05/14/12 0500  Weight: 60.5 kg (133 lb 6.1 oz) 59.9 kg (132 lb 0.9 oz) 57.3 kg (126 lb 5.2 oz)    Exam:   General:  Awake,alert, in no distress.   Cardiovascular: S 1, S 2 RRR  Respiratory: crackles at bases  Abdomen: Bs present, soft, nontender. ,   Musculoskeletal: no edema.   Data Reviewed: Basic Metabolic  Panel:  Recent Labs Lab 05/10/12 1052 05/11/12 0507 05/12/12 0447 05/13/12 0442 05/14/12 0728  NA 141 138 141 144 141  K 3.4* 3.9 3.5 3.6 4.0  CL 102 102 108 110 110  CO2 29 23 20  17* 19  GLUCOSE 214* 140* 93 71 141*  BUN 9 16 20 21 15   CREATININE 0.99 1.08 1.13* 1.08 0.88  CALCIUM 9.1 8.8 8.0* 8.5 8.6   Liver Function Tests:  Recent Labs Lab 05/10/12 1422 05/11/12 0507  AST 14 15  ALT <5 <5  ALKPHOS 55 58  BILITOT 1.0 1.1  PROT 6.7 6.0  ALBUMIN 3.1* 2.8*    Recent Labs Lab 05/10/12 1422  LIPASE  45   No results found for this basename: AMMONIA,  in the last 168 hours CBC:  Recent Labs Lab 05/10/12 1052 05/11/12 0507 05/12/12 0447 05/12/12 2021 05/13/12 0442 05/14/12 0728  WBC 7.9 6.0 4.2  --  4.5 5.6  NEUTROABS 6.5  --   --   --   --   --   HGB 10.3* 9.7* 8.6* 8.8* 8.7* 10.3*  HCT 32.6* 30.2* 26.9* 27.6* 27.3* 32.0*  MCV 84.9 85.3 85.1  --  85.0 84.0  PLT 244 265 215  --  231 225   Cardiac Enzymes: No results found for this basename: CKTOTAL, CKMB, CKMBINDEX, TROPONINI,  in the last 168 hours BNP (last 3 results)  Recent Labs  08/18/11 1658  PROBNP 7537.0*   CBG:  Recent Labs Lab 05/13/12 0801 05/13/12 1141 05/13/12 1657 05/13/12 2110 05/14/12 0757  GLUCAP 73 70 156* 135* 141*    Recent Results (from the past 240 hour(s))  URINE CULTURE     Status: None   Collection Time    05/10/12 11:30 AM      Result Value Range Status   Specimen Description URINE, CATHETERIZED   Final   Special Requests NONE   Final   Culture  Setup Time 05/11/2012 01:10   Final   Colony Count >=100,000 COLONIES/ML   Final   Culture ESCHERICHIA COLI   Final   Report Status 05/12/2012 FINAL   Final   Organism ID, Bacteria ESCHERICHIA COLI   Final     Studies: No results found.  Scheduled Meds: . antiseptic oral rinse  15 mL Mouth Rinse BID  . cefTRIAXone (ROCEPHIN)  IV  1 g Intravenous Q24H  . cloNIDine  0.2 mg Transdermal Weekly  . insulin aspart  0-15 Units Subcutaneous TID WC  . latanoprost  1 drop Both Eyes QHS  . metoprolol  2.5 mg Intravenous Q6H  . polyethylene glycol  17 g Oral Daily   Continuous Infusions: . sodium chloride 50 mL/hr at 05/13/12 1241    Principal Problem:   Abdominal pain Active Problems:   Coronary artery disease   UTI (urinary tract infection)   Generalized weakness   Anemia   Hypokalemia   Diabetes mellitus   CHF, chronic   S/P placement of cardiac pacemaker   Constipation   Colonic mass    Time spent: 25 minutes.      [facility]  [facility] Pager [phone]. If 7PM-7AM, please contact night-coverage at [email or web address], password [facility] 05/14/2012, 10:19 AM  LOS: 4 days

## 2012-05-14 NOTE — Progress Notes (Signed)
Patient B/P 174/60,hr 60,Dr Memon notified.Patient's son says that he is unclear when he placed the clonidine patch on patient before she was admitted,upon looking through the notes it was placed on 05/10/2012, that was based on  information given from the son.Today he is asking if we can go ahead and change out the patch, it is due to be changed every 7 days,spoke with Dr Kerry Hough, he gave the ok to go ahead a change out the clonidine patch.Will continue to monitor patient.

## 2012-05-15 DIAGNOSIS — I369 Nonrheumatic tricuspid valve disorder, unspecified: Secondary | ICD-10-CM

## 2012-05-15 LAB — CBC
MCH: 26.9 pg (ref 26.0–34.0)
MCHC: 31.9 g/dL (ref 30.0–36.0)
MCV: 84.2 fL (ref 78.0–100.0)
Platelets: 250 10*3/uL (ref 150–400)
RDW: 16.9 % — ABNORMAL HIGH (ref 11.5–15.5)

## 2012-05-15 LAB — BASIC METABOLIC PANEL
CO2: 25 mEq/L (ref 19–32)
Calcium: 9 mg/dL (ref 8.4–10.5)
Creatinine, Ser: 0.91 mg/dL (ref 0.50–1.10)
GFR calc Af Amer: 64 mL/min — ABNORMAL LOW (ref >90)
GFR calc non Af Amer: 55 mL/min — ABNORMAL LOW (ref >90)

## 2012-05-15 MED ORDER — POTASSIUM CHLORIDE CRYS ER 20 MEQ PO TBCR
40.0000 meq | EXTENDED_RELEASE_TABLET | ORAL | Status: AC
  Administered 2012-05-15 (×2): 40 meq via ORAL
  Filled 2012-05-15 (×2): qty 2

## 2012-05-15 MED ORDER — HYDROCODONE-ACETAMINOPHEN 5-500 MG PO TABS: 1.0000 | ORAL_TABLET | Freq: Three times a day (TID) | ORAL | Status: DC | PRN

## 2012-05-15 NOTE — Progress Notes (Signed)
*  PRELIMINARY RESULTS* Echocardiogram 2D Echocardiogram has been performed.  Conrad [place] 05/15/2012, 11:07 AM

## 2012-05-15 NOTE — Progress Notes (Signed)
Discharged home with instructions given on medications,and follow up visits,both patient and son verbalized understanding.Prescriptions sent with patient.No c/co pain or discomfort noted.Accompanied by staff to an awaiting vehicle.

## 2012-05-15 NOTE — Discharge Summary (Signed)
[facility]                                                                                   Cindy Abbott, is a 79 y.o. female  DOB 01-09-1922  MRN [id number].  Admission date:  05/10/2012  Discharge Date:  05/15/2012  Primary MD  No primary provider on file.  Admitting Physician  Erick Blinks, MD  Admission Diagnosis  Urinary tract infection [599.0] Partial bowel obstruction [560.9]  Discharge Diagnosis     Principal Problem:   Abdominal pain Active Problems:   Coronary artery disease   UTI (urinary tract infection)   Generalized weakness   Anemia   Hypokalemia   Diabetes mellitus   CHF, chronic   S/P placement of cardiac pacemaker   Constipation   Colonic mass   Past Medical History  Diagnosis Date  . Coronary artery disease   . Diabetes mellitus   . Pacemaker   . Asthma   . Stroke, acute, thrombotic 08/19/2011    Unknown when in the past but she has received TPA for a CVA  . Anemia   . Colon polyps     Followed by GI at [facility] h/o numerous polyps 2008 with large one that could not be removed endoscopically. patient opted against surgical resection. yearly colonoscopies until 2011. previously on coumadin but was held due to slow GI bleed related to large polyp  . A-fib     previously on coumadin but withheld due to chronic GI bleed    Past Surgical History  Procedure Laterality Date  . Cardiac surgery    . Coronary angioplasty with stent placement    . Coronary artery bypass graft    . Abdominal hysterectomy       Recommendations for primary care physician for things to follow:      Discharge Diagnoses:   Principal Problem:   Abdominal pain Active Problems:   Coronary artery disease   UTI (urinary tract infection)   Generalized weakness   Anemia   Hypokalemia   Diabetes mellitus   CHF, chronic   S/P placement of cardiac pacemaker   Constipation   Colonic mass    Discharge Condition: stable, poor longterm prognosis   Diet  recommendation: See Discharge Instructions below   Consults Surgery    History of present illness and  Hospital Course:     Kindly see H&P for history of present illness and admission details, please review complete Labs, Consult reports and Test reports for all details in brief Cindy Abbott, is a 79 y.o. female, patient with history of CAD, pace maker, DM-2, was admitted to the hospital with chief complaint of abdominal pain, her CT of the abdomen was suggestive of an apple core lesion with partial colonic obstruction, this most likely at her age would be suggestive of malignancy, general surgery was consulted who recommended surgical intervention however the patient and family at this time frame from surgical intervention and wanted to watch him medically. Patient currently is on liquid diet having bowel movements and is symptom-free, I discussed this in detail with Dr. Lovell Sheehan who recommends home discharge, however the standard of care would be surgical intervention, this  was discussed with her son and daughter again by me in detail he had this time again wants to withhold any significant dimension he completely denies that patient might develop complete bowel obstruction and might die from the same. He at this time once patient as well as outpatient with her new primary care physician Dr. Vear Clock and Jonnie Kind and general surgeon. He also requests some pain medications for home for symptomatic relief.    Patient has history of diabetes mellitus type 2 her A1c was 6.4, she will be taking diabetic diet with a home regimen of insulin, she is chronic diastolic CHF-the last echo from 08/2011 shows EF of 65-70%, echogram done this admission today is still pending, she does not have any acute complaints or symptoms related to her heart, I will discharge a [facility] Speicher kindly obtain and echo report which should be up in the system tomorrow morning.   She history of atrial fibrillation, she's  not on Coumadin due to due history of GI bleeding. Globally get it controlled she will continue on home dose Lopressor along with aspirin.   History of CAD status post pacemaker, symptom-free from cardiac standpoint. Continue home dose Lopressor.     She had UTI which has been adequately treated for 5 days with Cipro.   Today   Subjective:   [name] today has no headache,no chest abdominal pain,no new weakness tingling or numbness, feels much better wants to go home today.   Objective:   Blood pressure 181/65, pulse 60, temperature 97.8 F (36.6 C), temperature source Oral, resp. rate 20, height 5\' 2"  (1.575 m), weight 57.3 kg (126 lb 5.2 oz), SpO2 100.00%.   Intake/Output Summary (Last 24 hours) at 05/15/12 1124 Last data filed at 05/15/12 0249  Gross per 24 hour  Intake 1350.83 ml  Output    400 ml  Net 950.83 ml    Exam Awake Alert, Oriented *3, No new F.N deficits, Normal affect [place].AT,PERRAL Supple Neck,No JVD, No cervical lymphadenopathy appriciated.  Symmetrical Chest wall movement, Good air movement bilaterally, CTAB RRR,No Gallops,Rubs or new Murmurs, No Parasternal Heave +ve B.Sounds, Abd Soft, Non tender, No organomegaly appriciated, No rebound -guarding or rigidity. No Cyanosis, Clubbing or edema, No new Rash or bruise  Data Review   Major procedures and Radiology Reports - PLEASE review detailed and final reports for all details in brief -      Ct Abdomen Pelvis W Contrast  05/10/2012  *RADIOLOGY REPORT*  Clinical Data: Constipation, abdominal pain, altered mental status  CT ABDOMEN AND PELVIS WITH CONTRAST  Technique:  Multidetector CT imaging of the abdomen and pelvis was performed following the standard protocol during bolus administration of intravenous contrast.  Contrast: OMNIPAQUE IOHEXOL 300 MG/ML  SOLN  Comparison: None.  Findings:  Enteric contrast extends to the level of a markedly distended cecum.  The cecum, ascending and transverse colon  appear dilated to the level of the splenic flexure where there is an apparent apple core type lesion, measuring approximately 3.2 x 2.9 cm (Coronal image 34; axial image 28, series 2).  This finding is associated with relative decompression of the downstream colon. There is a minimal amount of stranding about the cecum and ascending colon. No pneumoperitoneum, pneumatosis or portal venous gas.  No discrete/definable fluid collection.  Normal appearance of the appendix.  Normal contour.  There is an approximately 1.3 x 1.6 cm hypoattenuating lesion with the dome of the left lobe of the liver (image 14, series  2) which cannot be characterized as a hepatic cyst.  There is mild heterogeneity of the hepatic parenchyma which may be secondary to an element of venous congestion secondary to reflux of contrast into the hepatic venous system.  Normal gallbladder.  No intra or extrahepatic biliary duct dilatation. There is a small amount of fluid adjacent to the tip of the right lobe of the liver.  There is symmetric enhancement and excretion of the bilateral kidneys.  Scattered bilateral sub centimeter hypoattenuating lesions are too small to accurate characterize are favored to represent a renal cyst.  No definite renal stones on the post contrast examination.  Note is made of extrarenal pelvises bilaterally.  No urinary obstruction or perinephric stranding. There is mild thickening of the crux of the left adrenal gland without discrete nodule.  Normal appearance of the right adrenal gland and spleen.  There is mild prominence of the pancreatic duct measuring approximately 6 mm in diameter at the level of the pancreatic head (images 25 and 27) however this is not associated with a discrete pancreatic mass or pancreatic atrophy.  No peripancreatic stranding.  Rather extensive atherosclerotic plaque within a normal caliber abdominal aorta.  There is extensive atherosclerotic plaque involving the origin and proximal aspects of  the main branch vessels of the abdominal aorta.  The IMA appears patent on this non CT examination.  Incidental note is made of a retroaortic left sided renal vein.  No retroperitoneal, mesenteric, pelvic or inguinal lymphadenopathy.  Limited visualization of the lower thorax is negative for focal airspace opacity or pleural effusion.  Cardiomegaly, particular, there is enlargement of the right side of the heart.  Extensive calcifications within the mitral valve annulus.  A ventricular pacer lead terminates within the right ventricular apex. No pericardial effusion.  No acute or aggressive osseous abnormalities.  IMPRESSION:  1.  Findings worrisome for a near obstructive "apple core" type lesion within the splenic flexure of the colon with associated marked dilatation of the cecum, ascending and transverse colon. There is a minimal amount of stranding about the cecum and ascending colon without evidence of perforation or definable/drainable fluid collection.  2.  Approximately 1.6 cm hypoattenuating lesion within the dome of the left lobe of the liver.  In the setting of a possible colonic mass, this lesion is indeterminate and further evaluation with MRI in the non-emergent setting may be obtained as clinically indicated.  3.  Cardiomegaly with findings suggestive of possible mild right- sided heart failure.  Further evaluation with cardiac echo may be obtained if clinically indicated.  4.  Extensive atherosclerotic plaque within the normal caliber abdominal aorta.  5.  Nonspecific prominence of the pancreatic duct without associated pancreatic atrophy. No discrete pancreatic mass or peripancreatic stranding.  Further evaluation with a non emergent MRCP may be obtained as clinically indicated.   Original Report Authenticated By: Tacey Ruiz, MD    Dg Abd Acute W/chest  05/10/2012  *RADIOLOGY REPORT*  Clinical Data: Abdominal pain, altered mental status, constipation  ACUTE ABDOMEN SERIES (ABDOMEN 2 VIEW & CHEST  1 VIEW)  Comparison: Chest radiograph - 01/11/2012  Findings:  Grossly unchanged cardiac silhouette and mediastinal contours post median sternotomy and CABG.  There are extensive calcifications within the mitral valve annulus.  Stable positioning of support apparatus.  Mild cephalization of flow without frank evidence of edema.  No focal airspace opacity.  No definite pleural effusion or pneumothorax.  Moderate colonic stool burden (in particular within the rectal vault) without evidence of  obstruction.  Vascular calcifications overlie the left upper abdomen.  No definite pneumoperitoneum, pneumatosis or portal venous gas.  Multilevel thoracolumbar spine degenerative change.  IMPRESSION: 1.  No acute cardiopulmonary disease. 2.  Moderate colonic stool burden without evidence of obstruction.   Original Report Authenticated By: Tacey Ruiz, MD     Micro Results     Recent Results (from the past 240 hour(s))  URINE CULTURE     Status: None   Collection Time    05/10/12 11:30 AM      Result Value Range Status   Specimen Description URINE, CATHETERIZED   Final   Special Requests NONE   Final   Culture  Setup Time 05/11/2012 01:10   Final   Colony Count >=100,000 COLONIES/ML   Final   Culture ESCHERICHIA COLI   Final   Report Status 05/12/2012 FINAL   Final   Organism ID, Bacteria ESCHERICHIA COLI   Final     CBC w Diff: Lab Results  Component Value Date   WBC 6.5 05/15/2012   HGB 9.2* 05/15/2012   HCT 28.8* 05/15/2012   PLT 250 05/15/2012   LYMPHOPCT 10* 05/10/2012   MONOPCT 7 05/10/2012   EOSPCT 1 05/10/2012   BASOPCT 0 05/10/2012    CMP: Lab Results  Component Value Date   NA 143 05/15/2012   K 3.2* 05/15/2012   CL 109 05/15/2012   CO2 25 05/15/2012   BUN 9 05/15/2012   CREATININE 0.91 05/15/2012   PROT 6.0 05/11/2012   ALBUMIN 2.8* 05/11/2012   BILITOT 1.1 05/11/2012   ALKPHOS 58 05/11/2012   AST 15 05/11/2012   ALT <5 05/11/2012  .   Discharge Instructions     Follow with Primary MD  in 3 days    Get CBC, CMP, checked 3 days by Primary MD and again as instructed by your Primary MD. Get a 2 view Chest X ray done next visit if you had Pneumonia of Lung problems at the Hospital.  Get Medicines reviewed and adjusted.  Please request your Prim.MD to go over all Hospital Tests and Procedure/Radiological results at the follow up, please get all Hospital records sent to your Prim MD by signing hospital release before you go home.  Activity: As tolerated with Full fall precautions use walker/cane & assistance as needed   Diet:  Full liquid Low carb diet advance as tolerated  For Heart failure patients - Check your Weight same time everyday, if you gain over 2 pounds, or you develop in leg swelling, experience more shortness of breath or chest pain, call your Primary MD immediately. Follow Cardiac Low Salt Diet and 1.8 lit/day fluid restriction.  Disposition Home   If you experience worsening of your admission symptoms, develop shortness of breath, life threatening emergency, suicidal or homicidal thoughts you must seek medical attention immediately by calling 911 or calling your MD immediately  if symptoms less severe.  You Must read complete instructions/literature along with all the possible adverse reactions/side effects for all the Medicines you take and that have been prescribed to you. Take any new Medicines after you have completely understood and accpet all the possible adverse reactions/side effects.   Do not drive and provide baby sitting services if your were admitted for syncope or siezures until you have seen by Primary MD or a Neurologist and advised to do so again.  Do not drive when taking Pain medications.    Do not take more than prescribed Pain, Sleep and Anxiety Medications  Special Instructions: If you have smoked or chewed Tobacco  in the last 2 yrs please stop smoking, stop any regular Alcohol  and or any Recreational drug use.  Wear Seat belts while  driving.  Follow-up Information   Schedule an appointment as soon as possible for a visit in 3 days to follow up.      Follow up with Dalia Heading, MD. Schedule an appointment as soon as possible for a visit in 4 days.   Contact information:   [address] [address] [phone]         Discharge Medications     Medication List    TAKE these medications       aspirin 325 MG tablet  Take 325 mg by mouth daily.     cloNIDine 0.2 mg/24hr patch  Commonly known as:  CATAPRES - Dosed in mg/24 hr  Place 1 patch (0.2 mg total) onto the skin once a week.     docusate sodium 100 MG capsule  Commonly known as:  COLACE  Take 100 mg by mouth 2 (two) times daily.     HYDROcodone-acetaminophen 5-500 MG per tablet  Commonly known as:  LORTAB 5  Take 1 tablet by mouth every 8 (eight) hours as needed for pain.     insulin glargine 100 UNIT/ML injection  Commonly known as:  LANTUS  Inject 15-18 Units into the skin at bedtime.     insulin lispro 100 UNIT/ML injection  Commonly known as:  HUMALOG  Inject 2-6 Units into the skin 3 (three) times daily before meals. Per sliding scale     isosorbide mononitrate 30 MG 24 hr tablet  Commonly known as:  IMDUR  Take 30 mg by mouth daily.     latanoprost 0.005 % ophthalmic solution  Commonly known as:  XALATAN  Place 1 drop into both eyes at bedtime.     metoprolol 50 MG tablet  Commonly known as:  LOPRESSOR  Take 50 mg by mouth 2 (two) times daily.           Total Time in preparing paper work, data evaluation and todays exam - 35 minutes  Leroy Sea M.D on 05/15/2012 at 11:24 AM  [facility] Office  [phone]

## 2012-05-16 LAB — GLUCOSE, CAPILLARY
Glucose-Capillary: 176 mg/dL — ABNORMAL HIGH (ref 70–99)
Glucose-Capillary: 184 mg/dL — ABNORMAL HIGH (ref 70–99)

## 2012-08-10 ENCOUNTER — Emergency Department (HOSPITAL_COMMUNITY): Payer: Medicare Other

## 2012-08-10 ENCOUNTER — Inpatient Hospital Stay (HOSPITAL_COMMUNITY): Payer: Medicare Other

## 2012-08-10 ENCOUNTER — Encounter (HOSPITAL_COMMUNITY): Payer: Self-pay

## 2012-08-10 ENCOUNTER — Inpatient Hospital Stay (HOSPITAL_COMMUNITY)
Admission: EM | Admit: 2012-08-10 | Discharge: 2012-08-12 | DRG: 392 | Disposition: A | Discharge status: Hospice | Payer: Medicare Other | Attending: Family Medicine | Admitting: Family Medicine

## 2012-08-10 DIAGNOSIS — Z950 Presence of cardiac pacemaker: Secondary | ICD-10-CM

## 2012-08-10 DIAGNOSIS — E876 Hypokalemia: Secondary | ICD-10-CM | POA: Diagnosis not present

## 2012-08-10 DIAGNOSIS — D638 Anemia in other chronic diseases classified elsewhere: Secondary | ICD-10-CM | POA: Diagnosis present

## 2012-08-10 DIAGNOSIS — F0390 Unspecified dementia without behavioral disturbance: Secondary | ICD-10-CM | POA: Diagnosis present

## 2012-08-10 DIAGNOSIS — E119 Type 2 diabetes mellitus without complications: Secondary | ICD-10-CM | POA: Diagnosis present

## 2012-08-10 DIAGNOSIS — Z7982 Long term (current) use of aspirin: Secondary | ICD-10-CM

## 2012-08-10 DIAGNOSIS — I509 Heart failure, unspecified: Secondary | ICD-10-CM

## 2012-08-10 DIAGNOSIS — I4891 Unspecified atrial fibrillation: Secondary | ICD-10-CM | POA: Diagnosis present

## 2012-08-10 DIAGNOSIS — R197 Diarrhea, unspecified: Secondary | ICD-10-CM | POA: Diagnosis present

## 2012-08-10 DIAGNOSIS — R29898 Other symptoms and signs involving the musculoskeletal system: Secondary | ICD-10-CM | POA: Diagnosis present

## 2012-08-10 DIAGNOSIS — K5900 Constipation, unspecified: Secondary | ICD-10-CM | POA: Diagnosis present

## 2012-08-10 DIAGNOSIS — I10 Essential (primary) hypertension: Secondary | ICD-10-CM | POA: Diagnosis present

## 2012-08-10 DIAGNOSIS — Z66 Do not resuscitate: Secondary | ICD-10-CM | POA: Diagnosis not present

## 2012-08-10 DIAGNOSIS — I5032 Chronic diastolic (congestive) heart failure: Secondary | ICD-10-CM | POA: Diagnosis present

## 2012-08-10 DIAGNOSIS — I2510 Atherosclerotic heart disease of native coronary artery without angina pectoris: Secondary | ICD-10-CM

## 2012-08-10 DIAGNOSIS — K529 Noninfective gastroenteritis and colitis, unspecified: Secondary | ICD-10-CM

## 2012-08-10 DIAGNOSIS — Z794 Long term (current) use of insulin: Secondary | ICD-10-CM

## 2012-08-10 DIAGNOSIS — C184 Malignant neoplasm of transverse colon: Secondary | ICD-10-CM | POA: Diagnosis present

## 2012-08-10 DIAGNOSIS — C189 Malignant neoplasm of colon, unspecified: Secondary | ICD-10-CM

## 2012-08-10 DIAGNOSIS — D649 Anemia, unspecified: Secondary | ICD-10-CM | POA: Diagnosis present

## 2012-08-10 DIAGNOSIS — I69998 Other sequelae following unspecified cerebrovascular disease: Secondary | ICD-10-CM

## 2012-08-10 DIAGNOSIS — A09 Infectious gastroenteritis and colitis, unspecified: Principal | ICD-10-CM | POA: Diagnosis present

## 2012-08-10 DIAGNOSIS — C787 Secondary malignant neoplasm of liver and intrahepatic bile duct: Secondary | ICD-10-CM | POA: Diagnosis present

## 2012-08-10 DIAGNOSIS — R109 Unspecified abdominal pain: Secondary | ICD-10-CM | POA: Diagnosis present

## 2012-08-10 DIAGNOSIS — Z951 Presence of aortocoronary bypass graft: Secondary | ICD-10-CM

## 2012-08-10 DIAGNOSIS — J45909 Unspecified asthma, uncomplicated: Secondary | ICD-10-CM | POA: Diagnosis present

## 2012-08-10 DIAGNOSIS — Z23 Encounter for immunization: Secondary | ICD-10-CM

## 2012-08-10 DIAGNOSIS — Z9861 Coronary angioplasty status: Secondary | ICD-10-CM

## 2012-08-10 HISTORY — DX: Malignant (primary) neoplasm, unspecified: C80.1

## 2012-08-10 LAB — CBC WITH DIFFERENTIAL/PLATELET
Basophils Absolute: 0 10*3/uL (ref 0.0–0.1)
HCT: 30 % — ABNORMAL LOW (ref 36.0–46.0)
Lymphocytes Relative: 15 % (ref 12–46)
Monocytes Absolute: 0.4 10*3/uL (ref 0.1–1.0)
Neutro Abs: 4.6 10*3/uL (ref 1.7–7.7)
Platelets: 287 10*3/uL (ref 150–400)
RBC: 3.61 MIL/uL — ABNORMAL LOW (ref 3.87–5.11)
RDW: 16.3 % — ABNORMAL HIGH (ref 11.5–15.5)
WBC: 5.9 10*3/uL (ref 4.0–10.5)

## 2012-08-10 LAB — COMPREHENSIVE METABOLIC PANEL
ALT: <5 U/L (ref 0–35)
AST: 13 U/L (ref 0–37)
CO2: 24 mEq/L (ref 19–32)
Chloride: 104 mEq/L (ref 96–112)
GFR calc non Af Amer: 52 mL/min — ABNORMAL LOW (ref >90)
Sodium: 137 mEq/L (ref 135–145)
Total Bilirubin: 1.5 mg/dL — ABNORMAL HIGH (ref 0.3–1.2)

## 2012-08-10 LAB — GLUCOSE, CAPILLARY: Glucose-Capillary: 146 mg/dL — ABNORMAL HIGH (ref 70–99)

## 2012-08-10 LAB — URINALYSIS, ROUTINE W REFLEX MICROSCOPIC
Bilirubin Urine: NEGATIVE
Protein, ur: NEGATIVE mg/dL
Urobilinogen, UA: 0.2 mg/dL (ref 0.0–1.0)

## 2012-08-10 LAB — URINE MICROSCOPIC-ADD ON

## 2012-08-10 MED ORDER — HYDROMORPHONE HCL PF 1 MG/ML IJ SOLN: 0.5000 mg | INTRAMUSCULAR | Status: DC | PRN

## 2012-08-10 MED ORDER — ONDANSETRON HCL 4 MG/2ML IJ SOLN: 4.0000 mg | Freq: Four times a day (QID) | INTRAMUSCULAR | Status: DC | PRN

## 2012-08-10 MED ORDER — INSULIN ASPART 100 UNIT/ML ~~LOC~~ SOLN
0.0000 [IU] | Freq: Four times a day (QID) | SUBCUTANEOUS | Status: DC
Start: 2012-08-10 — End: 2012-08-12
  Administered 2012-08-11: 1 [IU] via SUBCUTANEOUS
  Administered 2012-08-12: 3 [IU] via SUBCUTANEOUS

## 2012-08-10 MED ORDER — ALBUTEROL SULFATE (5 MG/ML) 0.5% IN NEBU
2.5000 mg | INHALATION_SOLUTION | Freq: Four times a day (QID) | RESPIRATORY_TRACT | Status: DC | PRN
  Administered 2012-08-10: 2.5 mg via RESPIRATORY_TRACT
  Filled 2012-08-10: qty 0.5

## 2012-08-10 MED ORDER — SODIUM CHLORIDE 0.9 % IV BOLUS (SEPSIS)
500.0000 mL | Freq: Once | INTRAVENOUS | Status: AC
  Administered 2012-08-10: 500 mL via INTRAVENOUS

## 2012-08-10 MED ORDER — SODIUM CHLORIDE 0.9 % IV SOLN
INTRAVENOUS | Status: DC
  Administered 2012-08-10 – 2012-08-11 (×2): via INTRAVENOUS

## 2012-08-10 MED ORDER — INSULIN ASPART 100 UNIT/ML ~~LOC~~ SOLN: 0.0000 [IU] | Freq: Three times a day (TID) | SUBCUTANEOUS | Status: DC

## 2012-08-10 MED ORDER — CLONIDINE HCL 0.2 MG/24HR TD PTWK
0.2000 mg | MEDICATED_PATCH | TRANSDERMAL | Status: DC
  Administered 2012-08-10: 0.2 mg via TRANSDERMAL
  Filled 2012-08-10: qty 1
  Filled 2012-08-10: qty 2

## 2012-08-10 MED ORDER — IOHEXOL 300 MG/ML  SOLN
50.0000 mL | Freq: Once | INTRAMUSCULAR | Status: AC | PRN
  Administered 2012-08-10: 50 mL via ORAL

## 2012-08-10 MED ORDER — METOPROLOL TARTRATE 50 MG PO TABS
50.0000 mg | ORAL_TABLET | Freq: Two times a day (BID) | ORAL | Status: DC
  Administered 2012-08-10 – 2012-08-12 (×4): 50 mg via ORAL
  Filled 2012-08-10 (×4): qty 1

## 2012-08-10 MED ORDER — ACETAMINOPHEN 325 MG PO TABS: 650.0000 mg | ORAL_TABLET | Freq: Four times a day (QID) | ORAL | Status: DC | PRN

## 2012-08-10 MED ORDER — ACETAMINOPHEN 650 MG RE SUPP: 650.0000 mg | Freq: Four times a day (QID) | RECTAL | Status: DC | PRN

## 2012-08-10 MED ORDER — BIOTENE DRY MOUTH MT LIQD
15.0000 mL | Freq: Two times a day (BID) | OROMUCOSAL | Status: DC
  Administered 2012-08-11 – 2012-08-12 (×3): 15 mL via OROMUCOSAL

## 2012-08-10 MED ORDER — IOHEXOL 300 MG/ML  SOLN
100.0000 mL | Freq: Once | INTRAMUSCULAR | Status: AC | PRN
  Administered 2012-08-10: 100 mL via INTRAVENOUS

## 2012-08-10 MED ORDER — ONDANSETRON HCL 4 MG PO TABS: 4.0000 mg | ORAL_TABLET | Freq: Four times a day (QID) | ORAL | Status: DC | PRN

## 2012-08-10 MED ORDER — HYDROMORPHONE HCL PF 1 MG/ML IJ SOLN
0.5000 mg | INTRAMUSCULAR | Status: DC | PRN
  Administered 2012-08-10 – 2012-08-11 (×3): 0.5 mg via INTRAVENOUS
  Filled 2012-08-10 (×3): qty 1

## 2012-08-10 MED ORDER — LATANOPROST 0.005 % OP SOLN
1.0000 [drp] | Freq: Every day | OPHTHALMIC | Status: DC
  Administered 2012-08-10 – 2012-08-11 (×2): 1 [drp] via OPHTHALMIC
  Filled 2012-08-10: qty 2.5

## 2012-08-10 NOTE — ED Provider Notes (Signed)
CSN: [id number]     Arrival date & time 08/10/12  0102 History  This chart was scribed for Geoffery Lyons, MD by Bennett Scrape, ED Scribe. This patient was seen in room APA10/APA10 and the patient's care was started at 9:45 AM.   Chief Complaint  Patient presents with  . Weakness  . Abdominal Pain    The history is provided by the patient. No language interpreter was used.    HPI Comments: Cindy Abbott is a 79 y.o. female brought in by ambulance, who presents to the Emergency Department complaining of RLQ abdominal pain present for the past 2 days that worsened last night. Pt denies abdominal pain currently. Last BM was in the ED and ED techs described it as a large amount of watery stool. Son states that the pt had a colonoscopy that showed 10 polyps. 9 were removed and the 10th "apple core lesion" was left due to size. Son states that the 10th was being monitored for CA. Pt was diagnosed with possible colon cancer, no biopsy done yet, and a partial blockage by CT scan in April or May 2014 that is not currently being treated with chemotherapy or radiation. Son states that the pt has not been complaining of pain until the past couple of days. She has been following up with Dr. Lovell Sheehan for surgery, but family declined due to age and Dr. Lovell Sheehan is currently observing the pt closely. She is taking colace and Murelax for constipation and Lortab PRN with improvement. Son states that the Murelax "overdoes it" sometimes and causes diarrhea, but he denies giving the pt any Murelax. He denies fevers, nausea or emesis as associated symptoms. Pt has an extensive cardiac history including 5 cardiac stents, CABG and a pacemaker. Son denies any complaints of CP.    Past Medical History  Diagnosis Date  . Coronary artery disease   . Diabetes mellitus   . Pacemaker   . Asthma   . Stroke, acute, thrombotic 08/19/2011    Unknown when in the past but she has received TPA for a CVA  . Anemia   . Colon polyps      Followed by GI at [facility]. h/o numerous polyps 2008 with large one that could not be removed endoscopically. patient opted against surgical resection. yearly colonoscopies until 2011. previously on coumadin but was held due to slow GI bleed related to large polyp  . A-fib     previously on coumadin but withheld due to chronic GI bleed  . Cancer   dementia per son  Past Surgical History  Procedure Laterality Date  . Cardiac surgery    . Coronary angioplasty with stent placement    . Coronary artery bypass graft    . Abdominal hysterectomy     Family History  Problem Relation Age of Onset  . Colon cancer Neg Hx    History  Substance Use Topics  . Smoking status: Never Smoker   . Smokeless tobacco: Not on file  . Alcohol Use: No   No OB history provided.  Review of Systems  Gastrointestinal: Positive for abdominal pain.  All other systems reviewed and are negative.    Allergies  Lisinopril  Home Medications   Current Outpatient Rx  Name  Route  Sig  Dispense  Refill  . aspirin 325 MG tablet   Oral   Take 325 mg by mouth daily.         . cloNIDine (CATAPRES - DOSED IN MG/24 HR) 0.2 mg/24hr patch  Transdermal   Place 1 patch (0.2 mg total) onto the skin once a week.   4 patch   1   . docusate sodium (COLACE) 100 MG capsule   Oral   Take 100 mg by mouth 2 (two) times daily.         Marland Kitchen HYDROcodone-acetaminophen (LORTAB 5) 5-500 MG per tablet   Oral   Take 1 tablet by mouth every 8 (eight) hours as needed for pain.   20 tablet   0   . insulin glargine (LANTUS) 100 UNIT/ML injection   Subcutaneous   Inject 15-18 Units into the skin at bedtime.         . insulin lispro (HUMALOG) 100 UNIT/ML injection   Subcutaneous   Inject 2-6 Units into the skin 3 (three) times daily before meals. Per sliding scale         . isosorbide mononitrate (IMDUR) 30 MG 24 hr tablet   Oral   Take 30 mg by mouth daily.         Marland Kitchen latanoprost (XALATAN) 0.005 % ophthalmic  solution   Both Eyes   Place 1 drop into both eyes at bedtime.   2.5 mL   1   . metoprolol (LOPRESSOR) 50 MG tablet   Oral   Take 50 mg by mouth 2 (two) times daily.          Triage Vitals: BP 190/53  Pulse 61  Temp(Src) 98.7 F (37.1 C) (Rectal)  Resp 16  SpO2 98%  Physical Exam  Nursing note and vitals reviewed. Constitutional: She is oriented to person, place, and time. No distress.  Frail, elderly woman  HENT:  Head: Normocephalic and atraumatic.  Eyes: Conjunctivae and EOM are normal.  Neck: Normal range of motion. Neck supple. No tracheal deviation present.  Cardiovascular: Normal rate, regular rhythm and normal heart sounds.   No murmur heard. Pulmonary/Chest: Effort normal and breath sounds normal. No respiratory distress. She has no wheezes. She has no rales.  Abdominal: Soft. Bowel sounds are normal. There is tenderness. There is no rebound and no guarding.  Tenderness to palpation in the LLQ and RLQ. No rebound or guarding   Musculoskeletal: Normal range of motion. She exhibits no edema.  Neurological: She is alert and oriented to person, place, and time. No cranial nerve deficit.  Skin: Skin is warm and dry.  Psychiatric: She has a normal mood and affect. Her behavior is normal.    ED Course   Procedures (including critical care time)  Medications  sodium chloride 0.9 % bolus 500 mL (0 mLs Intravenous Stopped 08/10/12 1113)    DIAGNOSTIC STUDIES: Oxygen Saturation is 98% on room air, normal by my interpretation.    COORDINATION OF CARE: 10:01 AM-Discussed treatment plan which includes CXR, CBC panel, CMP and UA with pt and son at bedside and both agreed to plan.  12:32 PM-Consult complete with Dr. Lovell Sheehan, general surgery. Patient case explained and discussed. Dr. Lovell Sheehan recommends admission to internal medicine. Call ended at 12:34 PM 12:38 PM-Informed pt of lab and radiology results showing lesion that causes liquid BMs. Discussed consult with Dr.  Lovell Sheehan with recommendation of surgery with pt and son. Son is hesitant to say yes to surgery and is requesting to speak with Dr. Lovell Sheehan; however, Son is agreeable to admission. 12:47 PM-Consult complete with Dr. Irene Limbo, hospitalist. Patient case explained and discussed. Dr. Irene Limbo agrees to admit patient for further evaluation and treatment to internal medicine. Call ended at 12:48 PM.  Labs Reviewed  CBC WITH DIFFERENTIAL - Abnormal; Notable for the following:    RBC 3.61 (*)    Hemoglobin 9.3 (*)    HCT 30.0 (*)    MCH 25.8 (*)    RDW 16.3 (*)    Neutrophils Relative % 79 (*)    All other components within normal limits  COMPREHENSIVE METABOLIC PANEL - Abnormal; Notable for the following:    Glucose, Bld 171 (*)    Albumin 2.8 (*)    Total Bilirubin 1.5 (*)    GFR calc non Af Amer 52 (*)    GFR calc Af Amer 60 (*)    All other components within normal limits  URINALYSIS, ROUTINE W REFLEX MICROSCOPIC - Abnormal; Notable for the following:    Hgb urine dipstick MODERATE (*)    Ketones, ur TRACE (*)    Leukocytes, UA TRACE (*)    All other components within normal limits  URINE MICROSCOPIC-ADD ON - Abnormal; Notable for the following:    Bacteria, UA MANY (*)    All other components within normal limits   Dg Abd Acute W/chest  08/10/2012   *RADIOLOGY REPORT*  Clinical Data: Abdominal pain with blood in stool.  ACUTE ABDOMEN SERIES (ABDOMEN 2 VIEW & CHEST 1 VIEW)  Comparison: Acute abdominal series and CT 05/10/2012.  Findings: There is stable cardiomegaly status post CABG.  Aortic and mitral annular calcifications are noted.  Left subclavian pacemaker lead appears unchanged.  The lungs are clear.  There is borderline small bowel distension.  The colon appears slightly less distended than on the prior study, although there is a possible persistent filling defect in the region of the splenic flexure corresponding with the apple core lesion seen on prior CT. Diffuse aorto iliac  atherosclerosis is noted.  There is no free intraperitoneal air.  IMPRESSION:  1.  Borderline small bowel distension.  Prior CT demonstrated a finding near the splenic flexure of the colon worrisome for colon cancer.  Consider follow-up CT. 2.  No evidence of perforation. 3.  No active cardiopulmonary process.   Original Report Authenticated By: Carey Bullocks, M.D.   No diagnosis found.  MDM  This patient presents today with complaints of abdominal cramping and bloating. She has a history of known colon cancer at the splenic flexure. This is a nearly obstructing apple core lesion which Dr. Lovell Sheehan feels as though surgery is indicated. The family was reluctant to do this several months ago when she was in the hospital. It appears as though her obstruction is worsening and she is having more difficulty passing formed stool. She had several watery bowel movements today in the ER. Workup today reveals no elevation of white count and electrolytes are otherwise unremarkable. An acute abdominal series  was performed which showed borderline small bowel distention. Due to the worsening of her symptoms it appears as though admission is warranted. I spoke with Dr. Lovell Sheehan who would like the patient to be admitted to internal medicine. He will see the patient and discuss the options again with the family. I've spoken with Dr. Irene Limbo from [facility] as well who agrees to admit the patient to his service.  I personally performed the services described in this documentation, which was scribed in my presence. The recorded information has been reviewed and is accurate.      Geoffery Lyons, MD 08/10/12 1459

## 2012-08-10 NOTE — H&P (Signed)
Patient seen, independently examined and chart reviewed. I agree with exam, assessment and plan discussed with Toya Smothers, NP.  Very pleasant 79 year old woman with history of abnormal CT of the abdomen and pelvis with an apple core lesion suggesting partial colonic obstruction, worrisome for malignancy given her history of an unresectable colonic polyp. She was hospitalized 05/2012 for this and seen by general surgery and surgery was recommended, however given clinical improvement the family declined surgery and the patient went home. Overall she has done fairly well since that time and pain has been managed with narcotics and bowel regimen. Just a few days ago she was eating fine without difficulty. However last night she developed left lower quadrant abdominal pain and diarrhea. So voluminous that she went through an entire pack of diapers. No bleeding. This morning her pain was so severe that she could not hide it from her son and so she was brought to the emergency department where she was noted to be afebrile with stable vital signs. Screening laboratory studies were unremarkable. Acute abdominal series suggested small bowel distention without evidence of perforation or definite obstruction.  Currently she feels okay still has some left lower quadrant pain. She has had multiple bowel movements since admission. She appears calm and comfortable. Abdomen soft with positive bowel sounds. She does have left lower quadrant pain on exam. She is nontoxic in appearance.  Assessment/plan Worrisome for complication of known colonic lesion. Currently nontoxic without any signs or symptoms to suggest abdominal catastrophe. Discussed with Dr. Granville Lewis will proceed with a CT scan of the abdomen and pelvis, further surgical recommendations will follow afterwards.  She has a history of stroke treated with TPA 2013 with residual left-sided weakness as well as CABG and multiple stents. CABG was approximately 14 years  ago. Stents several years ago. She has had no recent chest pain. Her 2-D echocardiogram earlier this year was notable for borderline systolic function with a left ventricular ejection fraction of 45-50%. Wall motion abnormalities were seen that time.  Certainly her surgical risk is fairly high given her advanced age and comorbidities but she has had no recent chest pain or signs and symptoms of cardiac disease. Therefore continue beta blocker and proceed with surgery if indicated.  [name], MD [facility] [phone]

## 2012-08-10 NOTE — H&P (Signed)
[facility] History and Physical  Cindy Abbott ZOX:[id number] DOB: 1922-08-18 DOA: 08/10/2012  Referring physician:  PCP: No PCP Per Patient  Specialists:   Chief Complaint: Abdominal pain  HPI: Cindy Abbott is a very pleasant 79 y.o. female with a past medical history that includes CAD status post multiple stents and pacemaker, diabetes, stroke, chronic diastolic heart failure, colonic polyps, A. fib not on Coumadin do to history of GI bleed presents to the emergency room with a chief complaint of abdominal pain. Information is obtained from the patient and the chart. Patient indicates she developed right lower Cindy Abbott and pain 2 days ago. She states that she has been dealing with intermittent abdominal pain for a couple months but has been more persistent over the last 2 days and worsened last night. She has had several loose stools while in the emergency room. Son reports that the patient had a colonoscopy that showed 10 polyps. 9 were removed and the 10th was left do to size. At home patient manages her bowels with me selects and Colace that she has a tendency for constipation. Report of bright red blood parenchyma or dark stools. Last hospitalization in May surgery was refused to do to patient's age and weakened state. She was discharged to home and has been under the care of Dr. Lovell Sheehan. Family states that she has done fairly well since that time. Patient denies any nausea or vomiting. She denies any fever chills chest pain palpitation. She denies shortness of breath or cough. She denies dysuria hematuria frequency or urgency. Lab work in the emergency room significant for glucose of 171 hemoglobin 9.3, analysis with many bacteria trace leukocytes trace ketones. Vital signs significant for a blood pressure of 190/53. Abdominal x-ray yields borderline small bowel distension. Prior CT demonstrated a finding near the splenic flexure of the colon worrisome for colon cancer. Consider follow-up CT. No evidence  of perforation. No active cardiopulmonary process. Hospitalists are asked to admit   Review of Systems: The patient denies anorexia, fever, weight loss,, vision loss, decreased hearing, hoarseness, chest pain, syncope, dyspnea on exertion, peripheral edema, balance deficits, hemoptysis,  melena, hematochezia, severe indigestion/heartburn, hematuria, incontinence, genital sores, muscle weakness, suspicious skin lesions, transient blindness, difficulty walking, depression, unusual weight change, abnormal bleeding, enlarged lymph nodes, angioedema, and breast masses.    Past Medical History  Diagnosis Date  . Coronary artery disease   . Diabetes mellitus   . Pacemaker   . Asthma   . Stroke, acute, thrombotic 08/19/2011    Unknown when in the past but she has received TPA for a CVA  . Anemia   . Colon polyps     Followed by GI at [facility]. h/o numerous polyps 2008 with large one that could not be removed endoscopically. patient opted against surgical resection. yearly colonoscopies until 2011. previously on coumadin but was held due to slow GI bleed related to large polyp  . A-fib     previously on coumadin but withheld due to chronic GI bleed  . Cancer    Past Surgical History  Procedure Laterality Date  . Cardiac surgery    . Coronary angioplasty with stent placement    . Coronary artery bypass graft    . Abdominal hysterectomy     Social History:  reports that she has never smoked. She does not have any smokeless tobacco history on file. She reports that she does not drink alcohol or use illicit drugs. She lives with her family who assists her with ADLs.  Allergies  Allergen Reactions  . Lisinopril Anaphylaxis and Swelling    Family History  Problem Relation Age of Onset  . Colon cancer Neg Hx    in my history reviewed and noncontributory to the admission of this elderly lady  Prior to Admission medications   Medication Sig Start Date End Date Taking? Authorizing Provider   albuterol (PROVENTIL) (2.5 MG/3ML) 0.083% nebulizer solution Take 2.5 mg by nebulization every 6 (six) hours as needed for wheezing.   Yes Historical Provider, MD  aspirin 325 MG tablet Take 325 mg by mouth daily.   Yes Historical Provider, MD  cloNIDine (CATAPRES - DOSED IN MG/24 HR) 0.2 mg/24hr patch Place 1 patch onto the skin once a week. Friday 08/23/11 08/22/12 Yes Micki Riley, MD  docusate sodium (COLACE) 100 MG capsule Take 100 mg by mouth daily as needed for constipation.   Yes Historical Provider, MD  HYDROcodone-acetaminophen (LORTAB 5) 5-500 MG per tablet Take 1 tablet by mouth every 8 (eight) hours as needed for pain. 05/15/12  Yes Leroy Sea, MD  insulin glargine (LANTUS) 100 UNIT/ML injection Inject 15-18 Units into the skin at bedtime. 09/14/11 09/13/12 Yes Daniel J Angiulli, PA-C  insulin lispro (HUMALOG) 100 UNIT/ML injection Inject 2-6 Units into the skin 3 (three) times daily before meals. Per sliding scale   Yes Historical Provider, MD  isosorbide mononitrate (IMDUR) 30 MG 24 hr tablet Take 30 mg by mouth daily.   Yes Historical Provider, MD  latanoprost (XALATAN) 0.005 % ophthalmic solution Place 1 drop into both eyes at bedtime. 09/14/11 09/13/12 Yes Daniel J Angiulli, PA-C  metoprolol (LOPRESSOR) 50 MG tablet Take 50 mg by mouth 2 (two) times daily.   Yes Historical Provider, MD   Physical Exam: Filed Vitals:   08/10/12 1211  BP: 190/53  Pulse: 61  Temp: 98.7 F (37.1 C)  Resp: 16     General:  Thin frail teary-eyed no acute distress  Eyes: PE RRL, EOMI, scleral icterus  ENT: Ears clear nose without drainage oropharynx without erythema or exudate. Mucous membranes of her mouth are pink somewhat dry  Neck: Supple no JVD  Cardiovascular: Regular rate and rhythm + murmur no gallop no rub no lower extremity  Respiratory: Mild increased work of breathing with conversation. Sounds clear bilaterally to auscultation no wheezing no rhonchi  Abdomen: Soft sluggish  bowel sounds mild to moderate tenderness particularly in lower quadrants no guarding  Skin: Warm and dry no rash no lesion  Musculoskeletal: No clubbing no cyanosis  Psychiatric: Somewhat anxious crying cooperative Neurologic: Speech clear facial symmetry alert and oriented x3 Labs on Admission:  Basic Metabolic Panel:  Recent Labs Lab 08/10/12 1002  NA 137  K 4.5  CL 104  CO2 24  GLUCOSE 171*  BUN 15  CREATININE 0.96  CALCIUM 9.2   Liver Function Tests:  Recent Labs Lab 08/10/12 1002  AST 13  ALT <5  ALKPHOS 54  BILITOT 1.5*  PROT 6.5  ALBUMIN 2.8*   No results found for this basename: LIPASE, AMYLASE,  in the last 168 hours No results found for this basename: AMMONIA,  in the last 168 hours CBC:  Recent Labs Lab 08/10/12 1002  WBC 5.9  NEUTROABS 4.6  HGB 9.3*  HCT 30.0*  MCV 83.1  PLT 287   Cardiac Enzymes: No results found for this basename: CKTOTAL, CKMB, CKMBINDEX, TROPONINI,  in the last 168 hours  BNP (last 3 results)  Recent Labs  08/18/11 1658  PROBNP  7537.0*   CBG: No results found for this basename: GLUCAP,  in the last 168 hours  Radiological Exams on Admission: Dg Abd Acute W/chest  08/10/2012   *RADIOLOGY REPORT*  Clinical Data: Abdominal pain with blood in stool.  ACUTE ABDOMEN SERIES (ABDOMEN 2 VIEW & CHEST 1 VIEW)  Comparison: Acute abdominal series and CT 05/10/2012.  Findings: There is stable cardiomegaly status post CABG.  Aortic and mitral annular calcifications are noted.  Left subclavian pacemaker lead appears unchanged.  The lungs are clear.  There is borderline small bowel distension.  The colon appears slightly less distended than on the prior study, although there is a possible persistent filling defect in the region of the splenic flexure corresponding with the apple core lesion seen on prior CT. Diffuse aorto iliac atherosclerosis is noted.  There is no free intraperitoneal air.  IMPRESSION:  1.  Borderline small bowel  distension.  Prior CT demonstrated a finding near the splenic flexure of the colon worrisome for colon cancer.  Consider follow-up CT. 2.  No evidence of perforation. 3.  No active cardiopulmonary process.   Original Report Authenticated By: Carey Bullocks, M.D.    EKG:    Assessment/Plan Principal Problem:   Abdominal pain: In patient with known apple core lesion. Will admit to medical floor. Abdominal series yields the colon appears slightly less distended than on the prior study, although there is a possible persistent filling defect in the region of the splenic flexure corresponding with the apple core lesion seen on prior CT.no indications of perforation, no free air Will keep patient n.p.o. Dr. Lovell Sheehan with general surgery has been contacted for consultation. Will provide pain medicine as needed.  Active Problems: Diarrhea:frequent loose stools while in the emergency department. Likely related to #1.will defer to Gen. Surgery for management. Will monitor electrolytes closely.  HTN: Poor control. Continue clonidine patch. Hold beta blocker for now monitor closely provide when necessary    Coronary artery disease:no chest pain.will hold aspirin and Imdur    Asthma: appear stable at baseline.Provide oxygen supplementation as needed monitor oxygen saturation level    Anemia:likely chronic disease. Hemoglobin currently at baseline.    Diabetes mellitus:Will check hemoglobin A1c. Will hold home Lantus. Patient is n.p.o. Will use sliding scale for optimal glycemic control    CHF, chronic:[place] diastolic. Last echo yiel with moderate LVH and moderate mitral valve regurgds eject Will monitor intake and output will get daily weights. Patient does not appear to be in failure at this time   Dr. Lovell Sheehan with general surgery contacted by the emergency department provider       Code Status: Presumed to be full. Needs to be addressed with patient and family  Family Communication none present:   Disposition Plan:  Home when ready.  Time spent: 65 minutes  Gwenyth Bender [facility] Pager [phone]  If 7PM-7AM, please contact night-coverage [email or web address] Password [email or web address] 08/10/2012, 1:32 PM

## 2012-08-10 NOTE — Progress Notes (Signed)
ED/CM noted pt has no PCP listed.  Son states cardiologist  'pretty much takes care of her". He states he has not been able to find a doctor who is taking new Medicare  Patients. [facility] handouts given. Son very approciative

## 2012-08-10 NOTE — ED Notes (Signed)
EMS reports pt woke up in the middle of the night with abd pain.  Denies any n/v/d.  LBM was this am.  Pt has history of colon cancer.  Denies any chemo or radiation.  EMS also reports son told them she has been confused this morning.  PT unable to remember her birthday.

## 2012-08-10 NOTE — Progress Notes (Signed)
UR Chart Review Completed  

## 2012-08-11 DIAGNOSIS — C189 Malignant neoplasm of colon, unspecified: Secondary | ICD-10-CM

## 2012-08-11 DIAGNOSIS — R109 Unspecified abdominal pain: Secondary | ICD-10-CM

## 2012-08-11 DIAGNOSIS — E119 Type 2 diabetes mellitus without complications: Secondary | ICD-10-CM

## 2012-08-11 DIAGNOSIS — R197 Diarrhea, unspecified: Secondary | ICD-10-CM

## 2012-08-11 LAB — COMPREHENSIVE METABOLIC PANEL
ALT: <5 U/L (ref 0–35)
AST: 11 U/L (ref 0–37)
Albumin: 2.4 g/dL — ABNORMAL LOW (ref 3.5–5.2)
Alkaline Phosphatase: 49 U/L (ref 39–117)
Calcium: 8.7 mg/dL (ref 8.4–10.5)
GFR calc Af Amer: 71 mL/min — ABNORMAL LOW (ref >90)
Glucose, Bld: 117 mg/dL — ABNORMAL HIGH (ref 70–99)
Potassium: 3.4 mEq/L — ABNORMAL LOW (ref 3.5–5.1)
Sodium: 135 mEq/L (ref 135–145)
Total Protein: 5.5 g/dL — ABNORMAL LOW (ref 6.0–8.3)

## 2012-08-11 LAB — CBC
HCT: 27.2 % — ABNORMAL LOW (ref 36.0–46.0)
Hemoglobin: 8.7 g/dL — ABNORMAL LOW (ref 12.0–15.0)
MCHC: 32 g/dL (ref 30.0–36.0)
MCV: 82.9 fL (ref 78.0–100.0)
RDW: 16.4 % — ABNORMAL HIGH (ref 11.5–15.5)

## 2012-08-11 LAB — GLUCOSE, CAPILLARY
Glucose-Capillary: 103 mg/dL — ABNORMAL HIGH (ref 70–99)
Glucose-Capillary: 120 mg/dL — ABNORMAL HIGH (ref 70–99)

## 2012-08-11 LAB — HEMOGLOBIN A1C
Hgb A1c MFr Bld: 6.5 % — ABNORMAL HIGH (ref <5.7)
Mean Plasma Glucose: 140 mg/dL — ABNORMAL HIGH (ref <117)

## 2012-08-11 MED ORDER — HYDROCODONE-ACETAMINOPHEN 5-325 MG PO TABS: 1.0000 | ORAL_TABLET | Freq: Four times a day (QID) | ORAL | Status: DC | PRN

## 2012-08-11 MED ORDER — METRONIDAZOLE 500 MG PO TABS: 500.0000 mg | ORAL_TABLET | Freq: Three times a day (TID) | ORAL | Status: DC

## 2012-08-11 MED ORDER — CIPROFLOXACIN HCL 500 MG PO TABS: 500.0000 mg | ORAL_TABLET | Freq: Two times a day (BID) | ORAL | Status: DC

## 2012-08-11 MED ORDER — CIPROFLOXACIN IN D5W 400 MG/200ML IV SOLN
400.0000 mg | Freq: Two times a day (BID) | INTRAVENOUS | Status: DC
  Administered 2012-08-11 – 2012-08-12 (×3): 400 mg via INTRAVENOUS
  Filled 2012-08-11 (×7): qty 200

## 2012-08-11 MED ORDER — ISOSORBIDE MONONITRATE ER 60 MG PO TB24
30.0000 mg | ORAL_TABLET | Freq: Every day | ORAL | Status: DC
  Administered 2012-08-11 – 2012-08-12 (×2): 30 mg via ORAL
  Filled 2012-08-11 (×2): qty 1

## 2012-08-11 MED ORDER — METRONIDAZOLE IN NACL 5-0.79 MG/ML-% IV SOLN
500.0000 mg | Freq: Three times a day (TID) | INTRAVENOUS | Status: DC
  Administered 2012-08-11 – 2012-08-12 (×4): 500 mg via INTRAVENOUS
  Filled 2012-08-11 (×10): qty 100

## 2012-08-11 MED ORDER — POTASSIUM CHLORIDE CRYS ER 20 MEQ PO TBCR
40.0000 meq | EXTENDED_RELEASE_TABLET | Freq: Once | ORAL | Status: AC
  Administered 2012-08-11: 40 meq via ORAL
  Filled 2012-08-11: qty 2

## 2012-08-11 NOTE — Consult Note (Signed)
Reason for Consult: Possible colonic obstruction Referring Physician: [facility]  Cindy Abbott is an 79 y.o. female.  HPI: Patient is an 79 year old black female well known to me from a previous admission for a colon mass seen in the distal transverse colon. Original he consult on her in May of 2014. At that time, there was a question as to whether she would tolerate a transverse loop colostomy for diversion. She has multiple medical problems it was not felt be a good candidate for a bigger procedure such as a partial colectomy. An extensive discussion with the patient and his son, who decided to avoid surgery at that time. I've seen him in my office since then and she was tolerating a soft diet well and was having no issues at home. She presented back to [facility] do to increasing abdominal pain and diarrhea. A CT scan of the abdomen and pelvis was performed which revealed new multiple liver masses, consistent with metastatic disease from the colon. The apple-core lesion is still present in the distal colon, but there is contrast past this point. She is not currently having any nausea or vomiting. She does have mild abdominal pain on the left side.  Past Medical History  Diagnosis Date  . Coronary artery disease   . Diabetes mellitus   . Pacemaker   . Asthma   . Stroke, acute, thrombotic 08/19/2011    Unknown when in the past but she has received TPA for a CVA  . Anemia   . Colon polyps     Followed by GI at [facility]. h/o numerous polyps 2008 with large one that could not be removed endoscopically. patient opted against surgical resection. yearly colonoscopies until 2011. previously on coumadin but was held due to slow GI bleed related to large polyp  . A-fib     previously on coumadin but withheld due to chronic GI bleed  . Cancer 2014    colon    Past Surgical History  Procedure Laterality Date  . Cardiac surgery    . Coronary angioplasty with stent placement    . Coronary  artery bypass graft    . Abdominal hysterectomy      Family History  Problem Relation Age of Onset  . Colon cancer Neg Hx     Social History:  reports that she has never smoked. She does not have any smokeless tobacco history on file. She reports that she does not drink alcohol or use illicit drugs.  Allergies:  Allergies  Allergen Reactions  . Lisinopril Anaphylaxis and Swelling    Medications: I have reviewed the patient's current medications.  Results for orders placed during the hospital encounter of 08/10/12 (from the past 48 hour(s))  CBC WITH DIFFERENTIAL     Status: Abnormal   Collection Time    08/10/12 10:02 AM      Result Value Range   WBC 5.9  4.0 - 10.5 K/uL   RBC 3.61 (*) 3.87 - 5.11 MIL/uL   Hemoglobin 9.3 (*) 12.0 - 15.0 g/dL   HCT 16.1 (*) 09.6 - 04.5 %   MCV 83.1  78.0 - 100.0 fL   MCH 25.8 (*) 26.0 - 34.0 pg   MCHC 31.0  30.0 - 36.0 g/dL   RDW 40.9 (*) 81.1 - 91.4 %   Platelets 287  150 - 400 K/uL   Neutrophils Relative % 79 (*) 43 - 77 %   Neutro Abs 4.6  1.7 - 7.7 K/uL   Lymphocytes Relative  15  12 - 46 %   Lymphs Abs 0.9  0.7 - 4.0 K/uL   Monocytes Relative 6  3 - 12 %   Monocytes Absolute 0.4  0.1 - 1.0 K/uL   Eosinophils Relative 1  0 - 5 %   Eosinophils Absolute 0.0  0.0 - 0.7 K/uL   Basophils Relative 0  0 - 1 %   Basophils Absolute 0.0  0.0 - 0.1 K/uL  COMPREHENSIVE METABOLIC PANEL     Status: Abnormal   Collection Time    08/10/12 10:02 AM      Result Value Range   Sodium 137  135 - 145 mEq/L   Potassium 4.5  3.5 - 5.1 mEq/L   Chloride 104  96 - 112 mEq/L   CO2 24  19 - 32 mEq/L   Glucose, Bld 171 (*) 70 - 99 mg/dL   BUN 15  6 - 23 mg/dL   Creatinine, Ser 1.61  0.50 - 1.10 mg/dL   Calcium 9.2  8.4 - 09.6 mg/dL   Total Protein 6.5  6.0 - 8.3 g/dL   Albumin 2.8 (*) 3.5 - 5.2 g/dL   AST 13  0 - 37 U/L   ALT <5  0 - 35 U/L   Alkaline Phosphatase 54  39 - 117 U/L   Total Bilirubin 1.5 (*) 0.3 - 1.2 mg/dL   GFR calc non Af Amer 52 (*)  >90 mL/min   GFR calc Af Amer 60 (*) >90 mL/min   Comment:            The eGFR has been calculated     using the CKD EPI equation.     This calculation has not been     validated in all clinical     situations.     eGFR's persistently     <90 mL/min signify     possible Chronic Kidney Disease.  URINALYSIS, ROUTINE W REFLEX MICROSCOPIC     Status: Abnormal   Collection Time    08/10/12 10:15 AM      Result Value Range   Color, Urine YELLOW  YELLOW   APPearance CLEAR  CLEAR   Specific Gravity, Urine 1.025  1.005 - 1.030   pH 5.5  5.0 - 8.0   Glucose, UA NEGATIVE  NEGATIVE mg/dL   Hgb urine dipstick MODERATE (*) NEGATIVE   Bilirubin Urine NEGATIVE  NEGATIVE   Ketones, ur TRACE (*) NEGATIVE mg/dL   Protein, ur NEGATIVE  NEGATIVE mg/dL   Urobilinogen, UA 0.2  0.0 - 1.0 mg/dL   Nitrite NEGATIVE  NEGATIVE   Leukocytes, UA TRACE (*) NEGATIVE  URINE MICROSCOPIC-ADD ON     Status: Abnormal   Collection Time    08/10/12 10:15 AM      Result Value Range   WBC, UA 0-2  <3 WBC/hpf   RBC / HPF 7-10  <3 RBC/hpf   Bacteria, UA MANY (*) RARE  HEMOGLOBIN A1C     Status: Abnormal   Collection Time    08/10/12  1:00 PM      Result Value Range   Hemoglobin A1C 6.5 (*) <5.7 %   Comment: (NOTE)  According to the ADA Clinical Practice Recommendations for 2011, when     HbA1c is used as a screening test:      >=6.5%   Diagnostic of Diabetes Mellitus               (if abnormal result is confirmed)     5.7-6.4%   Increased risk of developing Diabetes Mellitus     References:Diagnosis and Classification of Diabetes Mellitus,Diabetes     Care,2011,34(Suppl 1):S62-S69 and Standards of Medical Care in             Diabetes - 2011,Diabetes Care,2011,34 (Suppl 1):S11-S61.   Mean Plasma Glucose 140 (*) <117 mg/dL   Comment: Performed at [facility]  GLUCOSE, CAPILLARY     Status: Abnormal   Collection Time     08/10/12  2:26 PM      Result Value Range   Glucose-Capillary 146 (*) 70 - 99 mg/dL  GLUCOSE, CAPILLARY     Status: Abnormal   Collection Time    08/10/12  5:06 PM      Result Value Range   Glucose-Capillary 159 (*) 70 - 99 mg/dL   Comment 1 Notify RN     Comment 2 Documented in Chart    GLUCOSE, CAPILLARY     Status: Abnormal   Collection Time    08/10/12  8:55 PM      Result Value Range   Glucose-Capillary 146 (*) 70 - 99 mg/dL   Comment 1 Notify RN     Comment 2 Documented in Chart    GLUCOSE, CAPILLARY     Status: Abnormal   Collection Time    08/11/12  4:54 AM      Result Value Range   Glucose-Capillary 103 (*) 70 - 99 mg/dL  COMPREHENSIVE METABOLIC PANEL     Status: Abnormal   Collection Time    08/11/12  5:14 AM      Result Value Range   Sodium 135  135 - 145 mEq/L   Potassium 3.4 (*) 3.5 - 5.1 mEq/L   Comment: DELTA CHECK NOTED   Chloride 105  96 - 112 mEq/L   CO2 22  19 - 32 mEq/L   Glucose, Bld 117 (*) 70 - 99 mg/dL   BUN 12  6 - 23 mg/dL   Creatinine, Ser 3.47  0.50 - 1.10 mg/dL   Calcium 8.7  8.4 - 42.5 mg/dL   Total Protein 5.5 (*) 6.0 - 8.3 g/dL   Albumin 2.4 (*) 3.5 - 5.2 g/dL   AST 11  0 - 37 U/L   ALT <5  0 - 35 U/L   Alkaline Phosphatase 49  39 - 117 U/L   Total Bilirubin 1.1  0.3 - 1.2 mg/dL   GFR calc non Af Amer 62 (*) >90 mL/min   GFR calc Af Amer 71 (*) >90 mL/min   Comment:            The eGFR has been calculated     using the CKD EPI equation.     This calculation has not been     validated in all clinical     situations.     eGFR's persistently     <90 mL/min signify     possible Chronic Kidney Disease.  CBC     Status: Abnormal   Collection Time    08/11/12  5:14 AM      Result Value Range   WBC 4.1  4.0 - 10.5 K/uL  RBC 3.28 (*) 3.87 - 5.11 MIL/uL   Hemoglobin 8.7 (*) 12.0 - 15.0 g/dL   HCT 40.9 (*) 81.1 - 91.4 %   MCV 82.9  78.0 - 100.0 fL   MCH 26.5  26.0 - 34.0 pg   MCHC 32.0  30.0 - 36.0 g/dL   RDW 78.2 (*) 95.6 - 21.3 %    Platelets 256  150 - 400 K/uL  GLUCOSE, CAPILLARY     Status: Abnormal   Collection Time    08/11/12 11:27 AM      Result Value Range   Glucose-Capillary 120 (*) 70 - 99 mg/dL   Comment 1 Notify RN     Comment 2 Documented in Chart      Ct Abdomen W Contrast  08/10/2012   *RADIOLOGY REPORT*  Clinical Data: Right lower quadrant abdominal pain.  Diarrhea.  CT ABDOMEN WITH CONTRAST  Technique:  Multidetector CT imaging of the abdomen was performed following the standard protocol during bolus administration of intravenous contrast.  Contrast: 50mL OMNIPAQUE IOHEXOL 300 MG/ML  SOLN, OMNIPAQUE IOHEXOL 300 MG/ML  SOLN  Comparison: CT of the abdomen and pelvis 05/10/2012.  Findings:  Lung Bases: Marked cardiomegaly.  Severe calcifications of the mitral annulus, mitral valve and subvalvular apparatus, suggesting prior rheumatic fever.  A pacemaker lead terminating in the right ventricular apex.  Atherosclerotic calcifications in the distal left anterior descending and right coronary arteries.  Massive right atrial dilatation.  Abdomen/Pelvis:  There is an irregular shaped heterogeneously enhancing centrally low attenuation lesion in the superior aspect of segment eight of the liver (image 9 of series 2) highly suspicious for a malignant neoplasm, likely a metastatic lesion. The main bulk of this lesion measures up to 3.8 x 2.9 cm, but the entire lesion is larger than this and simply difficult to discretely measure because of its irregular contours. Additionally, in the anterior aspect of segment three of the liver (image 21 of series 2) there is a 4.5 x 2.9 cm heterogeneously enhancing lesion that likely represents an additional metastasis. The enhancement characteristics of the remainder of the liver are somewhat irregular such that multiple additional smaller lesions are also suspected.  Previously noted well-defined low attenuation lesion in segment 2 of the liver is similar to the prior study measuring  1.7 x 1.5 cm, favored to represent a cyst.  A small volume of perihepatic ascites is new compared to the prior study.  The appearance of the gallbladder, spleen and bilateral adrenal glands is unremarkable.  Mild diffuse ductal dilatation of the main pancreatic duct is similar to the prior examination.  Multifocal cortical thinning throughout the kidneys bilaterally, likely indicative of post infectious or inflammatory scarring.  Multiple subcentimeter low attenuation lesions in the kidneys bilaterally are too small to definitively characterize but are similar to the prior study and favored to represent small cysts.  As with the prior examination there is massive distension of the ascending colon and transverse colon which also appears to have diffuse wall thickening.  This abruptly transitions to normal caliber distal to a focal area of narrowing in the distal transverse colon best demonstrated on image 31 of series 2, which is highly suspicious for a primary colonic neoplasm (alternatively, this may represent a post ischemic stricture).  Extensive atherosclerosis throughout the abdominal and pelvic vasculature, without definite aneurysm.  Probable high-grade stenosis at the origin of the celiac axis and throughout the proximal superior mesenteric arteries are noted.  There is also prominent atherosclerotic plaque in  the proximal inferior mesenteric artery as well.  Small volume of ascites adjacent to the spleen, and small volume of ascites in the lower anatomic pelvis. Status post hysterectomy.  Ovaries are not confidently identified may be surgically absent or atrophic.  Musculoskeletal: There are no aggressive appearing lytic or blastic lesions noted in the visualized portions of the skeleton.  IMPRESSION: 1.  Interval development of at least two highly suspicious appearing hepatic masses, with numerous other suspected smaller lesions, highly concerning for hepatic metastases.  Given the focal narrowing of the  distal transverse colon, these are favored to be metastases from a primary colonic neoplasm. 2.  Small volume of ascites. 3.  The focal narrowing of the distal transverse colon is associated with proximal colonic dilatation and wall thickening similar to the prior study, which may suggest a colitis in these regions. 4.  Extensive atherosclerosis, including multivessel coronary artery disease and multivessel disease throughout the visceral vasculature, with areas of potentially hemodynamically significant stenosis in the proximal celiac axis and superior mesenteric arteries. 5.  Additional incidental findings, similar to prior studies, as above.   Original Report Authenticated By: Trudie Reed, M.D.   Dg Abd Acute W/chest  08/10/2012   *RADIOLOGY REPORT*  Clinical Data: Abdominal pain with blood in stool.  ACUTE ABDOMEN SERIES (ABDOMEN 2 VIEW & CHEST 1 VIEW)  Comparison: Acute abdominal series and CT 05/10/2012.  Findings: There is stable cardiomegaly status post CABG.  Aortic and mitral annular calcifications are noted.  Left subclavian pacemaker lead appears unchanged.  The lungs are clear.  There is borderline small bowel distension.  The colon appears slightly less distended than on the prior study, although there is a possible persistent filling defect in the region of the splenic flexure corresponding with the apple core lesion seen on prior CT. Diffuse aorto iliac atherosclerosis is noted.  There is no free intraperitoneal air.  IMPRESSION:  1.  Borderline small bowel distension.  Prior CT demonstrated a finding near the splenic flexure of the colon worrisome for colon cancer.  Consider follow-up CT. 2.  No evidence of perforation. 3.  No active cardiopulmonary process.   Original Report Authenticated By: Carey Bullocks, M.D.    ROS: See chart Blood pressure 169/58, pulse 66, temperature 97.8 F (36.6 C), temperature source Oral, resp. rate 20, height 5\' 5"  (1.651 m), weight 55.43 kg (122 lb 3.2 oz),  SpO2 100.00%. Physical Exam: Pleasant black female in no acute distress Abdomen: Soft with no significant abdominal distention noted. Bowel sounds are present. No rigidity is noted.  Assessment/Plan: Impression: Findings consistent with metastatic colon cancer to the liver with in the narrowing of the colon at the distal transverse region. No tissue diagnosis has been made. CEA level is pending. I along with Dr. Irene Limbo had an extensive discussion with the patient's son who is her power of attorney. We told him that most likely she has advancing metastatic colon cancer for which she could not be cared of. She is a poor surgical candidate and may not even tolerate general anesthesia. We discussed DO NOT RESUSCITATE status with them, including no CPR or surgical intervention. We also discussed the possibility of having hospice involvement when she is discharged. The son was very amenable to these suggestions. He does not 1 surgical intervention at this time, which I agree with. Will continue to follow with you.  Nicoletta Hush A 08/11/2012, 2:44 PM

## 2012-08-11 NOTE — Progress Notes (Signed)
[facility] PROGRESS NOTE  Cindy Abbott:[id number] DOB: [birth year] DOA: 08/10/2012 PCP: No PCP Per Patient  Assessment/Plan: Abdominal pain: In patient with known apple core lesion. Improved today.  CT of abdomen yields interval development of at least two highly suspicious  appearing hepatic masses, with numerous other suspected smaller  lesions, highly concerning for hepatic metastases. Given the focal  narrowing of the distal transverse colon, these are favored to be metastases from a primary colonic neoplasm. Small volume of ascites. The focal narrowing of the distal transverse colon is associated with proximal colonic dilatation and wall thickening similar to the prior study, which may suggest a colitis in these  regions. Will obtain stool for cdiff and GI pathogen. Await CEA. Start cipro and flagyl. Not a surgical candidate. Family discussed with Dr. Lovell Sheehan and DNR being considered. Case management discussed Hospice care and family currently agrees to meet with Hospice.   Active Problems:  Diarrhea: continues with frequent loose stools. See #1.Will monitor electrolytes closely.  Hypokalemia: mild. Likely related to #2. Will replete and recheck.  HTN: Fair control. Continue clonidine patch and beta blocker.  Coronary artery disease:no chest pain.will hold aspirin and resume imdur. Asthma: appear stable at baseline.Provide oxygen supplementation as needed monitor oxygen saturation level  Anemia:likely chronic disease. Tending down somewhat. No s/sx bleeding. May be related to dehydration from large amounts diarrhea. Will monitor  Diabetes mellitus:Hemoglobin A1c 6.5. Will hold home Lantus. Little po intake. Will use sliding scale for optimal glycemic control  CHF, chronic:Likely diastolic. Last echo  with moderate LVH and moderate mitral valve regurg. Wt 122 lbs down from 126lbs. Patient does not appear to be in failure at this time   Code Status: full Family Communication:   Disposition Plan: home likely with Hospice care   Consultants:  General surgery  Procedures:  none  Antibiotics:  Flagyl 08/11/12>>  cipro 08/11/12>>  HPI/Subjective: Eyes closed easily aroused. Denies pain  Objective: Filed Vitals:   08/11/12 0907  BP: 169/58  Pulse: 66  Temp:   Resp:     Intake/Output Summary (Last 24 hours) at 08/11/12 1051 Last data filed at 08/11/12 0526  Gross per 24 hour  Intake      0 ml  Output   1600 ml  Net  -1600 ml   Filed Weights   08/10/12 1519 08/10/12 1600 08/11/12 0442  Weight: 126 lb (57.153 kg) 126 lb 5.2 oz (57.3 kg) 122 lb 3.2 oz (55.43 kg)    Exam:   General:  Thin frail  Cardiovascular: RRR +m no gr, No LE edema  Respiratory: normal effort BS clear bilaterally no wheeze no rhonchi  Abdomen: soft +BS mild tenderness to palpation no guarding  Musculoskeletal: no clubbing no cyanosis   Data Reviewed: Basic Metabolic Panel:  Recent Labs Lab 08/10/12 1002 08/11/12 0514  NA 137 135  K 4.5 3.4*  CL 104 105  CO2 24 22  GLUCOSE 171* 117*  BUN 15 12  CREATININE 0.96 0.83  CALCIUM 9.2 8.7   Liver Function Tests:  Recent Labs Lab 08/10/12 1002 08/11/12 0514  AST 13 11  ALT <5 <5  ALKPHOS 54 49  BILITOT 1.5* 1.1  PROT 6.5 5.5*  ALBUMIN 2.8* 2.4*   No results found for this basename: LIPASE, AMYLASE,  in the last 168 hours No results found for this basename: AMMONIA,  in the last 168 hours CBC:  Recent Labs Lab 08/10/12 1002 08/11/12 0514  WBC 5.9 4.1  NEUTROABS 4.6  --  HGB 9.3* 8.7*  HCT 30.0* 27.2*  MCV 83.1 82.9  PLT 287 256   Cardiac Enzymes: No results found for this basename: CKTOTAL, CKMB, CKMBINDEX, TROPONINI,  in the last 168 hours BNP (last 3 results)  Recent Labs  08/18/11 1658  PROBNP 7537.0*   CBG:  Recent Labs Lab 08/10/12 1426 08/10/12 1706 08/10/12 2055 08/11/12 0454  GLUCAP 146* 159* 146* 103*    No results found for this or any previous visit (from the  past 240 hour(s)).   Studies: Ct Abdomen W Contrast  08/10/2012   *RADIOLOGY REPORT*  Clinical Data: Right lower quadrant abdominal pain.  Diarrhea.  CT ABDOMEN WITH CONTRAST  Technique:  Multidetector CT imaging of the abdomen was performed following the standard protocol during bolus administration of intravenous contrast.  Contrast: 50mL OMNIPAQUE IOHEXOL 300 MG/ML  SOLN, OMNIPAQUE IOHEXOL 300 MG/ML  SOLN  Comparison: CT of the abdomen and pelvis 05/10/2012.  Findings:  Lung Bases: Marked cardiomegaly.  Severe calcifications of the mitral annulus, mitral valve and subvalvular apparatus, suggesting prior rheumatic fever.  A pacemaker lead terminating in the right ventricular apex.  Atherosclerotic calcifications in the distal left anterior descending and right coronary arteries.  Massive right atrial dilatation.  Abdomen/Pelvis:  There is an irregular shaped heterogeneously enhancing centrally low attenuation lesion in the superior aspect of segment eight of the liver (image 9 of series 2) highly suspicious for a malignant neoplasm, likely a metastatic lesion. The main bulk of this lesion measures up to 3.8 x 2.9 cm, but the entire lesion is larger than this and simply difficult to discretely measure because of its irregular contours. Additionally, in the anterior aspect of segment three of the liver (image 21 of series 2) there is a 4.5 x 2.9 cm heterogeneously enhancing lesion that likely represents an additional metastasis. The enhancement characteristics of the remainder of the liver are somewhat irregular such that multiple additional smaller lesions are also suspected.  Previously noted well-defined low attenuation lesion in segment 2 of the liver is similar to the prior study measuring 1.7 x 1.5 cm, favored to represent a cyst.  A small volume of perihepatic ascites is new compared to the prior study.  The appearance of the gallbladder, spleen and bilateral adrenal glands is unremarkable.  Mild  diffuse ductal dilatation of the main pancreatic duct is similar to the prior examination.  Multifocal cortical thinning throughout the kidneys bilaterally, likely indicative of post infectious or inflammatory scarring.  Multiple subcentimeter low attenuation lesions in the kidneys bilaterally are too small to definitively characterize but are similar to the prior study and favored to represent small cysts.  As with the prior examination there is massive distension of the ascending colon and transverse colon which also appears to have diffuse wall thickening.  This abruptly transitions to normal caliber distal to a focal area of narrowing in the distal transverse colon best demonstrated on image 31 of series 2, which is highly suspicious for a primary colonic neoplasm (alternatively, this may represent a post ischemic stricture).  Extensive atherosclerosis throughout the abdominal and pelvic vasculature, without definite aneurysm.  Probable high-grade stenosis at the origin of the celiac axis and throughout the proximal superior mesenteric arteries are noted.  There is also prominent atherosclerotic plaque in the proximal inferior mesenteric artery as well.  Small volume of ascites adjacent to the spleen, and small volume of ascites in the lower anatomic pelvis. Status post hysterectomy.  Ovaries are not confidently identified may be  surgically absent or atrophic.  Musculoskeletal: There are no aggressive appearing lytic or blastic lesions noted in the visualized portions of the skeleton.  IMPRESSION: 1.  Interval development of at least two highly suspicious appearing hepatic masses, with numerous other suspected smaller lesions, highly concerning for hepatic metastases.  Given the focal narrowing of the distal transverse colon, these are favored to be metastases from a primary colonic neoplasm. 2.  Small volume of ascites. 3.  The focal narrowing of the distal transverse colon is associated with proximal colonic  dilatation and wall thickening similar to the prior study, which may suggest a colitis in these regions. 4.  Extensive atherosclerosis, including multivessel coronary artery disease and multivessel disease throughout the visceral vasculature, with areas of potentially hemodynamically significant stenosis in the proximal celiac axis and superior mesenteric arteries. 5.  Additional incidental findings, similar to prior studies, as above.   Original Report Authenticated By: Trudie Reed, M.D.   Dg Abd Acute W/chest  08/10/2012   *RADIOLOGY REPORT*  Clinical Data: Abdominal pain with blood in stool.  ACUTE ABDOMEN SERIES (ABDOMEN 2 VIEW & CHEST 1 VIEW)  Comparison: Acute abdominal series and CT 05/10/2012.  Findings: There is stable cardiomegaly status post CABG.  Aortic and mitral annular calcifications are noted.  Left subclavian pacemaker lead appears unchanged.  The lungs are clear.  There is borderline small bowel distension.  The colon appears slightly less distended than on the prior study, although there is a possible persistent filling defect in the region of the splenic flexure corresponding with the apple core lesion seen on prior CT. Diffuse aorto iliac atherosclerosis is noted.  There is no free intraperitoneal air.  IMPRESSION:  1.  Borderline small bowel distension.  Prior CT demonstrated a finding near the splenic flexure of the colon worrisome for colon cancer.  Consider follow-up CT. 2.  No evidence of perforation. 3.  No active cardiopulmonary process.   Original Report Authenticated By: Carey Bullocks, M.D.    Scheduled Meds: . antiseptic oral rinse  15 mL Mouth Rinse BID  . ciprofloxacin  400 mg Intravenous Q12H  . cloNIDine  0.2 mg Transdermal Weekly  . insulin aspart  0-9 Units Subcutaneous Q6H  . latanoprost  1 drop Both Eyes QHS  . metoprolol  50 mg Oral BID  . metronidazole  500 mg Intravenous Q8H   Continuous Infusions: . sodium chloride 50 mL/hr at 08/10/12 1406     Principal Problem:   Abdominal pain Active Problems:   Coronary artery disease   Asthma   Anemia   Diabetes mellitus   CHF, chronic   Diarrhea    Time spent: 30 minutes    [facility] M  [facility] Pager [phone]. If 7PM-7AM, please contact night-coverage at [email or web address], password [facility] 08/11/2012, 10:51 AM  LOS: 1 day

## 2012-08-11 NOTE — Progress Notes (Signed)
Patient seen, independently examined and chart reviewed. I agree with exam, assessment and plan discussed with Toya Smothers, NP.  Overall she feels somewhat better today. No nausea or vomiting. Abdominal pain has greatly decreased. Diarrhea maybe slowing down.  She appears calm and comfortable. Speech is fluent and clear. Abdomen soft, nontender, nondistended. Positive bowel sounds. Respirations unlabored. Cardiac exam unremarkable.  Afebrile, vital signs are stable. CT findings noted, there appears to be hepatic metastasis from presumed colonic neoplasm. There may be colitis. The focal narrowing of the distal transverse colon is seen. Extensive atherosclerosis including multivessel coronary artery disease as well as multivessel visceral disease seen.  Discussed with son as well as daughter in coordination with Dr. Lovell Sheehan. Discussed with the family and that cancer must be presumed and neck CT findings suggest metastatic spread. This cannot be cured and the risk of surgery is felt to be prohibitive. The family is not interested in surgery at this point. Certainly the patient is too weak for chemotherapy. We discussed that although not currently critically ill, progression of cancer will likely cause her death although the timetable is unclear. Obstruction may occur at any time however her advanceed age, history of fairly recent stroke, and evidence of severe multivessel atherosclerosis suggest very high risk for any surgery. We discussed CODE STATUS and DO NOT RESUSCITATE was recommended which the family is considering. Family expressed good understanding of the current issues and are interested in hospice at home to take her home soon.  Will treat presumptively for colitis, followup stool testing and manage pain. Once pain is under control and diarrhea is manageable plan discharge home with hospice.  [name], MD [facility] [phone]

## 2012-08-11 NOTE — Care Management Note (Unsigned)
    Page 1 of 1   08/11/2012     11:31:10 AM   CARE MANAGEMENT NOTE 08/11/2012  Patient:  Cindy Abbott, Cindy Abbott   Account Number:  [id number]  Date Initiated:  08/11/2012  Documentation initiated by:  Rosemary Holms  Subjective/Objective Assessment:   Pt from home where she lives with her son. Family wishing to get a PCP. Pt also want to talk to [facility] [facility]     Action/Plan:   Anticipated DC Date:  08/13/2012   Anticipated DC Plan:  HOME W HOSPICE CARE      DC Planning Services  CM consult      Choice offered to / List presented to:             [facility] agency  [facility]   Status of service:  In process, will continue to follow Medicare Important Message given?   (If response is "NO", the following Medicare IM given date fields will be blank) Date Medicare IM given:   Date Additional Medicare IM given:    Discharge Disposition:    Per UR Regulation:    If discussed at Long Length of Stay Meetings, dates discussed:    Comments:  08/11/12 Rosemary Holms RN BSN CM CM spoke with son [name] and daughter Melton Krebs. PCP Peter Garter MD with [facility] appt set up for 08/16/12, Wednesday. Referral with family consent also made to [facility]. Hospice to contact family.

## 2012-08-12 DIAGNOSIS — K529 Noninfective gastroenteritis and colitis, unspecified: Secondary | ICD-10-CM

## 2012-08-12 DIAGNOSIS — K5289 Other specified noninfective gastroenteritis and colitis: Secondary | ICD-10-CM

## 2012-08-12 LAB — GLUCOSE, CAPILLARY: Glucose-Capillary: 208 mg/dL — ABNORMAL HIGH (ref 70–99)

## 2012-08-12 NOTE — Progress Notes (Signed)
Discharge instructions gone over with son via telephone, verbalized understanding, out in stable condition via stretcher by  [facility].

## 2012-08-12 NOTE — Progress Notes (Signed)
[facility] PROGRESS NOTE  Cindy Abbott MVH:[id number] DOB: 07/08/1922 DOA: 08/10/2012 PCP: No PCP Per Patient  Assessment/Plan: 1. Suspected infectious colitis: Clinically improved. Continue empiric Cipro and Flagyl. GI pathogen panel pending. Because of incontinence cdiff PCR was unable to be obtained. 2. Suspected metastatic colon cancer: Pain controlled. Family does not want to pursue tissue diagnosis and does not want surgery or chemotherapy. No further investigation is desired. CT again demonstrated focal narrowing of the distal transverse colon highly suspicious for colonic neoplasm, this time with a suggestion of colitis. Interval development of at least 2 highly suspicious hepatic masses highly concerning for hepatic metastasis. Favored to be metastases from primary colonic neoplasm. Patient also has a history of one unresectable colonic polyp which was too big to remove by endoscopy. 3. Hypertension: Stable 4. Diabetes mellitus type 2: Stable. 5. Severe multivessel coronary artery disease and multivessel disease throughout the visceral vasculature   Continue empiric antibiotics for presumed infectious colitis  Home today with hospice  Code Status: DNR Family Communication: discussed with son Disposition Plan: home with hospice  [name], MD  [facility]  Pager [phone] If 7PM-7AM, please contact night-coverage at [email or web address], password [facility] 08/12/2012, 1:12 PM  LOS: 2 days   Clinical Summary: 79 year old woman with history of abnormal CT of the abdomen and pelvis with an apple core lesion suggesting partial colonic obstruction, worrisome for malignancy given her history of an unresectable colonic polyp. She was hospitalized 05/2012 for this and seen by general surgery and surgery was recommended, however given clinical improvement the family declined surgery and the patient went home. Overall she has done fairly well since that time and pain has been managed with  narcotics and bowel regimen. Just a few days prior this admission she was eating fine without difficulty. However the day prior to admission she developed left lower quadrant abdominal pain and diarrhea. So voluminous that she went through an entire pack of diapers. No bleeding. The day of admission her pain was so severe that she could not hide it from her son and so she was brought to the emergency department where she was noted to be afebrile with stable vital signs. Screening laboratory studies were unremarkable. Acute abdominal series suggested small bowel distention without evidence of perforation or definite obstruction. She was admitted for further evaluation.  Consultants:  General surgery  Procedures:  None  Antibiotics:  Ciprofloxacin 8/8 >>  Flagyl 8/8 >>  HPI/Subjective: No pain. Eating well. Diarrhea better. No new complaints.  Objective: Filed Vitals:   08/11/12 0907 08/11/12 2045 08/12/12 0409 08/12/12 0500  BP: 169/58 156/59 167/61   Pulse: 66 59 61   Temp:  97.4 F (36.3 C) 97.6 F (36.4 C)   TempSrc:  Oral Oral   Resp:  20 20   Height:      Weight:   55.792 kg (123 lb) 55.7 kg (122 lb 12.7 oz)  SpO2:  100% 100%     Intake/Output Summary (Last 24 hours) at 08/12/12 1312 Last data filed at 08/11/12 2000  Gross per 24 hour  Intake    120 ml  Output      0 ml  Net    120 ml     Filed Weights   08/11/12 0442 08/12/12 0409 08/12/12 0500  Weight: 55.43 kg (122 lb 3.2 oz) 55.792 kg (123 lb) 55.7 kg (122 lb 12.7 oz)    Exam:   Afebrile, vital signs stable.  General: Appears calm and comfortable. Eating lunch.  Cardiovascular: Regular rate and rhythm. No murmur, rub, gallop. No lower extremity edema.  Respiratory: Clear to auscultation bilaterally. No wheezes, rales, rhonchi. Normal respiratory effort.  Abdomen: Soft, nontender, nondistended.   Musculoskeletal: Left-sided weakness without change  Psychiatric: Grossly normal mood and affect.  Speech fluent and appropriate. Pleasantly confused, disoriented to location, month, year   Data Reviewed:   capillary blood sugars stable   Pending studies:   GI pathogen panel   cdiff PCR could not be obtained  Scheduled Meds: . antiseptic oral rinse  15 mL Mouth Rinse BID  . ciprofloxacin  400 mg Intravenous Q12H  . cloNIDine  0.2 mg Transdermal Weekly  . insulin aspart  0-9 Units Subcutaneous Q6H  . isosorbide mononitrate  30 mg Oral Daily  . latanoprost  1 drop Both Eyes QHS  . metoprolol  50 mg Oral BID  . metronidazole  500 mg Intravenous Q8H   Continuous Infusions: . sodium chloride 50 mL/hr at 08/11/12 1152    Principal Problem:   Abdominal pain Active Problems:   Coronary artery disease   Asthma   Anemia   Hypokalemia   Diabetes mellitus   CHF, chronic   Diarrhea

## 2012-08-12 NOTE — Progress Notes (Signed)
Patient's family has agreed to DO NOT RESUSCITATE and would like hospice involved. Social services has been arranging for home hospice.

## 2012-08-12 NOTE — Discharge Summary (Signed)
Physician Discharge Summary  Camala Talwar ZOX:[id number] DOB: 11/25/1922 DOA: 08/10/2012  PCP: No PCP Per Patient  Admit date: 08/10/2012 Discharge date: 08/12/2012  Recommendations for Outpatient Follow-up:  1. Patient will establish with hospice, diagnosis suspected metastatic colon cancer 2. Followup resolution of suspected infectious colitis 3. Followup pain control, symptom management  Follow-up Information   Follow up with [name], MD On 08/16/2012. (Please arrive early for a 1:00pm appt.)    Contact information:   [address] [place] [address] [phone]      Discharge Diagnoses:  1. Suspected infectious colitis 2. Suspected metastatic colon cancer 3. Diabetes mellitus type 2 4. Suspected dementia  Discharge Condition: Improved Disposition: Return home, initiate hospice  Diet recommendation: Diabetic diet  Filed Weights   08/11/12 0442 08/12/12 0409 08/12/12 0500  Weight: 55.43 kg (122 lb 3.2 oz) 55.792 kg (123 lb) 55.7 kg (122 lb 12.7 oz)    History of present illness:  79 year old woman with history of abnormal CT of the abdomen and pelvis with an apple core lesion suggesting partial colonic obstruction, worrisome for malignancy given her history of an unresectable colonic polyp. She was hospitalized 05/2012 for this and seen by general surgery and surgery was recommended, however given clinical improvement the family declined surgery and the patient went home. Overall she has done fairly well since that time and pain has been managed with narcotics and bowel regimen. Just a few days prior this admission she was eating fine without difficulty. However the day prior to admission she developed left lower quadrant abdominal pain and diarrhea. So voluminous that she went through an entire pack of diapers. No bleeding. The day of admission her pain was so severe that she could not hide it from her son and so she was brought to the emergency department where she  was noted to be afebrile with stable vital signs. Screening laboratory studies were unremarkable. Acute abdominal series suggested small bowel distention without evidence of perforation or definite obstruction. She was admitted for further evaluation.  Hospital Course:  Ms. Rau was admitted for further evaluation, case discussed with general surgery and CT of the abdomen and pelvis was ordered with full results as below. The narrowing of the colon was again demonstrated , this lesion is highly suspicious for primary colonic neoplasm. CT also demonstrated 2 hepatic masses which were new and highly suspicious for metastatic disease. Diarrhea improved with empiric treatment for possible infectious colitis, abdominal pain was well-controlled and patient is tolerating a diet. Imaging and other workup discussed in detail with son and daughter at bedside in conjunction with Dr. Lovell Sheehan as documented elsewhere. We discussed the suspected diagnosis of metastatic malignancy. Family wants her to be comfortable,does not wish for any further investigation and is interested in hospice. Surgery was thought to be very high risk secondary to age as well as known severe vascular disease. The family confirmed that they did not want to pursue any surgery, chemotherapy or other treatment. After further consultation patient is DNR. They understand it is difficult to project left expectancy but that she may pass quickly or live for an undetermined amount of time.  1. Suspected infectious colitis: Clinically improved. Continue empiric Cipro and Flagyl. GI pathogen panel pending. Because of incontinence cdiff PCR was unable to be obtained. 2. Suspected metastatic colon cancer: Pain controlled. Family does not want to pursue tissue diagnosis and does not want surgery or chemotherapy. No further investigation is desired. CT again demonstrated focal narrowing  of the distal transverse colon highly suspicious for colonic neoplasm, this  time with a suggestion of colitis. Interval development of at least 2 highly suspicious hepatic masses highly concerning for hepatic metastasis. Favored to be metastases from primary colonic neoplasm. Patient also has a history of one unresectable colonic polyp which was too big to remove by endoscopy. 3. Hypertension: Stable 4. Diabetes mellitus type 2: Stable. 5. Severe multivessel coronary artery disease and multivessel disease throughout the visceral vasculature discussed with family. No further investigation recommended.  Consultants:  General surgery Procedures:  None Antibiotics:  Ciprofloxacin 8/8 >>  Flagyl 8/8 >>  Discharge Instructions  Discharge Orders   Future Orders Complete By Expires     Diet Carb Modified  As directed     Discharge instructions  As directed     Comments:      Finish antibiotics for suspected inflammation/infection of the colon. Keep followup appointment with primary care physician. Call physician or seek immediate medical attention for uncontrolled pain, vomiting or worsening of condition.    Increase activity slowly  As directed         Medication List    STOP taking these medications       HYDROcodone-acetaminophen 5-500 MG per tablet  Commonly known as:  LORTAB 5  Replaced by:  HYDROcodone-acetaminophen 5-325 MG per tablet      TAKE these medications       albuterol (2.5 MG/3ML) 0.083% nebulizer solution  Commonly known as:  PROVENTIL  Take 2.5 mg by nebulization every 6 (six) hours as needed for wheezing.     aspirin 325 MG tablet  Take 325 mg by mouth daily.     ciprofloxacin 500 MG tablet  Commonly known as:  CIPRO  Take 1 tablet (500 mg total) by mouth 2 (two) times daily.     cloNIDine 0.2 mg/24hr patch  Commonly known as:  CATAPRES - Dosed in mg/24 hr  Place 1 patch onto the skin once a week. Friday     docusate sodium 100 MG capsule  Commonly known as:  COLACE  Take 100 mg by mouth daily as needed for constipation.      HYDROcodone-acetaminophen 5-325 MG per tablet  Commonly known as:  NORCO/VICODIN  Take 1 tablet by mouth every 6 (six) hours as needed for pain.     insulin glargine 100 UNIT/ML injection  Commonly known as:  LANTUS  Inject 15-18 Units into the skin at bedtime.     insulin lispro 100 UNIT/ML injection  Commonly known as:  HUMALOG  Inject 2-6 Units into the skin 3 (three) times daily before meals. Per sliding scale     isosorbide mononitrate 30 MG 24 hr tablet  Commonly known as:  IMDUR  Take 30 mg by mouth daily.     latanoprost 0.005 % ophthalmic solution  Commonly known as:  XALATAN  Place 1 drop into both eyes at bedtime.     metoprolol 50 MG tablet  Commonly known as:  LOPRESSOR  Take 50 mg by mouth 2 (two) times daily.     metroNIDAZOLE 500 MG tablet  Commonly known as:  FLAGYL  Take 1 tablet (500 mg total) by mouth 3 (three) times daily.       Allergies  Allergen Reactions  . Lisinopril Anaphylaxis and Swelling    The results of significant diagnostics from this hospitalization (including imaging, microbiology, ancillary and laboratory) are listed below for reference.    Significant Diagnostic Studies: Ct Abdomen W Contrast  08/10/2012   *RADIOLOGY REPORT*  Clinical Data: Right lower quadrant abdominal pain.  Diarrhea.  CT ABDOMEN WITH CONTRAST  Technique:  Multidetector CT imaging of the abdomen was performed following the standard protocol during bolus administration of intravenous contrast.  Contrast: 50mL OMNIPAQUE IOHEXOL 300 MG/ML  SOLN, OMNIPAQUE IOHEXOL 300 MG/ML  SOLN  Comparison: CT of the abdomen and pelvis 05/10/2012.  Findings:  Lung Bases: Marked cardiomegaly.  Severe calcifications of the mitral annulus, mitral valve and subvalvular apparatus, suggesting prior rheumatic fever.  A pacemaker lead terminating in the right ventricular apex.  Atherosclerotic calcifications in the distal left anterior descending and right coronary arteries.  Massive right  atrial dilatation.  Abdomen/Pelvis:  There is an irregular shaped heterogeneously enhancing centrally low attenuation lesion in the superior aspect of segment eight of the liver (image 9 of series 2) highly suspicious for a malignant neoplasm, likely a metastatic lesion. The main bulk of this lesion measures up to 3.8 x 2.9 cm, but the entire lesion is larger than this and simply difficult to discretely measure because of its irregular contours. Additionally, in the anterior aspect of segment three of the liver (image 21 of series 2) there is a 4.5 x 2.9 cm heterogeneously enhancing lesion that likely represents an additional metastasis. The enhancement characteristics of the remainder of the liver are somewhat irregular such that multiple additional smaller lesions are also suspected.  Previously noted well-defined low attenuation lesion in segment 2 of the liver is similar to the prior study measuring 1.7 x 1.5 cm, favored to represent a cyst.  A small volume of perihepatic ascites is new compared to the prior study.  The appearance of the gallbladder, spleen and bilateral adrenal glands is unremarkable.  Mild diffuse ductal dilatation of the main pancreatic duct is similar to the prior examination.  Multifocal cortical thinning throughout the kidneys bilaterally, likely indicative of post infectious or inflammatory scarring.  Multiple subcentimeter low attenuation lesions in the kidneys bilaterally are too small to definitively characterize but are similar to the prior study and favored to represent small cysts.  As with the prior examination there is massive distension of the ascending colon and transverse colon which also appears to have diffuse wall thickening.  This abruptly transitions to normal caliber distal to a focal area of narrowing in the distal transverse colon best demonstrated on image 31 of series 2, which is highly suspicious for a primary colonic neoplasm (alternatively, this may represent a  post ischemic stricture).  Extensive atherosclerosis throughout the abdominal and pelvic vasculature, without definite aneurysm.  Probable high-grade stenosis at the origin of the celiac axis and throughout the proximal superior mesenteric arteries are noted.  There is also prominent atherosclerotic plaque in the proximal inferior mesenteric artery as well.  Small volume of ascites adjacent to the spleen, and small volume of ascites in the lower anatomic pelvis. Status post hysterectomy.  Ovaries are not confidently identified may be surgically absent or atrophic.  Musculoskeletal: There are no aggressive appearing lytic or blastic lesions noted in the visualized portions of the skeleton.  IMPRESSION: 1.  Interval development of at least two highly suspicious appearing hepatic masses, with numerous other suspected smaller lesions, highly concerning for hepatic metastases.  Given the focal narrowing of the distal transverse colon, these are favored to be metastases from a primary colonic neoplasm. 2.  Small volume of ascites. 3.  The focal narrowing of the distal transverse colon is associated with proximal colonic dilatation and wall  thickening similar to the prior study, which may suggest a colitis in these regions. 4.  Extensive atherosclerosis, including multivessel coronary artery disease and multivessel disease throughout the visceral vasculature, with areas of potentially hemodynamically significant stenosis in the proximal celiac axis and superior mesenteric arteries. 5.  Additional incidental findings, similar to prior studies, as above.   Original Report Authenticated By: Trudie Reed, M.D.   Dg Abd Acute W/chest  08/10/2012   *RADIOLOGY REPORT*  Clinical Data: Abdominal pain with blood in stool.  ACUTE ABDOMEN SERIES (ABDOMEN 2 VIEW & CHEST 1 VIEW)  Comparison: Acute abdominal series and CT 05/10/2012.  Findings: There is stable cardiomegaly status post CABG.  Aortic and mitral annular calcifications  are noted.  Left subclavian pacemaker lead appears unchanged.  The lungs are clear.  There is borderline small bowel distension.  The colon appears slightly less distended than on the prior study, although there is a possible persistent filling defect in the region of the splenic flexure corresponding with the apple core lesion seen on prior CT. Diffuse aorto iliac atherosclerosis is noted.  There is no free intraperitoneal air.  IMPRESSION:  1.  Borderline small bowel distension.  Prior CT demonstrated a finding near the splenic flexure of the colon worrisome for colon cancer.  Consider follow-up CT. 2.  No evidence of perforation. 3.  No active cardiopulmonary process.   Original Report Authenticated By: Carey Bullocks, M.D.    Labs: Basic Metabolic Panel:  Recent Labs Lab 08/10/12 1002 08/11/12 0514  NA 137 135  K 4.5 3.4*  CL 104 105  CO2 24 22  GLUCOSE 171* 117*  BUN 15 12  CREATININE 0.96 0.83  CALCIUM 9.2 8.7   Liver Function Tests:  Recent Labs Lab 08/10/12 1002 08/11/12 0514  AST 13 11  ALT <5 <5  ALKPHOS 54 49  BILITOT 1.5* 1.1  PROT 6.5 5.5*  ALBUMIN 2.8* 2.4*   CBC:  Recent Labs Lab 08/10/12 1002 08/11/12 0514  WBC 5.9 4.1  NEUTROABS 4.6  --   HGB 9.3* 8.7*  HCT 30.0* 27.2*  MCV 83.1 82.9  PLT 287 256   CBG:  Recent Labs Lab 08/11/12 1127 08/11/12 1652 08/11/12 2113 08/12/12 0715 08/12/12 1128  GLUCAP 120* 149* 138* 183* 208*    Principal Problem:   Abdominal pain Active Problems:   Coronary artery disease   Asthma   Anemia   Hypokalemia   Diabetes mellitus   CHF, chronic   Diarrhea   Time coordinating discharge: 35 minutes  Signed:  [name], MD [facility] 08/12/2012, 1:26 PM

## 2012-08-14 LAB — GI PATHOGEN PANEL BY PCR, STOOL
C difficile toxin A/B: NEGATIVE
Campylobacter by PCR: NEGATIVE
E coli (STEC): NEGATIVE
G lamblia by PCR: NEGATIVE
Norovirus GI/GII: NEGATIVE
Rotavirus A by PCR: NEGATIVE
Salmonella by PCR: NEGATIVE

## 2012-09-19 ENCOUNTER — Inpatient Hospital Stay (HOSPITAL_COMMUNITY)
Admission: EM | Admit: 2012-09-19 | Discharge: 2012-09-25 | DRG: 330 | Disposition: A | Discharge status: Hospice | Attending: Family Medicine | Admitting: Family Medicine

## 2012-09-19 ENCOUNTER — Encounter (HOSPITAL_COMMUNITY): Payer: Self-pay | Admitting: Radiology

## 2012-09-19 ENCOUNTER — Emergency Department (HOSPITAL_COMMUNITY)

## 2012-09-19 DIAGNOSIS — Z515 Encounter for palliative care: Secondary | ICD-10-CM

## 2012-09-19 DIAGNOSIS — K56609 Unspecified intestinal obstruction, unspecified as to partial versus complete obstruction: Principal | ICD-10-CM | POA: Diagnosis present

## 2012-09-19 DIAGNOSIS — I10 Essential (primary) hypertension: Secondary | ICD-10-CM | POA: Diagnosis present

## 2012-09-19 DIAGNOSIS — E119 Type 2 diabetes mellitus without complications: Secondary | ICD-10-CM

## 2012-09-19 DIAGNOSIS — E1169 Type 2 diabetes mellitus with other specified complication: Secondary | ICD-10-CM | POA: Diagnosis present

## 2012-09-19 DIAGNOSIS — Z66 Do not resuscitate: Secondary | ICD-10-CM | POA: Diagnosis present

## 2012-09-19 DIAGNOSIS — I509 Heart failure, unspecified: Secondary | ICD-10-CM

## 2012-09-19 DIAGNOSIS — C189 Malignant neoplasm of colon, unspecified: Secondary | ICD-10-CM

## 2012-09-19 DIAGNOSIS — I4891 Unspecified atrial fibrillation: Secondary | ICD-10-CM | POA: Diagnosis present

## 2012-09-19 DIAGNOSIS — J45909 Unspecified asthma, uncomplicated: Secondary | ICD-10-CM | POA: Diagnosis present

## 2012-09-19 DIAGNOSIS — Z951 Presence of aortocoronary bypass graft: Secondary | ICD-10-CM

## 2012-09-19 DIAGNOSIS — Z950 Presence of cardiac pacemaker: Secondary | ICD-10-CM | POA: Diagnosis present

## 2012-09-19 DIAGNOSIS — K6389 Other specified diseases of intestine: Secondary | ICD-10-CM

## 2012-09-19 DIAGNOSIS — C787 Secondary malignant neoplasm of liver and intrahepatic bile duct: Secondary | ICD-10-CM | POA: Diagnosis present

## 2012-09-19 DIAGNOSIS — Z9861 Coronary angioplasty status: Secondary | ICD-10-CM

## 2012-09-19 DIAGNOSIS — Z794 Long term (current) use of insulin: Secondary | ICD-10-CM

## 2012-09-19 DIAGNOSIS — I2510 Atherosclerotic heart disease of native coronary artery without angina pectoris: Secondary | ICD-10-CM | POA: Diagnosis present

## 2012-09-19 DIAGNOSIS — D649 Anemia, unspecified: Secondary | ICD-10-CM | POA: Diagnosis present

## 2012-09-19 DIAGNOSIS — C184 Malignant neoplasm of transverse colon: Secondary | ICD-10-CM | POA: Diagnosis present

## 2012-09-19 LAB — HEPATIC FUNCTION PANEL
ALT: <5 U/L (ref 0–35)
Albumin: 3.1 g/dL — ABNORMAL LOW (ref 3.5–5.2)
Alkaline Phosphatase: 61 U/L (ref 39–117)
Indirect Bilirubin: 0.5 mg/dL (ref 0.3–0.9)
Total Protein: 6.9 g/dL (ref 6.0–8.3)

## 2012-09-19 LAB — BASIC METABOLIC PANEL
CO2: 28 mEq/L (ref 19–32)
Calcium: 10 mg/dL (ref 8.4–10.5)
Chloride: 100 mEq/L (ref 96–112)
Creatinine, Ser: 1.19 mg/dL — ABNORMAL HIGH (ref 0.50–1.10)
GFR calc Af Amer: 46 mL/min — ABNORMAL LOW (ref >90)
Sodium: 136 mEq/L (ref 135–145)

## 2012-09-19 LAB — CBC WITH DIFFERENTIAL/PLATELET
Basophils Absolute: 0 10*3/uL (ref 0.0–0.1)
Basophils Relative: 0 % (ref 0–1)
Lymphocytes Relative: 11 % — ABNORMAL LOW (ref 12–46)
MCHC: 32 g/dL (ref 30.0–36.0)
Monocytes Absolute: 0.4 10*3/uL (ref 0.1–1.0)
Neutro Abs: 4.9 10*3/uL (ref 1.7–7.7)
Platelets: 308 10*3/uL (ref 150–400)
RDW: 17.9 % — ABNORMAL HIGH (ref 11.5–15.5)
WBC: 6 10*3/uL (ref 4.0–10.5)

## 2012-09-19 MED ORDER — ONDANSETRON HCL 4 MG/2ML IJ SOLN
4.0000 mg | Freq: Once | INTRAMUSCULAR | Status: AC
  Administered 2012-09-19: 4 mg via INTRAVENOUS
  Filled 2012-09-19: qty 2

## 2012-09-19 MED ORDER — CLONIDINE HCL 0.2 MG/24HR TD PTWK
0.2000 mg | MEDICATED_PATCH | TRANSDERMAL | Status: DC
  Administered 2012-09-25: 0.2 mg via TRANSDERMAL
  Filled 2012-09-19: qty 1

## 2012-09-19 MED ORDER — ENOXAPARIN SODIUM 40 MG/0.4ML ~~LOC~~ SOLN
40.0000 mg | SUBCUTANEOUS | Status: DC
  Administered 2012-09-20: 40 mg via SUBCUTANEOUS
  Filled 2012-09-19: qty 0.4

## 2012-09-19 MED ORDER — INSULIN ASPART 100 UNIT/ML ~~LOC~~ SOLN: 0.0000 [IU] | SUBCUTANEOUS | Status: DC

## 2012-09-19 MED ORDER — MORPHINE SULFATE 2 MG/ML IJ SOLN
2.0000 mg | INTRAMUSCULAR | Status: DC | PRN
  Administered 2012-09-19 – 2012-09-23 (×16): 2 mg via INTRAVENOUS
  Filled 2012-09-19 (×16): qty 1

## 2012-09-19 MED ORDER — ONDANSETRON HCL 4 MG/2ML IJ SOLN
4.0000 mg | Freq: Four times a day (QID) | INTRAMUSCULAR | Status: DC | PRN
  Administered 2012-09-19: 4 mg via INTRAVENOUS
  Filled 2012-09-19: qty 2

## 2012-09-19 MED ORDER — ALBUTEROL SULFATE (5 MG/ML) 0.5% IN NEBU: 2.5000 mg | INHALATION_SOLUTION | RESPIRATORY_TRACT | Status: DC | PRN

## 2012-09-19 MED ORDER — SODIUM CHLORIDE 0.9 % IV BOLUS (SEPSIS)
1000.0000 mL | Freq: Once | INTRAVENOUS | Status: AC
  Administered 2012-09-19: 1000 mL via INTRAVENOUS

## 2012-09-19 MED ORDER — ACETAMINOPHEN 325 MG PO TABS: 650.0000 mg | ORAL_TABLET | Freq: Four times a day (QID) | ORAL | Status: DC | PRN

## 2012-09-19 MED ORDER — IOHEXOL 300 MG/ML  SOLN
100.0000 mL | Freq: Once | INTRAMUSCULAR | Status: AC | PRN
  Administered 2012-09-19: 100 mL via INTRAVENOUS

## 2012-09-19 MED ORDER — SODIUM CHLORIDE 0.9 % IV SOLN
INTRAVENOUS | Status: DC
  Administered 2012-09-19 – 2012-09-20 (×2): via INTRAVENOUS

## 2012-09-19 MED ORDER — HYDRALAZINE HCL 20 MG/ML IJ SOLN
10.0000 mg | Freq: Four times a day (QID) | INTRAMUSCULAR | Status: DC | PRN
  Administered 2012-09-20 – 2012-09-21 (×2): 10 mg via INTRAVENOUS
  Filled 2012-09-19 (×2): qty 1

## 2012-09-19 MED ORDER — ACETAMINOPHEN 650 MG RE SUPP: 650.0000 mg | Freq: Four times a day (QID) | RECTAL | Status: DC | PRN

## 2012-09-19 MED ORDER — ONDANSETRON HCL 4 MG PO TABS: 4.0000 mg | ORAL_TABLET | Freq: Four times a day (QID) | ORAL | Status: DC | PRN

## 2012-09-19 MED ORDER — SODIUM CHLORIDE 0.9 % IV SOLN: INTRAVENOUS | Status: DC

## 2012-09-19 NOTE — ED Notes (Signed)
Son reports that patient is complaining of pain.  Did not have pain when RN was rounding.

## 2012-09-19 NOTE — H&P (Signed)
[facility] History and Physical  Cindy Abbott YNW:[id number] DOB: 05-07-1922 DOA: 09/19/2012   PCP: Georganna Skeans Specialists: The patient was seen by Dr. Lovell Sheehan (surgeon) during her last hospitalization  Chief Complaint: Nausea, vomiting, abdominal pain  HPI: Cindy Abbott is a 79 y.o. female with a recently diagnosed colonic mass, which is thought to be cancer. She was also found to have lesions in her liver, which were suggestive of metastatic process. Patient was seen by general surgery during previous hospitalization and it was determined that she's not a candidate for any surgical procedure. She also has a history of CAD, status post CABG, pacemaker, diabetes, hypertension, suspected dementia. Patient is confused at baseline and unable to provide much history. According to the son and daughter, who are in the room with her, she's been having a lot of abdominal pain since last night. Medications have not been working well. This morning she started to have some pain and the son felt as if she had to have a bowel movement, but then she vomited up foul-smelling emesis, which smelled like feces. She has been constipated for 4 days. She's become weak. And, because of this episode of emesis he called 911. History is limited due to cognitive impairment.  Home Medications: Prior to Admission medications   Medication Sig Start Date End Date Taking? Authorizing Provider  aspirin 325 MG tablet Take 325 mg by mouth every morning.    Yes Historical Provider, MD  bumetanide (BUMEX) 1 MG tablet Take 1 mg by mouth daily as needed (for fluid retention).   Yes Historical Provider, MD  cloNIDine (CATAPRES - DOSED IN MG/24 HR) 0.2 mg/24hr patch Place 1 patch onto the skin once a week.  08/23/11 09/19/12 Yes Micki Riley, MD  docusate sodium (COLACE) 100 MG capsule Take 100 mg by mouth daily.    Yes Historical Provider, MD  HYDROcodone-acetaminophen (NORCO/VICODIN) 5-325 MG per tablet Take 0.5 tablets by mouth  every 6 (six) hours as needed for pain.   Yes Historical Provider, MD  insulin glargine (LANTUS) 100 UNIT/ML injection Inject 15-18 Units into the skin at bedtime. 09/14/11 09/19/12 Yes Daniel J Angiulli, PA-C  insulin lispro (HUMALOG) 100 UNIT/ML injection Inject 2-6 Units into the skin 3 (three) times daily before meals. Per sliding scale   Yes Historical Provider, MD  ipratropium-albuterol (DUONEB) 0.5-2.5 (3) MG/3ML SOLN Take 3 mLs by nebulization every 6 (six) hours as needed.   Yes Historical Provider, MD  isosorbide mononitrate (IMDUR) 30 MG 24 hr tablet Take 30 mg by mouth daily.   Yes Historical Provider, MD  metoprolol (LOPRESSOR) 50 MG tablet Take 50 mg by mouth 2 (two) times daily.   Yes Historical Provider, MD  nitroGLYCERIN (NITROSTAT) 0.4 MG SL tablet Place 0.4 mg under the tongue every 5 (five) minutes as needed for chest pain.   Yes Historical Provider, MD  potassium chloride SA (K-DUR,KLOR-CON) 20 MEQ tablet Take 20 mEq by mouth daily.   Yes Historical Provider, MD    Allergies:  Allergies  Allergen Reactions  . Lisinopril Anaphylaxis and Swelling    Past Medical History: Past Medical History  Diagnosis Date  . Coronary artery disease   . Diabetes mellitus   . Pacemaker   . Asthma   . Stroke, acute, thrombotic 08/19/2011    Unknown when in the past but she has received TPA for a CVA  . Anemia   . Colon polyps     Followed by GI at [facility]. h/o numerous polyps 2008  with large one that could not be removed endoscopically. patient opted against surgical resection. yearly colonoscopies until 2011. previously on coumadin but was held due to slow GI bleed related to large polyp  . A-fib     previously on coumadin but withheld due to chronic GI bleed  . Cancer 2014    colon    Past Surgical History  Procedure Laterality Date  . Cardiac surgery    . Coronary angioplasty with stent placement    . Coronary artery bypass graft    . Abdominal hysterectomy      Social  History:  reports that she has never smoked. She does not have any smokeless tobacco history on file. She reports that she does not drink alcohol or use illicit drugs.  Living Situation: Lives with her son and daughter Activity Level: Requires total assistance with ambulation.   Family History:  Family History  Problem Relation Age of Onset  . Colon cancer Neg Hx   . Diabetes Son      Review of Systems - Unable to obtain due to confusion  Physical Examination  Filed Vitals:   09/19/12 1644 09/19/12 2125 09/19/12 2143  BP: 169/54 195/53 184/79  Pulse: 61 60 60  Temp: 98.6 F (37 C) 98.9 F (37.2 C) 98.2 F (36.8 C)  TempSrc: Oral Oral Oral  Resp: 18 16 18   Weight:   52.753 kg (116 lb 4.8 oz)  SpO2: 96% 99% 98%    General appearance: alert, distracted and no distress Head: Normocephalic, without obvious abnormality, atraumatic Eyes: conjunctivae/corneas clear. PERRL, EOM's intact. Fundi benign. Throat: dry mm Neck: no adenopathy, no carotid bruit, no JVD, supple, symmetrical, trachea midline and thyroid not enlarged, symmetric, no tenderness/mass/nodules Resp: clear to auscultation bilaterally Cardio: S1-S2 is normal. Regular systolic murmur appreciated over the precordium. No S3, S4. No rubs, or bruits GI: Abdomen is mildly distended. Is tender diffusely. No rebound, rigidity, or guarding. Bowel sounds absent. No organomegaly appreciated Extremities: extremities normal, atraumatic, no cyanosis or edema Pulses: 2+ and symmetric Skin: Skin color, texture, turgor normal. No rashes or lesions Lymph nodes: Cervical, supraclavicular, and axillary nodes normal. Neurologic: Grossly normal  Laboratory Data: Results for orders placed during the hospital encounter of 09/19/12 (from the past 48 hour(s))  CBC WITH DIFFERENTIAL     Status: Abnormal   Collection Time    09/19/12  5:03 PM      Result Value Range   WBC 6.0  4.0 - 10.5 K/uL   RBC 3.82 (*) 3.87 - 5.11 MIL/uL    Hemoglobin 9.6 (*) 12.0 - 15.0 g/dL   HCT 19.1 (*) 47.8 - 29.5 %   MCV 78.5  78.0 - 100.0 fL   MCH 25.1 (*) 26.0 - 34.0 pg   MCHC 32.0  30.0 - 36.0 g/dL   RDW 62.1 (*) 30.8 - 65.7 %   Platelets 308  150 - 400 K/uL   Neutrophils Relative % 82 (*) 43 - 77 %   Neutro Abs 4.9  1.7 - 7.7 K/uL   Lymphocytes Relative 11 (*) 12 - 46 %   Lymphs Abs 0.7  0.7 - 4.0 K/uL   Monocytes Relative 7  3 - 12 %   Monocytes Absolute 0.4  0.1 - 1.0 K/uL   Eosinophils Relative 0  0 - 5 %   Eosinophils Absolute 0.0  0.0 - 0.7 K/uL   Basophils Relative 0  0 - 1 %   Basophils Absolute 0.0  0.0 - 0.1  K/uL  BASIC METABOLIC PANEL     Status: Abnormal   Collection Time    09/19/12  5:03 PM      Result Value Range   Sodium 136  135 - 145 mEq/L   Potassium 4.4  3.5 - 5.1 mEq/L   Chloride 100  96 - 112 mEq/L   CO2 28  19 - 32 mEq/L   Glucose, Bld 159 (*) 70 - 99 mg/dL   BUN 17  6 - 23 mg/dL   Creatinine, Ser 5.62 (*) 0.50 - 1.10 mg/dL   Calcium 13.0  8.4 - 86.5 mg/dL   GFR calc non Af Amer 40 (*) >90 mL/min   GFR calc Af Amer 46 (*) >90 mL/min   Comment: (NOTE)     The eGFR has been calculated using the CKD EPI equation.     This calculation has not been validated in all clinical situations.     eGFR's persistently <90 mL/min signify possible Chronic Kidney     Disease.  HEPATIC FUNCTION PANEL     Status: Abnormal   Collection Time    09/19/12  5:03 PM      Result Value Range   Total Protein 6.9  6.0 - 8.3 g/dL   Albumin 3.1 (*) 3.5 - 5.2 g/dL   AST 18  0 - 37 U/L   ALT <5  0 - 35 U/L   Alkaline Phosphatase 61  39 - 117 U/L   Total Bilirubin 0.7  0.3 - 1.2 mg/dL   Bilirubin, Direct 0.2  0.0 - 0.3 mg/dL   Indirect Bilirubin 0.5  0.3 - 0.9 mg/dL    Radiology Reports: Ct Abdomen Pelvis W Contrast  09/19/2012   CLINICAL DATA:  Abdominal pain, vomiting  EXAM: CT ABDOMEN AND PELVIS WITH CONTRAST  TECHNIQUE: Multidetector CT imaging of the abdomen and pelvis was performed using the standard protocol  following bolus administration of intravenous contrast.  CONTRAST:  OMNIPAQUE IOHEXOL 300 MG/ML  SOLN  COMPARISON:  CT scan 08/10/2012  FINDINGS: Cardiomegaly is noted. Sagittal images of the spine shows diffuse osteopenia. No destructive bony lesions are noted.  There is perihepatic ascites. There is progression in size and number of liver metastatic disease. The largest lesion in the right hepatic dome measures 4.2 cm increased in size from prior exam. There are additional at least 2 lesions in right hepatic dome. The largest lesion in left hepatic lobe anteriorly measures 4.4 cm. Stable from prior exam.  Atherosclerotic calcifications of the abdominal aorta, splenic artery, hepatic artery SMA and iliac arteries again noted. No evidence of aortic aneurysm.  The pancreas, spleen and adrenal glands are unremarkable. There are multiple distended small bowel loops throughout the abdomen with multiple air-fluid levels consistent with significant ileus or partial small bowel obstruction. Mild distended gallbladder. Mild pericholecystic fluid. No calcified gallstones are noted within gallbladder. There is significant distension of the right colon and transverse colon with fluid. There is a obstructing apple core malignant lesion in distal transverse colon best seen in axial image 22 measures 3.6 cm in length by 2.3 cm. Findings are consistent with colonic obstruction.  The left colon and sigmoid colon is collapsed with small caliber. Some residual stool noted within distal sigmoid colon and rectum.  Enhanced kidneys are symmetrical in size. No hydronephrosis or hydroureter. Mild bilateral cortical thinning probable due to atrophy.  Delayed renal images shows bilateral renal symmetrical excretion. Bilateral visualized proximal ureter is unremarkable.  IMPRESSION: 1. There are multiple distended  small bowel loops throughout the abdomen with multiple air-fluid levels consistent with significant ileus or partial small  bowel obstruction. Mild distended gallbladder. Mild pericholecystic fluid. No calcified gallstones are noted within gallbladder. There is significant distension of the right colon and transverse colon with fluid. There is a obstructing apple core malignant lesion in distal transverse colon best seen in axial image 22 measures 3.6 cm in length by 2.3 cm. Findings are consistent with colonic obstruction.  2. No hydronephrosis or hydroureter. 3. Small amount of perihepatic ascites. 4. Progression of metastatic disease within the liver.  CriticalValue/emergent results were called by telephone at the time of interpretation on 09/19/2012 at 7:36 PMto JOSEPH ZAMMIT , who verbally acknowledged these results.   Electronically Signed   By: Natasha Mead   On: 09/19/2012 19:37    Problem List  Principal Problem:   Colonic obstruction Active Problems:   Coronary artery disease   Diabetes mellitus   S/P placement of cardiac pacemaker   Colonic mass   Liver metastases   Assessment: This is 79 year old, African American female, with a past medical history as stated earlier, who presents with nausea, vomiting. CT is suggestive of colonic obstruction due to mass. She likely has colon cancer with hepatic metastases. It looks like the metastases have progressed.  Plan: #1 colonic obstruction: We will place an NG tube to provide some relief. She'll be kept n.p.o. The family is asking if she is a candidate for any kind of palliative procedure. We will consult general surgery to evaluate her in the morning and talk to the family.  #2 presumed colon cancer with metastases: Family did not want to pursue further investigation and testing during the previous hospitalization. She was actually sent with hospice. She appears to have poor prognosis now that she has an obstruction.  #3 history of diabetes mellitus, type II: Hold her insulin. Keep her on sliding scale for now.  #4 history of coronary artery disease, status post  CABG and pacemaker: These issues appear to be stable.   DVT Prophylaxis: Lovenox Code Status: DO NOT RESUSCITATE Family Communication: Discussed with the son and daughter. They both are surprised by the sudden progression of her cancer. They're wondering about treatment options.  Disposition Plan: Admit to MedSurg   Further management decisions will depend on results of further testing and patient's response to treatment.  [facility]  [facility] Pager [phone]  If 7PM-7AM, please contact night-coverage [email or web address] Password [facility]  09/19/2012, 10:05 PM

## 2012-09-19 NOTE — ED Notes (Signed)
Son Bushra Denman [phone].

## 2012-09-19 NOTE — ED Provider Notes (Signed)
CSN: [id number]     Arrival date & time 09/19/12  1640 History   First MD Initiated Contact with Patient 09/19/12 1657     Chief Complaint  Patient presents with  . Emesis  . Abdominal Pain   (Consider location/radiation/quality/duration/timing/severity/associated sxs/prior Treatment) Patient is a 79 y.o. female presenting with vomiting and abdominal pain. The history is provided by a relative (the son states she is vomiting stool today).  Emesis Severity:  Moderate Timing:  Constant Quality:  Malodorous material Feeding tolerance: nothing. Progression:  Unchanged Associated symptoms: abdominal pain   Associated symptoms: no diarrhea and no headaches   Abdominal Pain Associated symptoms: vomiting   Associated symptoms: no chest pain, no cough, no diarrhea, no fatigue and no hematuria     Past Medical History  Diagnosis Date  . Coronary artery disease   . Diabetes mellitus   . Pacemaker   . Asthma   . Stroke, acute, thrombotic 08/19/2011    Unknown when in the past but she has received TPA for a CVA  . Anemia   . Colon polyps     Followed by GI at [facility]. h/o numerous polyps 2008 with large one that could not be removed endoscopically. patient opted against surgical resection. yearly colonoscopies until 2011. previously on coumadin but was held due to slow GI bleed related to large polyp  . A-fib     previously on coumadin but withheld due to chronic GI bleed  . Cancer 2014    colon   Past Surgical History  Procedure Laterality Date  . Cardiac surgery    . Coronary angioplasty with stent placement    . Coronary artery bypass graft    . Abdominal hysterectomy     Family History  Problem Relation Age of Onset  . Colon cancer Neg Hx    History  Substance Use Topics  . Smoking status: Never Smoker   . Smokeless tobacco: Not on file  . Alcohol Use: No   OB History   Grav Para Term Preterm Abortions TAB SAB Ect Mult Living                 Review of Systems   Constitutional: Negative for appetite change and fatigue.  HENT: Negative for congestion, sinus pressure and ear discharge.   Eyes: Negative for discharge.  Respiratory: Negative for cough.   Cardiovascular: Negative for chest pain.  Gastrointestinal: Positive for vomiting and abdominal pain. Negative for diarrhea.  Genitourinary: Negative for frequency and hematuria.  Musculoskeletal: Negative for back pain.  Skin: Negative for rash.  Neurological: Negative for seizures and headaches.  Psychiatric/Behavioral: Negative for hallucinations.    Allergies  Lisinopril  Home Medications   Current Outpatient Rx  Name  Route  Sig  Dispense  Refill  . aspirin 325 MG tablet   Oral   Take 325 mg by mouth every morning.          . bumetanide (BUMEX) 1 MG tablet   Oral   Take 1 mg by mouth daily as needed (for fluid retention).         . cloNIDine (CATAPRES - DOSED IN MG/24 HR) 0.2 mg/24hr patch   Transdermal   Place 1 patch onto the skin once a week.          . docusate sodium (COLACE) 100 MG capsule   Oral   Take 100 mg by mouth daily.          Marland Kitchen HYDROcodone-acetaminophen (NORCO/VICODIN) 5-325 MG per  tablet   Oral   Take 0.5 tablets by mouth every 6 (six) hours as needed for pain.         Marland Kitchen insulin glargine (LANTUS) 100 UNIT/ML injection   Subcutaneous   Inject 15-18 Units into the skin at bedtime.         . insulin lispro (HUMALOG) 100 UNIT/ML injection   Subcutaneous   Inject 2-6 Units into the skin 3 (three) times daily before meals. Per sliding scale         . ipratropium-albuterol (DUONEB) 0.5-2.5 (3) MG/3ML SOLN   Nebulization   Take 3 mLs by nebulization every 6 (six) hours as needed.         . isosorbide mononitrate (IMDUR) 30 MG 24 hr tablet   Oral   Take 30 mg by mouth daily.         . metoprolol (LOPRESSOR) 50 MG tablet   Oral   Take 50 mg by mouth 2 (two) times daily.         . nitroGLYCERIN (NITROSTAT) 0.4 MG SL tablet    Sublingual   Place 0.4 mg under the tongue every 5 (five) minutes as needed for chest pain.         . potassium chloride SA (K-DUR,KLOR-CON) 20 MEQ tablet   Oral   Take 20 mEq by mouth daily.          BP 169/54  Pulse 61  Temp(Src) 98.6 F (37 C) (Oral)  Resp 18  SpO2 96% Physical Exam  Constitutional: She appears well-developed.  HENT:  Head: Normocephalic.  Eyes: Conjunctivae and EOM are normal. No scleral icterus.  Neck: Neck supple. No thyromegaly present.  Cardiovascular: Normal rate and regular rhythm.  Exam reveals no gallop and no friction rub.   No murmur heard. Pulmonary/Chest: No stridor. She has no wheezes. She has no rales. She exhibits no tenderness.  Abdominal: She exhibits distension. There is tenderness. There is no rebound.  Musculoskeletal: Normal range of motion. She exhibits no edema.  Lymphadenopathy:    She has no cervical adenopathy.  Neurological: Coordination normal.  Oriented to person only  Skin: No rash noted. No erythema.  Psychiatric: She has a normal mood and affect. Her behavior is normal.    ED Course  Procedures (including critical care time) Labs Review Labs Reviewed  CBC WITH DIFFERENTIAL - Abnormal; Notable for the following:    RBC 3.82 (*)    Hemoglobin 9.6 (*)    HCT 30.0 (*)    MCH 25.1 (*)    RDW 17.9 (*)    Neutrophils Relative % 82 (*)    Lymphocytes Relative 11 (*)    All other components within normal limits  BASIC METABOLIC PANEL - Abnormal; Notable for the following:    Glucose, Bld 159 (*)    Creatinine, Ser 1.19 (*)    GFR calc non Af Amer 40 (*)    GFR calc Af Amer 46 (*)    All other components within normal limits  HEPATIC FUNCTION PANEL - Abnormal; Notable for the following:    Albumin 3.1 (*)    All other components within normal limits  URINALYSIS, ROUTINE W REFLEX MICROSCOPIC   Imaging Review Ct Abdomen Pelvis W Contrast  09/19/2012   CLINICAL DATA:  Abdominal pain, vomiting  EXAM: CT ABDOMEN AND  PELVIS WITH CONTRAST  TECHNIQUE: Multidetector CT imaging of the abdomen and pelvis was performed using the standard protocol following bolus administration of intravenous contrast.  CONTRAST:  OMNIPAQUE IOHEXOL 300 MG/ML  SOLN  COMPARISON:  CT scan 08/10/2012  FINDINGS: Cardiomegaly is noted. Sagittal images of the spine shows diffuse osteopenia. No destructive bony lesions are noted.  There is perihepatic ascites. There is progression in size and number of liver metastatic disease. The largest lesion in the right hepatic dome measures 4.2 cm increased in size from prior exam. There are additional at least 2 lesions in right hepatic dome. The largest lesion in left hepatic lobe anteriorly measures 4.4 cm. Stable from prior exam.  Atherosclerotic calcifications of the abdominal aorta, splenic artery, hepatic artery SMA and iliac arteries again noted. No evidence of aortic aneurysm.  The pancreas, spleen and adrenal glands are unremarkable. There are multiple distended small bowel loops throughout the abdomen with multiple air-fluid levels consistent with significant ileus or partial small bowel obstruction. Mild distended gallbladder. Mild pericholecystic fluid. No calcified gallstones are noted within gallbladder. There is significant distension of the right colon and transverse colon with fluid. There is a obstructing apple core malignant lesion in distal transverse colon best seen in axial image 22 measures 3.6 cm in length by 2.3 cm. Findings are consistent with colonic obstruction.  The left colon and sigmoid colon is collapsed with small caliber. Some residual stool noted within distal sigmoid colon and rectum.  Enhanced kidneys are symmetrical in size. No hydronephrosis or hydroureter. Mild bilateral cortical thinning probable due to atrophy.  Delayed renal images shows bilateral renal symmetrical excretion. Bilateral visualized proximal ureter is unremarkable.  IMPRESSION: 1. There are multiple  distended small bowel loops throughout the abdomen with multiple air-fluid levels consistent with significant ileus or partial small bowel obstruction. Mild distended gallbladder. Mild pericholecystic fluid. No calcified gallstones are noted within gallbladder. There is significant distension of the right colon and transverse colon with fluid. There is a obstructing apple core malignant lesion in distal transverse colon best seen in axial image 22 measures 3.6 cm in length by 2.3 cm. Findings are consistent with colonic obstruction.  2. No hydronephrosis or hydroureter. 3. Small amount of perihepatic ascites. 4. Progression of metastatic disease within the liver.  CriticalValue/emergent results were called by telephone at the time of interpretation on 09/19/2012 at 7:36 PMto Dekayla Prestridge , who verbally acknowledged these results.   Electronically Signed   By: Natasha Mead   On: 09/19/2012 19:37    MDM   1. SBO (small bowel obstruction)       Benny Lennert, MD 09/19/12 2016

## 2012-09-19 NOTE — ED Notes (Signed)
Per [facility] pt c/o Shirah emesis and lower abdominal pain. Recently diagnosed with colon CA.

## 2012-09-20 DIAGNOSIS — C801 Malignant (primary) neoplasm, unspecified: Secondary | ICD-10-CM

## 2012-09-20 DIAGNOSIS — C787 Secondary malignant neoplasm of liver and intrahepatic bile duct: Secondary | ICD-10-CM

## 2012-09-20 LAB — GLUCOSE, CAPILLARY
Glucose-Capillary: 91 mg/dL (ref 70–99)
Glucose-Capillary: 57 mg/dL — ABNORMAL LOW (ref 70–99)
Glucose-Capillary: 93 mg/dL (ref 70–99)
Glucose-Capillary: 87 mg/dL (ref 70–99)

## 2012-09-20 LAB — CBC
HCT: 29.2 % — ABNORMAL LOW (ref 36.0–46.0)
Hemoglobin: 9.2 g/dL — ABNORMAL LOW (ref 12.0–15.0)
MCH: 24.9 pg — ABNORMAL LOW (ref 26.0–34.0)
MCHC: 31.5 g/dL (ref 30.0–36.0)
MCV: 78.9 fL (ref 78.0–100.0)

## 2012-09-20 LAB — COMPREHENSIVE METABOLIC PANEL
ALT: <5 U/L (ref 0–35)
AST: 17 U/L (ref 0–37)
Albumin: 2.7 g/dL — ABNORMAL LOW (ref 3.5–5.2)
CO2: 27 mEq/L (ref 19–32)
Calcium: 9.2 mg/dL (ref 8.4–10.5)
Sodium: 140 mEq/L (ref 135–145)
Total Protein: 6 g/dL (ref 6.0–8.3)

## 2012-09-20 MED ORDER — BIOTENE DRY MOUTH MT LIQD
15.0000 mL | Freq: Two times a day (BID) | OROMUCOSAL | Status: DC
  Administered 2012-09-20 – 2012-09-25 (×9): 15 mL via OROMUCOSAL

## 2012-09-20 MED ORDER — CHLORHEXIDINE GLUCONATE 0.12 % MT SOLN
15.0000 mL | Freq: Two times a day (BID) | OROMUCOSAL | Status: DC
  Administered 2012-09-21 – 2012-09-25 (×8): 15 mL via OROMUCOSAL
  Filled 2012-09-20 (×8): qty 15

## 2012-09-20 MED ORDER — DEXTROSE 50 % IV SOLN
25.0000 mL | Freq: Once | INTRAVENOUS | Status: AC | PRN
  Administered 2012-09-20: 25 mL via INTRAVENOUS

## 2012-09-20 MED ORDER — DEXTROSE 50 % IV SOLN
25.0000 mL | Freq: Once | INTRAVENOUS | Status: AC | PRN
  Filled 2012-09-20: qty 50

## 2012-09-20 MED ORDER — DEXTROSE-NACL 5-0.45 % IV SOLN
INTRAVENOUS | Status: DC
  Administered 2012-09-20 – 2012-09-24 (×5): via INTRAVENOUS

## 2012-09-20 MED ORDER — METOPROLOL TARTRATE 1 MG/ML IV SOLN
2.5000 mg | Freq: Four times a day (QID) | INTRAVENOUS | Status: DC
  Administered 2012-09-20 – 2012-09-21 (×4): 2.5 mg via INTRAVENOUS
  Filled 2012-09-20 (×6): qty 5

## 2012-09-20 MED ORDER — DEXTROSE 50 % IV SOLN
INTRAVENOUS | Status: AC
  Administered 2012-09-20: 25 mL
  Filled 2012-09-20: qty 50

## 2012-09-20 MED ORDER — ENOXAPARIN SODIUM 30 MG/0.3ML ~~LOC~~ SOLN
30.0000 mg | SUBCUTANEOUS | Status: DC
  Administered 2012-09-20: 30 mg via SUBCUTANEOUS
  Filled 2012-09-20 (×2): qty 0.3

## 2012-09-20 NOTE — Progress Notes (Signed)
Hypoglycemic Event  CBG: 57  Treatment: D50 IV 25 mL  Symptoms: None  Follow-up CBG: Time:0823 CBG Result:93  Possible Reasons for Event: Inadequate meal intake - patient NPO  Comments/MD notified: Will text-page MD to notify.     Scherrie Merritts Joy  Remember to initiate Hypoglycemia Order Set & complete

## 2012-09-20 NOTE — Progress Notes (Signed)
Inpatient Diabetes Program Recommendations  AACE/ADA: New Consensus Statement on Inpatient Glycemic Control (2013)  Target Ranges:  Prepandial:   less than 140 mg/dL      Peak postprandial:   less than 180 mg/dL (1-2 hours)      Critically ill patients:  140 - 180 mg/dL   Results for AREYANA, LEONI (MRN [id number]) as of 09/20/2012 13:08  Ref. Range 09/20/2012 01:16 09/20/2012 03:27 09/20/2012 07:42 09/20/2012 08:23 09/20/2012 12:01  Glucose-Capillary Latest Range: 70-99 mg/dL 147 (H) 91 57 (L) 93 71    Inpatient Diabetes Program Recommendations Correction (SSI): May want to decrease Novolog correction to sensitive correction scale Q4H.  Note: Patient has a history of diabetes and takes Lantus 15-18 units QHS and Humalog 2-6 units TID as an outpatient for diabetes management.  Currently, patient is ordered to receive Novolog 0-15 units Q4H for inpatient glycemic control.  Hypoglycemic this morning with fasting glucose of 57 mg/dl.  Noted patient was started on D5 1/2NS @ 75 ml/hr.  Patient has not received any Novolog correction since being admitted.  May want to decrease Novolog correction to sensitive correction scale Q4H.  Will continue to follow.  Thanks, Orlando Penner, RN, MSN, CCRN Diabetes Coordinator Inpatient Diabetes Program [phone] (Team Pager) [phone] (AP office) [phone] [facility] office)

## 2012-09-20 NOTE — Progress Notes (Signed)
[facility] PROGRESS NOTE  Jonquil Stubbe AOZ:[id number] DOB: 1922/09/20 DOA: 09/19/2012 PCP: No PCP Per Patient  Assessment/Plan: 1. Ileus versus partial small bowel obstruction 2. Colonic obstruction: Secondary to apple core malignant lesion in transverse colon. Progression of metastatic disease within the liver seen. 3. Presumed colon cancer with metastases: In the past patient and family declined surgery twice. On last admission 08/2012 family declined further evaluation, biopsy, chemotherapy and went home with hospice. 4. Diabetes mellitus type 2: Currently hypoglycemic secondary to n.p.o. status. Dextrose infusion. 5. Severe multivessel coronary artery disease and multivessel disease throughout the visceral vasculature : Asymptomatic. This was seen on CT. 6. History of stroke treated with TPA 2013 with residual left-sided weakness as well as CABG and multiple stents   Discussed with Dr. Deborah Chalk recommended, family is considering. Defer management to partial small bowel obstruction/ileus/colonic obstruction to surgery.  Dextrose infusion, monitor CBG  Resume metoprolol IV  Discussed in detail with son/HCPOA at bedside, we reviewed imaging studies and findings. He reports that she has been doing well in hospice up until the last few days. Discussed treatment options including conservative management versus surgical intervention. He understands that any surgery would be very high risk given the patient's severe multivessel atherosclerosis, coronary disease and history of stroke--risk of stroke, MI or death would be high. He will discuss with his mother to make final determination. He understands presumed diagnosis of cancer with metastatic disease to the liver.  Pending studies:   none  Code Status: DNR DVT prophylaxis: Lovenox Family Communication: as above Disposition Plan: As above  [name], MD  [facility]  Pager [phone] If 7PM-7AM, please contact  night-coverage at [email or web address], password [facility] 09/20/2012, 10:20 AM  LOS: 1 day   Summary: 79 year old woman with history of abnormal CT of the abdomen and pelvis with an apple core lesion suggesting partial colonic obstruction, worrisome for malignancy initially hospitalized 05/2012. Surgery was recommended at that time the patient and family declined inpatient was discharged home. Was admitted again 08/2012 4 suspected metastatic colon cancer, diarrhea and infectious colitis. CT at that time was suspicious for hepatic metastases, primary colonic neoplasm. Patient was seen in consultation with general surgery, family declined surgical intervention, tissue diagnosis or chemotherapy and patient was discharged with hospice. She was admitted 9/16 for colonic obstruction secondary to presumed colon cancer with metastases.  Consultants:  General surgery  Procedures:    Antibiotics:    HPI/Subjective: No pain currently. Pain well controlled with IV narcotics. No complaints currently.  Objective: Filed Vitals:   09/20/12 0521 09/20/12 0631 09/20/12 0700 09/20/12 0929  BP: 190/61 171/50    Pulse:      Temp:      TempSrc:      Resp:      Height:    5\' 3"  (1.6 m)  Weight:    53.2 kg (117 lb 4.6 oz)  SpO2:   100%     Intake/Output Summary (Last 24 hours) at 09/20/12 1020 Last data filed at 09/20/12 0800  Gross per 24 hour  Intake      0 ml  Output      0 ml  Net      0 ml     Filed Weights   09/19/12 2143 09/20/12 0929  Weight: 52.753 kg (116 lb 4.8 oz) 53.2 kg (117 lb 4.6 oz)    Exam:   Afebrile, vitals stable.   general: Appears calm, comfortable. NG tube in place.   Psychiatric: Mildly confused (secondary to  recent narcotics.).  Cardiovascular: Regular rate and rhythm. No murmur, rub, gallop. No lower extremity edema.  Respiratory: Clear to auscultation bilaterally. No wheezes, rales, rhonchi. Normal respiratory effort.  Abdomen: Positive bowel sounds. Soft,  nondistended.  Skin: Bilateral lower extremities appear unremarkable. Grossly normal movement bilateral lower extremities.  Data Reviewed:  Complete metabolic panel notable for mild elevation of creatinine.   CBC with stable anemia.  Hypoglycemic this morning  CT of the abdomen and pelvis noted  Scheduled Meds: . [START ON 09/25/2012] cloNIDine  0.2 mg Transdermal Weekly  . enoxaparin (LOVENOX) injection  40 mg Subcutaneous Q24H  . insulin aspart  0-15 Units Subcutaneous Q4H   Continuous Infusions: . dextrose 5 % and 0.45% NaCl 75 mL/hr at 09/20/12 0930    Principal Problem:   Colonic obstruction Active Problems:   Coronary artery disease   Diabetes mellitus   S/P placement of cardiac pacemaker   Colonic mass   Liver metastases   Time spent 25 minutes

## 2012-09-20 NOTE — Progress Notes (Signed)
Hypoglycemic Event  CBG: 68  Treatment: D50 IV 25 mL  Symptoms: None  Follow-up CBG: Time:1735 CBG Result:106  Possible Reasons for Event: Inadequate meal intake - Patient is NPO.   Comments/MD notified: Notified Dr. Irene Limbo. No new orders at this time. Earnstine Regal, RN    Scherrie Merritts Joy  Remember to initiate Hypoglycemia Order Set & complete

## 2012-09-20 NOTE — Consult Note (Signed)
Reason for Consult: Colonic stricture Referring Physician: [facility]  Cindy Abbott is an 79 y.o. female.  HPI: Patient was brought to the emergency room by her son after increasing nausea and an episode of emesis last night. She complains of diffuse abdominal pain. She has a known history of a colon mass with known distal colonic stricture. She's not had bowel function the last couple days. It has been recommended in the past for a diverting loop colostomy. Patient is currently under hospice care for her nonoperative cancer history. Nausea is currently improved with the nasogastric tube the  Past Medical History  Diagnosis Date  . Coronary artery disease   . Diabetes mellitus   . Pacemaker   . Asthma   . Stroke, acute, thrombotic 08/19/2011    Unknown when in the past but she has received TPA for a CVA  . Anemia   . Colon polyps     Followed by GI at [facility]. h/o numerous polyps 2008 with large one that could not be removed endoscopically. patient opted against surgical resection. yearly colonoscopies until 2011. previously on coumadin but was held due to slow GI bleed related to large polyp  . A-fib     previously on coumadin but withheld due to chronic GI bleed  . Cancer 2014    colon    Past Surgical History  Procedure Laterality Date  . Cardiac surgery    . Coronary angioplasty with stent placement    . Coronary artery bypass graft    . Abdominal hysterectomy      Family History  Problem Relation Age of Onset  . Colon cancer Neg Hx   . Diabetes Son     Social History:  reports that she has never smoked. She does not have any smokeless tobacco history on file. She reports that she does not drink alcohol or use illicit drugs.  Allergies:  Allergies  Allergen Reactions  . Lisinopril Anaphylaxis and Swelling    Medications:  I have reviewed the patient's current medications. Prior to Admission:  Prescriptions prior to admission  Medication Sig Dispense Refill  .  aspirin 325 MG tablet Take 325 mg by mouth every morning.       . bumetanide (BUMEX) 1 MG tablet Take 1 mg by mouth daily as needed (for fluid retention).      . cloNIDine (CATAPRES - DOSED IN MG/24 HR) 0.2 mg/24hr patch Place 1 patch onto the skin once a week.       . docusate sodium (COLACE) 100 MG capsule Take 100 mg by mouth daily.       Marland Kitchen HYDROcodone-acetaminophen (NORCO/VICODIN) 5-325 MG per tablet Take 0.5 tablets by mouth every 6 (six) hours as needed for pain.      Marland Kitchen insulin glargine (LANTUS) 100 UNIT/ML injection Inject 15-18 Units into the skin at bedtime.      . insulin lispro (HUMALOG) 100 UNIT/ML injection Inject 2-6 Units into the skin 3 (three) times daily before meals. Per sliding scale      . ipratropium-albuterol (DUONEB) 0.5-2.5 (3) MG/3ML SOLN Take 3 mLs by nebulization every 6 (six) hours as needed.      . isosorbide mononitrate (IMDUR) 30 MG 24 hr tablet Take 30 mg by mouth daily.      . metoprolol (LOPRESSOR) 50 MG tablet Take 50 mg by mouth 2 (two) times daily.      . nitroGLYCERIN (NITROSTAT) 0.4 MG SL tablet Place 0.4 mg under the tongue every 5 (five) minutes  as needed for chest pain.      . potassium chloride SA (K-DUR,KLOR-CON) 20 MEQ tablet Take 20 mEq by mouth daily.       Scheduled: . [START ON 09/25/2012] cloNIDine  0.2 mg Transdermal Weekly  . enoxaparin (LOVENOX) injection  30 mg Subcutaneous Q24H  . insulin aspart  0-15 Units Subcutaneous Q4H  . metoprolol  2.5 mg Intravenous Q6H   Continuous: . dextrose 5 % and 0.45% NaCl 75 mL/hr at 09/20/12 0930   ZOX:[id number], acetaminophen, albuterol, dextrose, hydrALAZINE, morphine injection, ondansetron (ZOFRAN) IV, ondansetron Anti-infectives   None      Results for orders placed during the hospital encounter of 09/19/12 (from the past 48 hour(s))  CBC WITH DIFFERENTIAL     Status: Abnormal   Collection Time    09/19/12  5:03 PM      Result Value Range   WBC 6.0  4.0 - 10.5 K/uL   RBC 3.82 (*) 3.87 -  5.11 MIL/uL   Hemoglobin 9.6 (*) 12.0 - 15.0 g/dL   HCT 14.7 (*) 82.9 - 56.2 %   MCV 78.5  78.0 - 100.0 fL   MCH 25.1 (*) 26.0 - 34.0 pg   MCHC 32.0  30.0 - 36.0 g/dL   RDW 13.0 (*) 86.5 - 78.4 %   Platelets 308  150 - 400 K/uL   Neutrophils Relative % 82 (*) 43 - 77 %   Neutro Abs 4.9  1.7 - 7.7 K/uL   Lymphocytes Relative 11 (*) 12 - 46 %   Lymphs Abs 0.7  0.7 - 4.0 K/uL   Monocytes Relative 7  3 - 12 %   Monocytes Absolute 0.4  0.1 - 1.0 K/uL   Eosinophils Relative 0  0 - 5 %   Eosinophils Absolute 0.0  0.0 - 0.7 K/uL   Basophils Relative 0  0 - 1 %   Basophils Absolute 0.0  0.0 - 0.1 K/uL  BASIC METABOLIC PANEL     Status: Abnormal   Collection Time    09/19/12  5:03 PM      Result Value Range   Sodium 136  135 - 145 mEq/L   Potassium 4.4  3.5 - 5.1 mEq/L   Chloride 100  96 - 112 mEq/L   CO2 28  19 - 32 mEq/L   Glucose, Bld 159 (*) 70 - 99 mg/dL   BUN 17  6 - 23 mg/dL   Creatinine, Ser 6.96 (*) 0.50 - 1.10 mg/dL   Calcium 29.5  8.4 - 28.4 mg/dL   GFR calc non Af Amer 40 (*) >90 mL/min   GFR calc Af Amer 46 (*) >90 mL/min   Comment: (NOTE)     The eGFR has been calculated using the CKD EPI equation.     This calculation has not been validated in all clinical situations.     eGFR's persistently <90 mL/min signify possible Chronic Kidney     Disease.  HEPATIC FUNCTION PANEL     Status: Abnormal   Collection Time    09/19/12  5:03 PM      Result Value Range   Total Protein 6.9  6.0 - 8.3 g/dL   Albumin 3.1 (*) 3.5 - 5.2 g/dL   AST 18  0 - 37 U/L   ALT <5  0 - 35 U/L   Alkaline Phosphatase 61  39 - 117 U/L   Total Bilirubin 0.7  0.3 - 1.2 mg/dL   Bilirubin, Direct 0.2  0.0 - 0.3  mg/dL   Indirect Bilirubin 0.5  0.3 - 0.9 mg/dL  GLUCOSE, CAPILLARY     Status: Abnormal   Collection Time    09/20/12  1:16 AM      Result Value Range   Glucose-Capillary 108 (*) 70 - 99 mg/dL   Comment 1 Notify RN     Comment 2 Documented in Chart    GLUCOSE, CAPILLARY     Status: None    Collection Time    09/20/12  3:27 AM      Result Value Range   Glucose-Capillary 91  70 - 99 mg/dL  COMPREHENSIVE METABOLIC PANEL     Status: Abnormal   Collection Time    09/20/12  5:46 AM      Result Value Range   Sodium 140  135 - 145 mEq/L   Potassium 4.6  3.5 - 5.1 mEq/L   Chloride 106  96 - 112 mEq/L   CO2 27  19 - 32 mEq/L   Glucose, Bld 68 (*) 70 - 99 mg/dL   BUN 17  6 - 23 mg/dL   Creatinine, Ser 2.13 (*) 0.50 - 1.10 mg/dL   Calcium 9.2  8.4 - 08.6 mg/dL   Total Protein 6.0  6.0 - 8.3 g/dL   Albumin 2.7 (*) 3.5 - 5.2 g/dL   AST 17  0 - 37 U/L   ALT <5  0 - 35 U/L   Alkaline Phosphatase 55  39 - 117 U/L   Total Bilirubin 0.6  0.3 - 1.2 mg/dL   GFR calc non Af Amer 43 (*) >90 mL/min   GFR calc Af Amer 50 (*) >90 mL/min   Comment: (NOTE)     The eGFR has been calculated using the CKD EPI equation.     This calculation has not been validated in all clinical situations.     eGFR's persistently <90 mL/min signify possible Chronic Kidney     Disease.  CBC     Status: Abnormal   Collection Time    09/20/12  5:46 AM      Result Value Range   WBC 4.4  4.0 - 10.5 K/uL   RBC 3.70 (*) 3.87 - 5.11 MIL/uL   Hemoglobin 9.2 (*) 12.0 - 15.0 g/dL   HCT 57.8 (*) 46.9 - 62.9 %   MCV 78.9  78.0 - 100.0 fL   MCH 24.9 (*) 26.0 - 34.0 pg   MCHC 31.5  30.0 - 36.0 g/dL   RDW 52.8 (*) 41.3 - 24.4 %   Platelets 264  150 - 400 K/uL  GLUCOSE, CAPILLARY     Status: None   Collection Time    09/20/12  8:23 AM      Result Value Range   Glucose-Capillary 93  70 - 99 mg/dL   Comment 1 Notify RN      Ct Abdomen Pelvis W Contrast  09/19/2012   CLINICAL DATA:  Abdominal pain, vomiting  EXAM: CT ABDOMEN AND PELVIS WITH CONTRAST  TECHNIQUE: Multidetector CT imaging of the abdomen and pelvis was performed using the standard protocol following bolus administration of intravenous contrast.  CONTRAST:  OMNIPAQUE IOHEXOL 300 MG/ML  SOLN  COMPARISON:  CT scan 08/10/2012  FINDINGS: Cardiomegaly  is noted. Sagittal images of the spine shows diffuse osteopenia. No destructive bony lesions are noted.  There is perihepatic ascites. There is progression in size and number of liver metastatic disease. The largest lesion in the right hepatic dome measures 4.2 cm increased in size  from prior exam. There are additional at least 2 lesions in right hepatic dome. The largest lesion in left hepatic lobe anteriorly measures 4.4 cm. Stable from prior exam.  Atherosclerotic calcifications of the abdominal aorta, splenic artery, hepatic artery SMA and iliac arteries again noted. No evidence of aortic aneurysm.  The pancreas, spleen and adrenal glands are unremarkable. There are multiple distended small bowel loops throughout the abdomen with multiple air-fluid levels consistent with significant ileus or partial small bowel obstruction. Mild distended gallbladder. Mild pericholecystic fluid. No calcified gallstones are noted within gallbladder. There is significant distension of the right colon and transverse colon with fluid. There is a obstructing apple core malignant lesion in distal transverse colon best seen in axial image 22 measures 3.6 cm in length by 2.3 cm. Findings are consistent with colonic obstruction.  The left colon and sigmoid colon is collapsed with small caliber. Some residual stool noted within distal sigmoid colon and rectum.  Enhanced kidneys are symmetrical in size. No hydronephrosis or hydroureter. Mild bilateral cortical thinning probable due to atrophy.  Delayed renal images shows bilateral renal symmetrical excretion. Bilateral visualized proximal ureter is unremarkable.  IMPRESSION: 1. There are multiple distended small bowel loops throughout the abdomen with multiple air-fluid levels consistent with significant ileus or partial small bowel obstruction. Mild distended gallbladder. Mild pericholecystic fluid. No calcified gallstones are noted within gallbladder. There is significant distension of  the right colon and transverse colon with fluid. There is a obstructing apple core malignant lesion in distal transverse colon best seen in axial image 22 measures 3.6 cm in length by 2.3 cm. Findings are consistent with colonic obstruction.  2. No hydronephrosis or hydroureter. 3. Small amount of perihepatic ascites. 4. Progression of metastatic disease within the liver.  CriticalValue/emergent results were called by telephone at the time of interpretation on 09/19/2012 at 7:36 PMto JOSEPH ZAMMIT , who verbally acknowledged these results.   Electronically Signed   By: Natasha Mead   On: 09/19/2012 19:37    Review of Systems  Constitutional: Positive for malaise/fatigue. Negative for fever.  HENT: Negative.   Eyes: Negative.   Respiratory: Negative.   Cardiovascular: Negative for chest pain.  Gastrointestinal: Positive for nausea, vomiting, abdominal pain (diffuse) and constipation. Negative for diarrhea, blood in stool and melena.  Genitourinary: Negative.   Musculoskeletal: Negative.   Skin: Negative.   Neurological: Positive for weakness.  Endo/Heme/Allergies: Negative.   Psychiatric/Behavioral: Negative.    Blood pressure 171/50, pulse 61, temperature 97.9 F (36.6 C), temperature source Oral, resp. rate 15, height 5\' 3"  (1.6 m), weight 53.2 kg (117 lb 4.6 oz), SpO2 100.00%. Physical Exam  Constitutional: She appears well-developed. No distress.  Elderly  HENT:  Head: Normocephalic.  Neck: Normal range of motion. Neck supple.  Cardiovascular: Normal rate.   Respiratory: Effort normal and breath sounds normal.  GI: Soft. She exhibits distension. She exhibits no mass. There is tenderness (moderate diffuse tenderness). There is no rebound and no guarding.  Lymphadenopathy:    She has no cervical adenopathy.  Neurological: She is alert.  Skin: Skin is warm and dry.    Assessment/Plan: Colonic obstruction. Options discussed with family today. Advised to proceed with diverting loop  colostomy for palliation. At this point they expressed need to continue to discuss options with the entire family prior to proceeding. At this point she'll be continued on IV fluid hydration. Pain control. Nasogastric decompression. N.p.o. status. Dr. Lovell Sheehan or myself will be available should patient and family decided to  proceed with diverting loop colostomy.  Cimberly Stoffel C 09/20/2012, 10:30 AM

## 2012-09-20 NOTE — Progress Notes (Signed)
UR chart review completed.  

## 2012-09-20 NOTE — Progress Notes (Signed)
09/20/12 1248 Patient found to have removed NG tube this afternoon per nurse tech. Notified Dr. Irene Limbo, okay to reinsert. 12 french NG tube inserted to right nare per Jessy Oto, RN with nursing supervision. Pt tolerated well, placement confirmed by auscultation. Low intermittent suction as ordered, bile drainage returned. Earnstine Regal, RN

## 2012-09-20 NOTE — Progress Notes (Signed)
09/20/12 1319 Notified Dr. Irene Limbo of patient HR in 50s and BP 15134. Okay to hold metoprolol 2.5 mg IV dose as ordered for HR less than 60. Dose held. Nursing to monitor. Morrie Sheldon Gearald Stonebraker,RN

## 2012-09-21 ENCOUNTER — Inpatient Hospital Stay (HOSPITAL_COMMUNITY)

## 2012-09-21 LAB — CBC
HCT: 26.5 % — ABNORMAL LOW (ref 36.0–46.0)
MCH: 25.4 pg — ABNORMAL LOW (ref 26.0–34.0)
MCV: 79.3 fL (ref 78.0–100.0)
RBC: 3.34 MIL/uL — ABNORMAL LOW (ref 3.87–5.11)
WBC: 3.9 10*3/uL — ABNORMAL LOW (ref 4.0–10.5)

## 2012-09-21 LAB — GLUCOSE, CAPILLARY
Glucose-Capillary: 73 mg/dL (ref 70–99)
Glucose-Capillary: 85 mg/dL (ref 70–99)

## 2012-09-21 LAB — BASIC METABOLIC PANEL
CO2: 29 mEq/L (ref 19–32)
Chloride: 105 mEq/L (ref 96–112)
Creatinine, Ser: 1.17 mg/dL — ABNORMAL HIGH (ref 0.50–1.10)
Glucose, Bld: 91 mg/dL (ref 70–99)

## 2012-09-21 MED ORDER — HYDRALAZINE HCL 20 MG/ML IJ SOLN
10.0000 mg | INTRAMUSCULAR | Status: DC | PRN
  Administered 2012-09-21 – 2012-09-24 (×5): 10 mg via INTRAVENOUS
  Filled 2012-09-21 (×5): qty 1

## 2012-09-21 MED ORDER — METOPROLOL TARTRATE 1 MG/ML IV SOLN
2.5000 mg | Freq: Four times a day (QID) | INTRAVENOUS | Status: DC
  Administered 2012-09-21 – 2012-09-24 (×10): 2.5 mg via INTRAVENOUS
  Filled 2012-09-21 (×10): qty 5

## 2012-09-21 MED ORDER — SODIUM CHLORIDE 0.9 % IV SOLN
1.0000 g | INTRAVENOUS | Status: AC
  Administered 2012-09-22: 1 g via INTRAVENOUS
  Filled 2012-09-21: qty 1

## 2012-09-21 MED ORDER — METOPROLOL TARTRATE 1 MG/ML IV SOLN: 5.0000 mg | Freq: Four times a day (QID) | INTRAVENOUS | Status: DC

## 2012-09-21 MED ORDER — CHLORHEXIDINE GLUCONATE 4 % EX LIQD
1.0000 "application " | Freq: Once | CUTANEOUS | Status: AC
  Administered 2012-09-21: 1 via TOPICAL
  Filled 2012-09-21: qty 15

## 2012-09-21 NOTE — Progress Notes (Signed)
[facility] PROGRESS NOTE  Cindy Abbott ZOX:[id number] DOB: May 27, 1922 DOA: 09/19/2012 PCP: No PCP Per Patient  Assessment/Plan: 1. Ileus versus partial small bowel obstruction: Further management per surgery. 2. Colonic obstruction: Secondary to apple core malignant lesion in transverse colon. Progression of metastatic disease within the liver seen. 3. Presumed colon cancer with metastases: In the past patient and family declined surgery twice. On last admission 08/2012 family declined further evaluation, biopsy, chemotherapy and went home with hospice. 4. Diabetes mellitus type 2: Hypoglycemia stable now on dextrose infusion.  5. Severe multivessel coronary artery disease and multivessel disease throughout the visceral vasculature : Asymptomatic. This was seen on CT on previous admission. 6. History of stroke treated with TPA 2013 with residual left-sided weakness as well as CABG and multiple stents  Son has discussed with his mother and both want to pursue surgery. He understands the high-risk nature of surgery and possible complications including stroke, MI or death.   Continue Dextrose infusion, monitor CBG  TED hose  Pending studies:   none  Code Status: DNR DVT prophylaxis: TED hose Family Communication: as above Disposition Plan: As above  [name], MD  [facility]  Pager [phone] If 7PM-7AM, please contact night-coverage at [email or web address], password [facility] 09/21/2012, 12:15 PM  LOS: 2 days   Summary: 79 year old woman with history of abnormal CT of the abdomen and pelvis with an apple core lesion suggesting partial colonic obstruction, worrisome for malignancy initially hospitalized 05/2012. Surgery was recommended at that time the patient and family declined inpatient was discharged home. Was admitted again 08/2012 4 suspected metastatic colon cancer, diarrhea and infectious colitis. CT at that time was suspicious for hepatic metastases, primary colonic neoplasm.  Patient was seen in consultation with general surgery, family declined surgical intervention, tissue diagnosis or chemotherapy and patient was discharged with hospice. She was admitted 9/16 for colonic obstruction secondary to presumed colon cancer with metastases.  Consultants:  General surgery  Procedures:    Antibiotics:    HPI/Subjective: Has some abdominal pain.  Objective: Filed Vitals:   09/20/12 1801 09/20/12 2354 09/21/12 0300 09/21/12 0512  BP: 146/52 155/45 147/34 126/43  Pulse: 60 62 59   Temp: 98.5 F (36.9 C)  98.7 F (37.1 C)   TempSrc: Oral  Oral   Resp: 16  17   Height:      Weight:      SpO2: 99%  98%     Intake/Output Summary (Last 24 hours) at 09/21/12 1215 Last data filed at 09/21/12 0429  Gross per 24 hour  Intake  637.5 ml  Output    800 ml  Net -162.5 ml     Filed Weights   09/19/12 2143 09/20/12 0929  Weight: 52.753 kg (116 lb 4.8 oz) 53.2 kg (117 lb 4.6 oz)    Exam:   Afebrile, vitals stable. NG tube in place.  General: Appears calm, moderately uncomfortable. Nontoxic.  Psychiatric: Mildly confused  Cardiovascular: Regular rate and rhythm. No murmur, rub, gallop. No lower extremity edema.  Respiratory: Clear to auscultation bilaterally. No wheezes, rales, rhonchi. Normal respiratory effort.  Abdomen: Positive bowel sounds but diminished. Soft, nondistended, generalized tenderness.  Skin: Bilateral lower extremities appear unremarkable.   Data Reviewed:  Several episodes of hypoglycemia noted, last 9/17 at 1652  Basic metabolic panel unremarkable.  Hemoglobin stable 8.5.  Scheduled Meds: . antiseptic oral rinse  15 mL Mouth Rinse q12n4p  . chlorhexidine  15 mL Mouth Rinse BID  . [START ON 09/25/2012] cloNIDine  0.2  mg Transdermal Weekly  . enoxaparin (LOVENOX) injection  30 mg Subcutaneous Q24H  . metoprolol  2.5 mg Intravenous Q6H   Continuous Infusions: . dextrose 5 % and 0.45% NaCl 75 mL/hr at 09/21/12 1114     Principal Problem:   Colonic obstruction Active Problems:   Coronary artery disease   Diabetes mellitus   S/P placement of cardiac pacemaker   Colonic mass   Liver metastases   Time spent 20 minutes

## 2012-09-21 NOTE — Care Management Note (Signed)
    Page 1 of 1   09/25/2012     1:24:38 PM   CARE MANAGEMENT NOTE 09/25/2012  Patient:  Cindy Abbott, Cindy Abbott   Account Number:  [id number]  Date Initiated:  09/21/2012  Documentation initiated by:  Rosemary Holms  Subjective/Objective Assessment:   Pt lives at home with [facility]. CM spoke to Henrene Dodge at [facility]. Per Diannia Ruder will assist with DC planning. Per [facility] RNCM, Dr. Andrey Campanile at [facility] would like to pain and narcotics. Dr. Irene Limbo notified.     Action/Plan:   Anticipated DC Date:  09/25/2012   Anticipated DC Plan:  HOME W HOSPICE CARE      DC Planning Services  CM consult      Choice offered to / List presented to:             [facility] agency  [facility]   Status of service:  Completed, signed off Medicare Important Message given?  YES (If response is "NO", the following Medicare IM given date fields will be blank) Date Medicare IM given:  09/25/2012 Date Additional Medicare IM given:    Discharge Disposition:  HOME W HOSPICE CARE  Per UR Regulation:    If discussed at Long Length of Stay Meetings, dates discussed:    Comments:  09/25/12 Rosemary Holms RN BSN CM Ostomy RN met with Son. [employer] [place] with [facility] here to speak with pt, son and ostomy RN. Supplies to be given to pt for DC.  09/22/12 Rosemary Holms RN BSN CM Pt to return home with [facility] [facility]. DC summary should be sent to Hospice.  09/21/12 Flay Ghosh Leanord Hawking RN BSN CNM

## 2012-09-21 NOTE — Progress Notes (Signed)
Patient's BP 211/49. Dr. Karilyn Cota notified.

## 2012-09-21 NOTE — Progress Notes (Signed)
Patient and son have elected to proceed with a palliative transverse loop colostomy for her obstructive metastatic colon cancer. They realize that this is only for palliation. She has unresectable colon cancer. The risks and benefits of the procedure including bleeding, infection, cardiopulmonary difficulties, and death were fully explained to the patient and son, who gave informed consent. She will receive 2 units of packed red blood cells prior to surgery. Her overall prognosis is poor.

## 2012-09-21 NOTE — Progress Notes (Signed)
Patient BP 206/66, PRN Hydralazine given. Dr. Irene Limbo notified.

## 2012-09-22 ENCOUNTER — Inpatient Hospital Stay (HOSPITAL_COMMUNITY): Admitting: Anesthesiology

## 2012-09-22 ENCOUNTER — Inpatient Hospital Stay (HOSPITAL_COMMUNITY)

## 2012-09-22 ENCOUNTER — Encounter (HOSPITAL_COMMUNITY): Payer: Self-pay | Admitting: *Deleted

## 2012-09-22 ENCOUNTER — Encounter (HOSPITAL_COMMUNITY): Payer: Self-pay | Admitting: Anesthesiology

## 2012-09-22 ENCOUNTER — Encounter (HOSPITAL_COMMUNITY)
Admission: EM | Disposition: A | Discharge status: Hospice | Payer: Self-pay | Source: Home / Self Care | Attending: Family Medicine

## 2012-09-22 DIAGNOSIS — I509 Heart failure, unspecified: Secondary | ICD-10-CM

## 2012-09-22 DIAGNOSIS — C189 Malignant neoplasm of colon, unspecified: Secondary | ICD-10-CM | POA: Diagnosis present

## 2012-09-22 HISTORY — PX: COLOSTOMY: SHX63

## 2012-09-22 HISTORY — PX: CENTRAL VENOUS CATHETER INSERTION: SHX401

## 2012-09-22 LAB — GLUCOSE, CAPILLARY
Glucose-Capillary: 119 mg/dL — ABNORMAL HIGH (ref 70–99)
Glucose-Capillary: 138 mg/dL — ABNORMAL HIGH (ref 70–99)
Glucose-Capillary: 162 mg/dL — ABNORMAL HIGH (ref 70–99)

## 2012-09-22 LAB — CBC
MCH: 27.2 pg (ref 26.0–34.0)
MCV: 81.7 fL (ref 78.0–100.0)
Platelets: 262 10*3/uL (ref 150–400)
RBC: 4.71 MIL/uL (ref 3.87–5.11)

## 2012-09-22 LAB — BASIC METABOLIC PANEL
BUN: 16 mg/dL (ref 6–23)
CO2: 22 mEq/L (ref 19–32)
Calcium: 8.9 mg/dL (ref 8.4–10.5)
Glucose, Bld: 123 mg/dL — ABNORMAL HIGH (ref 70–99)
Sodium: 136 mEq/L (ref 135–145)

## 2012-09-22 SURGERY — CREATION, COLOSTOMY
Anesthesia: General | Site: Chest | Laterality: Right | Wound class: Clean Contaminated

## 2012-09-22 MED ORDER — NEOSTIGMINE METHYLSULFATE 1 MG/ML IJ SOLN
INTRAMUSCULAR | Status: DC | PRN
  Administered 2012-09-22: 2 mg via INTRAVENOUS

## 2012-09-22 MED ORDER — MIDAZOLAM HCL 2 MG/2ML IJ SOLN
INTRAMUSCULAR | Status: AC
  Filled 2012-09-22: qty 2

## 2012-09-22 MED ORDER — ROCURONIUM BROMIDE 100 MG/10ML IV SOLN
INTRAVENOUS | Status: DC | PRN
  Administered 2012-09-22: 5 mg via INTRAVENOUS
  Administered 2012-09-22: 25 mg via INTRAVENOUS

## 2012-09-22 MED ORDER — 0.9 % SODIUM CHLORIDE (POUR BTL) OPTIME
TOPICAL | Status: DC | PRN
  Administered 2012-09-22 (×2): 1000 mL

## 2012-09-22 MED ORDER — POVIDONE-IODINE 10 % EX OINT
TOPICAL_OINTMENT | CUTANEOUS | Status: AC
  Filled 2012-09-22: qty 2

## 2012-09-22 MED ORDER — FENTANYL CITRATE 0.05 MG/ML IJ SOLN
INTRAMUSCULAR | Status: DC | PRN
  Administered 2012-09-22 (×4): 25 ug via INTRAVENOUS

## 2012-09-22 MED ORDER — LACTATED RINGERS IV SOLN
INTRAVENOUS | Status: DC
  Administered 2012-09-22: 11:00:00 via INTRAVENOUS

## 2012-09-22 MED ORDER — ETOMIDATE 2 MG/ML IV SOLN
INTRAVENOUS | Status: AC
  Filled 2012-09-22: qty 10

## 2012-09-22 MED ORDER — FENTANYL CITRATE 0.05 MG/ML IJ SOLN: 25.0000 ug | INTRAMUSCULAR | Status: DC | PRN

## 2012-09-22 MED ORDER — ONDANSETRON HCL 4 MG/2ML IJ SOLN: 4.0000 mg | Freq: Once | INTRAMUSCULAR | Status: DC | PRN

## 2012-09-22 MED ORDER — SODIUM CHLORIDE 0.9 % IV SOLN
INTRAVENOUS | Status: AC
  Filled 2012-09-22: qty 1

## 2012-09-22 MED ORDER — GLYCOPYRROLATE 0.2 MG/ML IJ SOLN
INTRAMUSCULAR | Status: AC
  Filled 2012-09-22: qty 2

## 2012-09-22 MED ORDER — MIDAZOLAM HCL 5 MG/5ML IJ SOLN
INTRAMUSCULAR | Status: DC | PRN
  Administered 2012-09-22: 1 mg via INTRAVENOUS
  Administered 2012-09-22: 0.5 mg via INTRAVENOUS

## 2012-09-22 MED ORDER — EPHEDRINE SULFATE 50 MG/ML IJ SOLN
INTRAMUSCULAR | Status: DC | PRN
  Administered 2012-09-22 (×3): 5 mg via INTRAVENOUS

## 2012-09-22 MED ORDER — FENTANYL CITRATE 0.05 MG/ML IJ SOLN
INTRAMUSCULAR | Status: AC
  Filled 2012-09-22: qty 2

## 2012-09-22 MED ORDER — SODIUM CHLORIDE 0.9 % IJ SOLN
INTRAMUSCULAR | Status: AC
  Filled 2012-09-22: qty 10

## 2012-09-22 MED ORDER — ETOMIDATE 2 MG/ML IV SOLN
INTRAVENOUS | Status: DC | PRN
  Administered 2012-09-22: 14 mg via INTRAVENOUS

## 2012-09-22 MED ORDER — ENOXAPARIN SODIUM 40 MG/0.4ML ~~LOC~~ SOLN
40.0000 mg | SUBCUTANEOUS | Status: DC
  Administered 2012-09-22 – 2012-09-24 (×3): 40 mg via SUBCUTANEOUS
  Filled 2012-09-22 (×3): qty 0.4

## 2012-09-22 MED ORDER — EPHEDRINE SULFATE 50 MG/ML IJ SOLN
INTRAMUSCULAR | Status: AC
  Filled 2012-09-22: qty 1

## 2012-09-22 MED ORDER — ROCURONIUM BROMIDE 50 MG/5ML IV SOLN
INTRAVENOUS | Status: AC
  Filled 2012-09-22: qty 1

## 2012-09-22 MED ORDER — SUCCINYLCHOLINE CHLORIDE 20 MG/ML IJ SOLN
INTRAMUSCULAR | Status: DC | PRN
  Administered 2012-09-22: 100 mg via INTRAVENOUS

## 2012-09-22 MED ORDER — MIDAZOLAM HCL 2 MG/2ML IJ SOLN: 1.0000 mg | INTRAMUSCULAR | Status: DC | PRN

## 2012-09-22 MED ORDER — GLYCOPYRROLATE 0.2 MG/ML IJ SOLN
INTRAMUSCULAR | Status: DC | PRN
  Administered 2012-09-22: 0.4 mg via INTRAVENOUS

## 2012-09-22 MED ORDER — ONDANSETRON HCL 4 MG/2ML IJ SOLN: 4.0000 mg | Freq: Once | INTRAMUSCULAR | Status: DC

## 2012-09-22 MED ORDER — SUCCINYLCHOLINE CHLORIDE 20 MG/ML IJ SOLN
INTRAMUSCULAR | Status: AC
  Filled 2012-09-22: qty 1

## 2012-09-22 SURGICAL SUPPLY — 36 items
BAG HAMPER (MISCELLANEOUS) ×3 IMPLANT
BAG UROSTOMY 4 LOOP 90 STRL (OSTOMY) ×3 IMPLANT
BENZOIN TINCTURE PRP APPL 2/3 (GAUZE/BANDAGES/DRESSINGS) ×3 IMPLANT
CHLORAPREP W/TINT 10.5 ML (MISCELLANEOUS) ×3 IMPLANT
CLOTH BEACON ORANGE TIMEOUT ST (SAFETY) ×3 IMPLANT
COVER LIGHT HANDLE STERIS (MISCELLANEOUS) ×6 IMPLANT
DRAIN PENROSE 18X1/2 LTX STRL (DRAIN) ×3 IMPLANT
DRAPE WARM FLUID 44X44 (DRAPE) ×3 IMPLANT
DRSG TEGADERM 4X4.75 (GAUZE/BANDAGES/DRESSINGS) ×3 IMPLANT
DURAPREP 26ML APPLICATOR (WOUND CARE) ×3 IMPLANT
ELECT BLADE 6 FLAT ULTRCLN (ELECTRODE) ×3 IMPLANT
ELECT REM PT RETURN 9FT ADLT (ELECTROSURGICAL) ×3
ELECTRODE REM PT RTRN 9FT ADLT (ELECTROSURGICAL) ×2 IMPLANT
GLOVE BIO SURGEON STRL SZ7.5 (GLOVE) ×6 IMPLANT
GLOVE BIOGEL PI IND STRL 7.0 (GLOVE) ×4 IMPLANT
GLOVE BIOGEL PI IND STRL 7.5 (GLOVE) ×4 IMPLANT
GLOVE BIOGEL PI INDICATOR 7.0 (GLOVE) ×2
GLOVE BIOGEL PI INDICATOR 7.5 (GLOVE) ×2
GLOVE ECLIPSE 6.5 STRL STRAW (GLOVE) ×6 IMPLANT
GLOVE ECLIPSE 7.0 STRL STRAW (GLOVE) ×6 IMPLANT
GLOVE EXAM NITRILE LRG STRL (GLOVE) ×3 IMPLANT
GOWN STRL REIN XL XLG (GOWN DISPOSABLE) ×18 IMPLANT
INST SET MAJOR GENERAL (KITS) ×3 IMPLANT
KIT CV MULTI LUMEN 7FRX20CM (SET/KITS/TRAYS/PACK) ×3 IMPLANT
KIT ROOM TURNOVER APOR (KITS) ×3 IMPLANT
LIGASURE IMPACT 36 18CM CVD LR (INSTRUMENTS) ×3 IMPLANT
MANIFOLD NEPTUNE II (INSTRUMENTS) ×3 IMPLANT
NS IRRIG 1000ML POUR BTL (IV SOLUTION) ×6 IMPLANT
PACK ABDOMINAL MAJOR (CUSTOM PROCEDURE TRAY) ×3 IMPLANT
PAD ARMBOARD 7.5X6 YLW CONV (MISCELLANEOUS) ×3 IMPLANT
SET BASIN LINEN APH (SET/KITS/TRAYS/PACK) ×3 IMPLANT
SPONGE GAUZE 2X2 8PLY STRL LF (GAUZE/BANDAGES/DRESSINGS) ×6 IMPLANT
SPONGE GAUZE 4X4 12PLY (GAUZE/BANDAGES/DRESSINGS) ×3 IMPLANT
SPONGE LAP 18X18 X RAY DECT (DISPOSABLE) ×3 IMPLANT
SUCTION POOLE TIP (SUCTIONS) ×3 IMPLANT
SUT PDS AB 0 CTX 60 (SUTURE) ×3 IMPLANT

## 2012-09-22 NOTE — Transfer of Care (Signed)
Immediate Anesthesia Transfer of Care Note  Patient: Cindy Abbott  Procedure(s) Performed: Procedure(s) (LRB): COLOSTOMY (N/A) INSERTION CENTRAL LINE ADULT (Right)  Patient Location: PACU  Anesthesia Type: General  Level of Consciousness: awake  Airway & Oxygen Therapy: Patient Spontanous Breathing and non-rebreather face mask  Post-op Assessment: Report given to PACU RN, Post -op Vital signs reviewed and stable and Patient moving all extremities  Post vital signs: Reviewed and stable  Complications: No apparent anesthesia complications

## 2012-09-22 NOTE — Progress Notes (Signed)
[facility] PROGRESS NOTE  Cindy Abbott HYQ:[id number] DOB: [birth year] DOA: 09/19/2012 PCP: No PCP Per Patient  Assessment/Plan: 1. Colonic obstruction: Secondary to apple core malignant lesion in transverse colon. Progression of metastatic disease within the liver seen. Status post palliative transverse loop colostomy. 2. Presumed colon cancer with metastases: In the past patient and family declined surgery twice. On last admission 08/2012 family declined further evaluation, biopsy, chemotherapy and went home with hospice. 3. Diabetes mellitus type 2: Hypoglycemia stable now on dextrose infusion.  4. Severe multivessel coronary artery disease and multivessel disease throughout the visceral vasculature : Asymptomatic. This was seen on CT on previous admission. 5. History of stroke treated with TPA 2013 with residual left-sided weakness as well as CABG and multiple stents     Continue Dextrose infusion, monitor CBG  TED hose  Pending studies:   none  Code Status: DNR DVT prophylaxis: TED hose Family Communication: Left message with family Disposition Plan: Home with hospice versus hospice facility in a few days  Virginia Rochester, MD  [facility]  Pager [phone] If 7PM-7AM, please contact night-coverage at [email or web address], password [facility] 09/22/2012, 5:16 PM  LOS: 3 days   Summary: 79 year old woman with history of abnormal CT of the abdomen and pelvis with an apple core lesion suggesting partial colonic obstruction, worrisome for malignancy initially hospitalized 05/2012. Surgery was recommended at that time the patient and family declined inpatient was discharged home. Was admitted again 08/2012 4 suspected metastatic colon cancer, diarrhea and infectious colitis. CT at that time was suspicious for hepatic metastases, primary colonic neoplasm. Patient was seen in consultation with general surgery, family declined surgical intervention, tissue diagnosis or chemotherapy and patient  was discharged with hospice. She was admitted 9/16 for colonic obstruction secondary to presumed colon cancer with metastases.  Consultants:  General surgery  Procedures:  Status post transverse loop colostomy  Antibiotics:  None  HPI/Subjective: Patient seen after surgery. Currently resting comfortably  Objective: Filed Vitals:   09/22/12 1300 09/22/12 1318 09/22/12 1353 09/22/12 1542  BP: 216/64 206/62 207/65 199/65  Pulse: 60 60 60 60  Temp: 97.5 F (36.4 C) 97.8 F (36.6 C)  98.2 F (36.8 C)  TempSrc:  Oral  Axillary  Resp: 20 18 18 20   Height:      Weight:      SpO2: 100% 100% 99% 99%    Intake/Output Summary (Last 24 hours) at 09/22/12 1716 Last data filed at 09/22/12 1600  Gross per 24 hour  Intake   2820 ml  Output   1010 ml  Net   1810 ml     Filed Weights   09/19/12 2143 09/20/12 0929  Weight: 52.753 kg (116 lb 4.8 oz) 53.2 kg (117 lb 4.6 oz)    Exam:   General: Asleep, appears comfortable  Cardiovascular: Regular rate and rhythm. No murmur, rub, gallop. No lower extremity edema.  Respiratory: Clear to auscultation bilaterally. No wheezes, rales, rhonchi. Normal respiratory effort.  Abdomen: Bandaged, ostomy in place  Extremities: No clubbing or cyanosis or edema  Data Reviewed:  Several episodes of hypoglycemia noted, last 9/17 at 1652  Basic metabolic panel unremarkable.  Hemoglobin status post transfusion at 12.8  Scheduled Meds: . antiseptic oral rinse  15 mL Mouth Rinse q12n4p  . chlorhexidine  15 mL Mouth Rinse BID  . [START ON 09/25/2012] cloNIDine  0.2 mg Transdermal Weekly  . enoxaparin (LOVENOX) injection  40 mg Subcutaneous Q24H  . metoprolol  2.5 mg Intravenous Q6H   Continuous Infusions: .  dextrose 5 % and 0.45% NaCl 75 mL/hr at 09/22/12 1357    Principal Problem:   Colonic obstruction Active Problems:   Coronary artery disease   Diabetes mellitus   S/P placement of cardiac pacemaker   Colonic mass   Liver  metastases   Time spent 15 minutes

## 2012-09-22 NOTE — Anesthesia Postprocedure Evaluation (Signed)
Anesthesia Post Note  Patient: Cindy Abbott  Procedure(s) Performed: Procedure(s) (LRB): COLOSTOMY (N/A) INSERTION CENTRAL LINE ADULT (Right)  Anesthesia type: General  Patient location: PACU  Post pain: Pain level controlled  Post assessment: Post-op Vital signs reviewed, Patient's Cardiovascular Status Stable, Respiratory Function Stable, Patent Airway, No signs of Nausea or vomiting and Pain level controlled  Last Vitals:  Filed Vitals:   09/22/12 1157  BP: 208/62  Pulse: 72  Temp: 37 C  Resp: 22    Post vital signs: Reviewed and stable  Level of consciousness: awake and alert   Complications: No apparent anesthesia complications

## 2012-09-22 NOTE — Op Note (Signed)
Patient:  Cindy Abbott  DOB:  1922-01-26  MRN:  [id number]   Preop Diagnosis:  Metastatic colon cancer, colon obstruction  Postop Diagnosis:  Same  Procedure:  Transverse loop colostomy, palliation  Central line placement Surgeon:  Franky Macho, M.D.  Anes:  General endotracheal  Indications:  Patient is an 79 year old black female with multiple medical problems who presents with metastatic colon cancer. She has a known mass at the splenic flexure which is now causing obstruction. She is already involved with hospice and now comes the operating room for a palliative transverse loop colostomy. The risks and benefits of the procedure including bleeding, infection, cardiopulmonary difficulties, and death were fully explained to the patient and family, who gave informed consent.  Procedure note:  Do to lack of IV access, the patient was placed in the supine position. The right upper chest was prepped and draped using usual sterile technique with DuraPrep. Surgical site confirmation was performed. 1% Xylocaine was used for local anesthesia. While in Trendelenburg position, a needle is advanced into the right subclavian vein using the Seldinger technique without difficulty. A guidewire was then advanced. An introducer was placed over the guidewire. A triple-lumen catheter was then inserted over the guidewire and the guidewire was removed. The catheter was secured at the 16 cm Cloud Graham. Good backflow was noted from all 3 ports. All ports were flushed with saline. A chest x-ray was performed in the OR and adequate positioning was confirmed. No pneumothorax is noted. We then proceeded with the abdominal portion of the surgery.  The abdomen was prepped and draped using usual sterile technique with DuraPrep. Surgical site confirmation was performed. An upper midline incision was made down to the fascia. The peritoneal cavity was entered into without difficulty. The transverse colon was then mobilized through the  incision. Omentum was freed away from the transverse colon using the LigaSure. A vessel loop was then used to secure the transverse loop exteriorization. The fascia on either side of the colostomy was closed in order to make a better fit using 0 PDS interrupted sutures. A single suture was placed superiorly and another one placed inferiorly. The loop colostomy bar was then placed. The ostomy appliance was placed over the loop colostomy. An incision was then made into the colostomy and its contents evacuated with suction. The ostomy bag was then placed on the wafer.  All tape and needle counts were correct at the end of the procedure. Patient was extubated in the operating room and transferred to PACU in stable condition.  Complications:  None  EBL:  10 cc  Specimen:  None

## 2012-09-22 NOTE — Anesthesia Procedure Notes (Signed)
Procedure Name: Intubation Date/Time: 09/22/2012 10:46 AM Performed by: Franco Nones Pre-anesthesia Checklist: Patient identified, Patient being monitored, Timeout performed, Emergency Drugs available and Suction available Patient Re-evaluated:Patient Re-evaluated prior to inductionOxygen Delivery Method: Circle System Utilized Preoxygenation: Pre-oxygenation with 100% oxygen Intubation Type: IV induction, Rapid sequence and Cricoid Pressure applied Laryngoscope Size: Miller and 2 Grade View: Grade I Tube type: Oral Tube size: 7.0 mm Number of attempts: 1 Airway Equipment and Method: stylet Placement Confirmation: ETT inserted through vocal cords under direct vision,  positive ETCO2 and breath sounds checked- equal and bilateral Secured at: 21 cm Tube secured with: Tape Dental Injury: Teeth and Oropharynx as per pre-operative assessment

## 2012-09-22 NOTE — Anesthesia Preprocedure Evaluation (Signed)
Anesthesia Evaluation  Patient identified by MRN, date of birth, ID band Patient awake    Reviewed: Allergy & Precautions, H&P , NPO status , Patient's Chart, lab work & pertinent test results, reviewed documented beta blocker date and time   Airway Mallampati: III TM Distance: >3 FB Neck ROM: Full    Dental  (+) Edentulous Upper and Edentulous Lower   Pulmonary asthma ,  breath sounds clear to auscultation        Cardiovascular hypertension, + CAD, + Cardiac Stents, + CABG and +CHF + dysrhythmias Atrial Fibrillation + pacemaker Rhythm:Regular Rate:Normal     Neuro/Psych CVA    GI/Hepatic   Endo/Other    Renal/GU      Musculoskeletal   Abdominal   Peds  Hematology   Anesthesia Other Findings   Reproductive/Obstetrics                           Anesthesia Physical Anesthesia Plan  ASA: III  Anesthesia Plan: General   Post-op Pain Management:    Induction: Intravenous, Rapid sequence and Cricoid pressure planned  Airway Management Planned: Oral ETT  Additional Equipment:   Intra-op Plan:   Post-operative Plan: Extubation in OR  Informed Consent: I have reviewed the patients History and Physical, chart, labs and discussed the procedure including the risks, benefits and alternatives for the proposed anesthesia with the patient or authorized representative who has indicated his/her understanding and acceptance.     Plan Discussed with:   Anesthesia Plan Comments: (amidate induction )        Anesthesia Quick Evaluation

## 2012-09-23 DIAGNOSIS — C189 Malignant neoplasm of colon, unspecified: Secondary | ICD-10-CM

## 2012-09-23 DIAGNOSIS — D649 Anemia, unspecified: Secondary | ICD-10-CM

## 2012-09-23 LAB — GLUCOSE, CAPILLARY
Glucose-Capillary: 153 mg/dL — ABNORMAL HIGH (ref 70–99)
Glucose-Capillary: 180 mg/dL — ABNORMAL HIGH (ref 70–99)
Glucose-Capillary: 195 mg/dL — ABNORMAL HIGH (ref 70–99)

## 2012-09-23 LAB — CBC
Hemoglobin: 11.7 g/dL — ABNORMAL LOW (ref 12.0–15.0)
Platelets: 217 10*3/uL (ref 150–400)
RBC: 4.35 MIL/uL (ref 3.87–5.11)
WBC: 4.5 10*3/uL (ref 4.0–10.5)

## 2012-09-23 LAB — BASIC METABOLIC PANEL
GFR calc Af Amer: 64 mL/min — ABNORMAL LOW (ref >90)
GFR calc non Af Amer: 55 mL/min — ABNORMAL LOW (ref >90)
Potassium: 3.7 mEq/L (ref 3.5–5.1)
Sodium: 140 mEq/L (ref 135–145)

## 2012-09-23 MED ORDER — SODIUM CHLORIDE 0.9 % IJ SOLN: 10.0000 mL | INTRAMUSCULAR | Status: DC | PRN

## 2012-09-23 MED ORDER — SODIUM CHLORIDE 0.9 % IJ SOLN
10.0000 mL | Freq: Two times a day (BID) | INTRAMUSCULAR | Status: DC
  Administered 2012-09-23 (×2): 20 mL

## 2012-09-23 NOTE — Progress Notes (Signed)
Pt pulled off colostomy bag along.  Pt cleaned and colostomy redressed.  Placed mitten on pt to deter her from pulling on lines and colostomy bag.

## 2012-09-23 NOTE — Progress Notes (Signed)
[facility] PROGRESS NOTE  Cindy Abbott AVW:[id number] DOB: May 28, 1922 DOA: 09/19/2012 PCP: No PCP Per Patient  Assessment/Plan: 1. Colonic obstruction: Secondary to apple core malignant lesion in transverse colon. Progression of metastatic disease within the liver seen. Status post palliative transverse loop colostomy. Patient pulled off colostomy bag last night but this has been replaced. Enterostomal therapy nurse consulted. 2. Presumed colon cancer with metastases: In the past patient and family declined surgery twice. On last admission 08/2012 family declined further evaluation, biopsy, chemotherapy and went home with hospice. 3. Diabetes mellitus type 2: Hypoglycemia stable now on dextrose infusion.  4. Severe multivessel coronary artery disease and multivessel disease throughout the visceral vasculature : Asymptomatic. This was seen on CT on previous admission. 5. History of stroke treated with TPA 2013 with residual left-sided weakness as well as CABG and multiple stents     Continue Dextrose infusion, monitor CBG  TED hose  Pending studies:   none  Code Status: DNR DVT prophylaxis: TED hose Family Communication: Left message with family yesterday Disposition Plan: Home with hospice versus hospice facility in a few days.  Allegedly, but may be some social issues where son maybe diverting narcotics to himself.  Virginia Rochester, MD  [facility]  Pager [phone] If 7PM-7AM, please contact night-coverage at [email or web address], password [facility] 09/23/2012, 12:42 PM  LOS: 4 days   Summary: 79 year old woman with history of abnormal CT of the abdomen and pelvis with an apple core lesion suggesting partial colonic obstruction, worrisome for malignancy initially hospitalized 05/2012. Surgery was recommended at that time the patient and family declined inpatient was discharged home. Was admitted again 08/2012 4 suspected metastatic colon cancer, diarrhea and infectious colitis. CT at that  time was suspicious for hepatic metastases, primary colonic neoplasm. Patient was seen in consultation with general surgery, family declined surgical intervention, tissue diagnosis or chemotherapy and patient was discharged with hospice. She was admitted 9/16 for colonic obstruction secondary to presumed colon cancer with metastases.  Consultants:  General surgery  Procedures:  Status post transverse loop colostomy  Antibiotics:  None  HPI/Subjective: Patient resting comfortably. Nonverbal.  Objective: Filed Vitals:   09/22/12 2014 09/23/12 0418 09/23/12 1100 09/23/12 1225  BP: 174/37 166/63 170/72   Pulse: 59 60 60   Temp: 98.6 F (37 C) 98.3 F (36.8 C)  98.7 F (37.1 C)  TempSrc: Oral Oral  Oral  Resp: 20 20    Height:      Weight:  57.335 kg (126 lb 6.4 oz)    SpO2: 100% 100%      Intake/Output Summary (Last 24 hours) at 09/23/12 1242 Last data filed at 09/23/12 1223  Gross per 24 hour  Intake    745 ml  Output   1700 ml  Net   -955 ml     Filed Weights   09/19/12 2143 09/20/12 0929 09/23/12 0418  Weight: 52.753 kg (116 lb 4.8 oz) 53.2 kg (117 lb 4.6 oz) 57.335 kg (126 lb 6.4 oz)    Exam:   General: Asleep, appears comfortable, nonverbal  Cardiovascular: Regular rate and rhythm. No murmur, rub, gallop. No lower extremity edema.  Respiratory: Clear to auscultation bilaterally. No wheezes, rales, rhonchi. Normal respiratory effort.  Abdomen: Bandaged, ostomy in place  Extremities: No clubbing or cyanosis or edema  Data Reviewed:  CBG stable  Basic metabolic panel unremarkable.  Hemoglobin status post transfusion at 12.8, dropped down to 11.7 today  Scheduled Meds: . antiseptic oral rinse  15 mL Mouth Rinse q12n4p  .  chlorhexidine  15 mL Mouth Rinse BID  . [START ON 09/25/2012] cloNIDine  0.2 mg Transdermal Weekly  . enoxaparin (LOVENOX) injection  40 mg Subcutaneous Q24H  . metoprolol  2.5 mg Intravenous Q6H  . sodium chloride  10-40 mL  Intracatheter Q12H   Continuous Infusions: . dextrose 5 % and 0.45% NaCl 10 mL/hr at 09/23/12 1219    Principal Problem:   Colonic obstruction Active Problems:   Coronary artery disease   Diabetes mellitus   S/P placement of cardiac pacemaker   Colonic mass   Liver metastases   Metastatic colon cancer to liver   Time spent 15 minutes

## 2012-09-23 NOTE — Progress Notes (Signed)
1 Day Post-Op  Subjective: Patient not communicative. Pulled off her ostomy bag overnight.  Objective: Vital signs in last 24 hours: Temp:  [97.5 F (36.4 C)-98.6 F (37 C)] 98.3 F (36.8 C) (09/20 0418) Pulse Rate:  [59-72] 60 (09/20 0418) Resp:  [18-48] 20 (09/20 0418) BP: (166-216)/(37-65) 166/63 mmHg (09/20 0418) SpO2:  [96 %-100 %] 100 % (09/20 0418) Weight:  [57.335 kg (126 lb 6.4 oz)] 57.335 kg (126 lb 6.4 oz) (09/20 0418) Last BM Date: 09/22/12 (colostomy)  Intake/Output from previous day: 09/19 0701 - 09/20 0700 In: 1000 [I.V.:1000] Out: 1810 [Urine:800; Stool:600; Blood:410] Intake/Output this shift: Total I/O In: 345 [P.O.:345] Out: -   General appearance: appears stated age and no distress GI: Soft. Loop colostomy patent and functioning well. No abdominal distention noted.  Lab Results:   Recent Labs  09/22/12 0606 09/23/12 0618  WBC 5.7 4.5  HGB 12.8 11.7*  HCT 38.5 35.9*  PLT 262 217   BMET  Recent Labs  09/22/12 0606 09/23/12 0618  NA 136 140  K 4.0 3.7  CL 103 105  CO2 22 27  GLUCOSE 123* 194*  BUN 16 14  CREATININE 0.89 0.90  CALCIUM 8.9 8.4   PT/INR No results found for this basename: LABPROT, INR,  in the last 72 hours  Studies/Results: Dg Chest Port 1 View  09/22/2012   *RADIOLOGY REPORT*  Clinical Data: Central line placement, initial encounter.  PORTABLE CHEST - 1 VIEW  Comparison: Earlier same day; 08/10/2012, 01/11/2012  Findings:  Interval placement of right subclavian vein approach central venous catheter with tip overlying the superior cavoatrial junction. Interval placement of enteric tube with tip and sideport projecting over the gastric fundus.  Grossly unchanged enlarged cardiac silhouette and mediastinal contours post median sternotomy and CABG.  Atherosclerotic plaque within the thoracic aorta.  Left anterior chest wall single lead pacemaker, unchanged.  Mild cephalization of flow without frank evidence of edema. Grossly  unchanged mild diffuse nodular thickening of the interstitium.  Left basilar heterogeneous opacities are unchanged. Trace left-sided effusion is not excluded.  No pneumothorax. Unchanged bones.  IMPRESSION: 1.  Appropriately positioned support apparatus as above.  No pneumothorax. 2.  Pulmonary congestion without frank evidence of edema. 3.  Grossly unchanged left basilar airspace opacities, atelectasis versus infiltrate.   Original Report Authenticated By: Tacey Ruiz, MD   Dg Chest Port 1 View  09/21/2012   CLINICAL DATA:  Hypertension  EXAM: PORTABLE CHEST - 1 VIEW  COMPARISON:  08/10/2012  FINDINGS: Cardiomegaly again noted. Status post median sternotomy. There is trace left pleural effusion with left basilar atelectasis or infiltrate. No pulmonary edema. NG tube in place. Single lead cardiac pacemaker is unchanged in position.  IMPRESSION: No pulmonary edema. Trace left pleural effusion with left basilar atelectasis or infiltrate. NG tube in place.   Electronically Signed   By: Natasha Mead   On: 09/21/2012 14:17    Anti-infectives: Anti-infectives   Start     Dose/Rate Route Frequency Ordered Stop   09/22/12 0600  ertapenem (INVANZ) 1 g in sodium chloride 0.9 % 50 mL IVPB     1 g 100 mL/hr over 30 Minutes Intravenous On call to O.R. 09/21/12 2157 09/22/12 1053      Assessment/Plan: s/p Procedure(s): TRANSVERSE LOOP COLOSTOMY INSERTION CENTRAL LINE ADULT Impression: Stable postoperative day 1, status post loop colostomy. It is functioning well. Will advance to regular diet. Enterostomal therapy nurse has been consulted.  LOS: 4 days    [facility]  A 09/23/2012

## 2012-09-24 ENCOUNTER — Inpatient Hospital Stay (HOSPITAL_COMMUNITY)

## 2012-09-24 LAB — GLUCOSE, CAPILLARY
Glucose-Capillary: 149 mg/dL — ABNORMAL HIGH (ref 70–99)
Glucose-Capillary: 135 mg/dL — ABNORMAL HIGH (ref 70–99)
Glucose-Capillary: 163 mg/dL — ABNORMAL HIGH (ref 70–99)
Glucose-Capillary: 140 mg/dL — ABNORMAL HIGH (ref 70–99)

## 2012-09-24 MED ORDER — ISOSORBIDE MONONITRATE ER 60 MG PO TB24
30.0000 mg | ORAL_TABLET | Freq: Every day | ORAL | Status: DC
  Administered 2012-09-24 – 2012-09-25 (×2): 30 mg via ORAL
  Filled 2012-09-24 (×2): qty 1

## 2012-09-24 MED ORDER — ASPIRIN 325 MG PO TABS
325.0000 mg | ORAL_TABLET | Freq: Every morning | ORAL | Status: DC
  Administered 2012-09-24 – 2012-09-25 (×2): 325 mg via ORAL
  Filled 2012-09-24 (×2): qty 1

## 2012-09-24 MED ORDER — MORPHINE SULFATE 2 MG/ML IJ SOLN
2.0000 mg | INTRAMUSCULAR | Status: DC | PRN
  Administered 2012-09-24 – 2012-09-25 (×2): 2 mg via INTRAVENOUS
  Filled 2012-09-24 (×2): qty 1

## 2012-09-24 MED ORDER — NITROGLYCERIN 0.4 MG SL SUBL: 0.4000 mg | SUBLINGUAL_TABLET | SUBLINGUAL | Status: DC | PRN

## 2012-09-24 MED ORDER — METOPROLOL TARTRATE 50 MG PO TABS
50.0000 mg | ORAL_TABLET | Freq: Two times a day (BID) | ORAL | Status: DC
  Administered 2012-09-24 – 2012-09-25 (×3): 50 mg via ORAL
  Filled 2012-09-24 (×3): qty 1

## 2012-09-24 NOTE — Anesthesia Postprocedure Evaluation (Signed)
  Anesthesia Post-op Note  Patient: Cindy Abbott  Procedure(s) Performed: Procedure(s): TRANSVERSE LOOP COLOSTOMY (N/A) INSERTION CENTRAL LINE ADULT (Right)  Patient Location:Room 321  Anesthesia Type:General  Level of Consciousness: confused and lethargic  Airway and Oxygen Therapy: Patient Spontanous Breathing and Patient connected to nasal cannula oxygen  Post-op Pain: mild  Post-op Assessment: Post-op Vital signs reviewed, Patient's Cardiovascular Status Stable, Respiratory Function Stable, Patent Airway, No signs of Nausea or vomiting and Pain level controlled  Post-op Vital Signs: Reviewed and stable  Complications: No apparent anesthesia complications

## 2012-09-24 NOTE — Progress Notes (Signed)
2 Days Post-Op  Subjective: Patient noncommunicative.  Objective: Vital signs in last 24 hours: Temp:  [97.9 F (36.6 C)-98.7 F (37.1 C)] 97.9 F (36.6 C) (09/21 0654) Pulse Rate:  [57-60] 59 (09/21 0654) Resp:  [14-20] 14 (09/21 0654) BP: (158-188)/(64-87) 158/87 mmHg (09/21 0654) SpO2:  [98 %-100 %] 99 % (09/21 0654) Last BM Date: 09/23/12  Intake/Output from previous day: 09/20 0701 - 09/21 0700 In: 745 [P.O.:745] Out: 975 [Urine:150; Stool:625; Blood:200] Intake/Output this shift:    GI: Soft, colostomy patent. Functioning well.  Lab Results:   Recent Labs  09/22/12 0606 09/23/12 0618  WBC 5.7 4.5  HGB 12.8 11.7*  HCT 38.5 35.9*  PLT 262 217   BMET  Recent Labs  09/22/12 0606 09/23/12 0618  NA 136 140  K 4.0 3.7  CL 103 105  CO2 22 27  GLUCOSE 123* 194*  BUN 16 14  CREATININE 0.89 0.90  CALCIUM 8.9 8.4   PT/INR No results found for this basename: LABPROT, INR,  in the last 72 hours  Studies/Results: Dg Chest Port 1 View  09/22/2012   *RADIOLOGY REPORT*  Clinical Data: Central line placement, initial encounter.  PORTABLE CHEST - 1 VIEW  Comparison: Earlier same day; 08/10/2012, 01/11/2012  Findings:  Interval placement of right subclavian vein approach central venous catheter with tip overlying the superior cavoatrial junction. Interval placement of enteric tube with tip and sideport projecting over the gastric fundus.  Grossly unchanged enlarged cardiac silhouette and mediastinal contours post median sternotomy and CABG.  Atherosclerotic plaque within the thoracic aorta.  Left anterior chest wall single lead pacemaker, unchanged.  Mild cephalization of flow without frank evidence of edema. Grossly unchanged mild diffuse nodular thickening of the interstitium.  Left basilar heterogeneous opacities are unchanged. Trace left-sided effusion is not excluded.  No pneumothorax. Unchanged bones.  IMPRESSION: 1.  Appropriately positioned support apparatus as above.   No pneumothorax. 2.  Pulmonary congestion without frank evidence of edema. 3.  Grossly unchanged left basilar airspace opacities, atelectasis versus infiltrate.   Original Report Authenticated By: Tacey Ruiz, MD    Anti-infectives: Anti-infectives   Start     Dose/Rate Route Frequency Ordered Stop   09/22/12 0600  ertapenem (INVANZ) 1 g in sodium chloride 0.9 % 50 mL IVPB     1 g 100 mL/hr over 30 Minutes Intravenous On call to O.R. 09/21/12 2157 09/22/12 1053      Assessment/Plan: s/p Procedure(s): TRANSVERSE LOOP COLOSTOMY INSERTION CENTRAL LINE ADULT Impression: Stable, status post palliative transverse loop colostomy. Plan: Patient is stable from a surgical standpoint for placement. Enterostomal therapy nurse has been consulted.  LOS: 5 days    Harman Ferrin A 09/24/2012

## 2012-09-24 NOTE — Progress Notes (Signed)
Right subclavian central line removed due to malposition. Pt placed in trendelenburg and catheter removed and Vaseline guaze placed. Pressure held for 5 minutes. Catheter tip intact upon removal. Pt tolerated well. Occlusive dressing placed.

## 2012-09-24 NOTE — Progress Notes (Signed)
[facility] PROGRESS NOTE  Allyne Hebert ZOX:[id number] DOB: 04/29/1922 DOA: 09/19/2012 PCP: No PCP Per Patient  Assessment/Plan: 1. Colonic obstruction: Status post palliative transverse loop colostomy. Secondary to apple core malignant lesion in transverse colon. Progression of metastatic disease within the liver seen on CT.  2. Ileus versus partial small bowel obstruction: Appears resolved. Management deferred to general surgery. 3. Presumed colon cancer with metastases: In the past patient and family declined surgery twice. On last admission 08/2012 family declined further evaluation, biopsy, chemotherapy and went home with hospice. 4. Diabetes mellitus type 2: Hypoglycemia has resolved.  5. Severe multivessel coronary artery disease and multivessel disease throughout the visceral vasculature: Asymptomatic. This was seen on CT on previous admission. 6. History of stroke treated with TPA 2013 with residual left-sided weakness as well as CABG and multiple stents   Discontinue telemetry  Discussed with Dr. Lovell Sheehan: Appears to be improving. Will discuss with family home with hospice versus residential hospice.  Pending studies:   none  Code Status: DNR DVT prophylaxis: TED hose Family Communication: None present Disposition Plan: As above  [name], MD  [facility]  Pager [phone] If 7PM-7AM, please contact night-coverage at [email or web address], password [facility] 09/24/2012, 9:42 AM  LOS: 5 days   Summary: 79 year old woman with history of abnormal CT of the abdomen and pelvis with an apple core lesion suggesting partial colonic obstruction, worrisome for malignancy initially hospitalized 05/2012. Surgery was recommended at that time the patient and family declined inpatient was discharged home. Was admitted again 08/2012 4 suspected metastatic colon cancer, diarrhea and infectious colitis. CT at that time was suspicious for hepatic metastases, primary colonic neoplasm. Patient was  seen in consultation with general surgery, family declined surgical intervention, tissue diagnosis or chemotherapy and patient was discharged with hospice. She was admitted 9/16 for colonic obstruction secondary to presumed colon cancer with metastases  Consultants:  General surgery  Procedures:  9/19 palliative transverse loop colostomy  9/19 Right subclavian central line  Antibiotics:    HPI/Subjective: He feels okay. She denies pain. No complaints offered.  Objective: Filed Vitals:   09/23/12 2235 09/23/12 2250 09/24/12 0200 09/24/12 0654  BP: 188/64  164/64 158/87  Pulse:  57 60 59  Temp: 98 F (36.7 C)  97.9 F (36.6 C) 97.9 F (36.6 C)  TempSrc: Oral  Oral Oral  Resp:   16 14  Height:      Weight:      SpO2:  98% 100% 99%    Intake/Output Summary (Last 24 hours) at 09/24/12 0942 Last data filed at 09/24/12 0204  Gross per 24 hour  Intake    400 ml  Output    975 ml  Net   -575 ml     Filed Weights   09/19/12 2143 09/20/12 0929 09/23/12 0418  Weight: 52.753 kg (116 lb 4.8 oz) 53.2 kg (117 lb 4.6 oz) 57.335 kg (126 lb 6.4 oz)    Exam:   Afebrile, vitals stable.   General: Appears calm, mildly confused. Comfortable in appearance.  Cardiovascular: Regular rate and rhythm. No murmur, rub, gallop. No lower extremity edema.  Telemetry: paced rhythm.  Respiratory: Clear to auscultation bilaterally. No wheezes, rales, rhonchi. Normal respiratory effort.  Skin: Appears grossly unremarkable. Both feet are warm and dry.   Psychiatric: Appears somewhat confused but she follows commands.  Data Reviewed:  No hypoglycemia last 48 hours.  No other laboratory studies  Scheduled Meds: . antiseptic oral rinse  15 mL Mouth Rinse q12n4p  .  chlorhexidine  15 mL Mouth Rinse BID  . [START ON 09/25/2012] cloNIDine  0.2 mg Transdermal Weekly  . enoxaparin (LOVENOX) injection  40 mg Subcutaneous Q24H  . metoprolol  2.5 mg Intravenous Q6H  . sodium chloride   10-40 mL Intracatheter Q12H   Continuous Infusions: . dextrose 5 % and 0.45% NaCl 10 mL/hr at 09/23/12 1219    Principal Problem:   Colonic obstruction Active Problems:   Coronary artery disease   Diabetes mellitus   S/P placement of cardiac pacemaker   Colonic mass   Liver metastases   Metastatic colon cancer to liver   Time spent 15 minutes

## 2012-09-25 ENCOUNTER — Encounter (HOSPITAL_COMMUNITY): Payer: Self-pay | Admitting: General Surgery

## 2012-09-25 LAB — TYPE AND SCREEN
ABO/RH(D): O POS
Antibody Screen: NEGATIVE
Unit division: 0
Unit division: 0
Unit division: 0
Unit division: 0

## 2012-09-25 LAB — GLUCOSE, CAPILLARY: Glucose-Capillary: 163 mg/dL — ABNORMAL HIGH (ref 70–99)

## 2012-09-25 MED ORDER — OXYCODONE HCL 5 MG PO TABS: 5.0000 mg | ORAL_TABLET | Freq: Four times a day (QID) | ORAL | Status: DC | PRN

## 2012-09-25 NOTE — Progress Notes (Signed)
The son is taking patient home. She is cleared from my standpoint for discharge. Enterostomal therapy nurse will be in today to instruct son on care of loop colostomy. Hospice will continue to follow her at home.

## 2012-09-25 NOTE — Consult Note (Addendum)
Ostomy follow-up: Supplies arrived from [place] with courier. Demonstrated pouch application to son and Hospice nurse using large opening 2 piece pouch and proper technique with rod to maintain seal.  Stoma red and viable, above skin level, 31/2 inches.  Educational materials left at bedside with 4 pouching sets for supplies.  Placed on [place] discharge program.  Son denies further questions. Please re-consult if further assistance is needed.  Thank-you,  Cammie Mcgee MSN, RN, CWOCN, [place], CNS [phone]

## 2012-09-25 NOTE — Discharge Summary (Signed)
Physician Discharge Summary  Cindy Abbott ZOX:[id number] DOB: 1922/02/06 DOA: 09/19/2012  PCP: [name], MD  Admit date: 09/19/2012 Discharge date: 09/25/2012  Recommendations for Outpatient Follow-up:  1. Continue/resume hospice 2. Wound care, ostomy care   Discharge Diagnoses:  1. Colonic obstruction 2. Presumed colon cancer with metastasis 3. Ileus versus small bowel obstruction 4. Diabetes mellitus type 2 5. Severe multivessel coronary artery disease and multivessel disease throughout the visceral vasculature, asymptomatic  Discharge Condition: Improved Disposition: Home with hospice  Diet recommendation: Resume usual diet. Can use dysphagia 3.  Filed Weights   09/20/12 0929 09/23/12 0418 09/25/12 0403  Weight: 53.2 kg (117 lb 4.6 oz) 57.335 kg (126 lb 6.4 oz) 58.06 kg (128 lb)    History of present illness:  79 year old woman with history of abnormal CT of the abdomen and pelvis with an apple core lesion suggesting partial colonic obstruction, worrisome for malignancy initially hospitalized 05/2012. Surgery was recommended at that time the patient and family declined inpatient was discharged home. Was admitted again 08/2012 for suspected metastatic colon cancer, diarrhea and infectious colitis. CT at that time was suspicious for hepatic metastases, primary colonic neoplasm. Patient was seen in consultation with general surgery, family declined surgical intervention, tissue diagnosis or chemotherapy and patient was discharged with hospice. She was admitted 9/16 for colonic obstruction secondary to presumed colon cancer with metastases  Hospital Course:  [name] was seen in consultation with surgery for colonic obstruction secondary to presumed colon cancer with metastatic disease to the liver. Palliative transverse loop colostomy was recommended and after deliberation, patient and son proceed. Procedure went without complication and patient has done remarkably well  postoperatively. She is eating and has minimal pain. Son will receive education on ostomy care and requests to be able to take his mother home today. He wants to resume hospice. No further intervention desired at this point by family. Individual issues as below.  1. Colonic obstruction: Doing very well status post palliative transverse loop colostomy. Secondary to apple core malignant lesion in transverse colon. Progression of metastatic disease within the liver seen on CT.  2. Ileus versus partial small bowel obstruction: Resolved. Presumably secondary to colonic obstruction. 3. Presumed colon cancer with metastases: On last admission 08/2012 family declined further evaluation, biopsy, chemotherapy and went home with hospice. Plan resume hospice on discharge. 4. Diabetes mellitus type 2: Hypoglycemia has resolved. Resume insulin as diet improves.  5. Severe multivessel coronary artery disease and multivessel disease throughout the visceral vasculature: Remains asymptomatic. This was seen on CT on previous admission. 6. History of stroke treated with TPA 2013 with residual left-sided weakness as well as CABG and multiple stents  Consultants:  General surgery Procedures:  9/19 palliative transverse loop colostomy  Transfusion packed red blood cells pre-operatively  9/19 Right subclavian central line  Discharge Instructions  Discharge Orders   Future Orders Complete By Expires   Diet - low sodium heart healthy  As directed    Discharge instructions  As directed    Comments:     Resume hospice. Wound care as instructed by Dr. Lovell Sheehan office. Call physician or seek immediate medical attention for fever, uncontrolled pain or worsening of condition. Resume Humalog short acting insulin at meals as directed by primary care physician. Monitor blood sugars closely. Resume Lantus once blood sugars increased, suggest in consultation with primary care physician. As you know, your mother has presumed colon  cancer with spread to the liver which has worsened since first discovered in May.  As you have expressed, continue with pain control and attention to quality of life.   Increase activity slowly  As directed        Medication List    STOP taking these medications       HYDROcodone-acetaminophen 5-325 MG per tablet  Commonly known as:  NORCO/VICODIN     insulin glargine 100 UNIT/ML injection  Commonly known as:  LANTUS      TAKE these medications       aspirin 325 MG tablet  Take 325 mg by mouth every morning.     bumetanide 1 MG tablet  Commonly known as:  BUMEX  Take 1 mg by mouth daily as needed (for fluid retention).     cloNIDine 0.2 mg/24hr patch  Commonly known as:  CATAPRES - Dosed in mg/24 hr  Place 1 patch onto the skin once a week.     docusate sodium 100 MG capsule  Commonly known as:  COLACE  Take 100 mg by mouth daily.     insulin lispro 100 UNIT/ML injection  Commonly known as:  HUMALOG  Inject 2-6 Units into the skin 3 (three) times daily before meals. Per sliding scale     ipratropium-albuterol 0.5-2.5 (3) MG/3ML Soln  Commonly known as:  DUONEB  Take 3 mLs by nebulization every 6 (six) hours as needed.     isosorbide mononitrate 30 MG 24 hr tablet  Commonly known as:  IMDUR  Take 30 mg by mouth daily.     metoprolol 50 MG tablet  Commonly known as:  LOPRESSOR  Take 50 mg by mouth 2 (two) times daily.     nitroGLYCERIN 0.4 MG SL tablet  Commonly known as:  NITROSTAT  Place 0.4 mg under the tongue every 5 (five) minutes as needed for chest pain.     oxyCODONE 5 MG immediate release tablet  Commonly known as:  Oxy IR/ROXICODONE  Take 1 tablet (5 mg total) by mouth every 6 (six) hours as needed.     potassium chloride SA 20 MEQ tablet  Commonly known as:  K-DUR,KLOR-CON  Take 20 mEq by mouth daily.       Allergies  Allergen Reactions  . Lisinopril Anaphylaxis and Swelling    The results of significant diagnostics from this hospitalization  (including imaging, microbiology, ancillary and laboratory) are listed below for reference.    Significant Diagnostic Studies: Ct Abdomen Pelvis W Contrast  09/19/2012   CLINICAL DATA:  Abdominal pain, vomiting  EXAM: CT ABDOMEN AND PELVIS WITH CONTRAST  TECHNIQUE: Multidetector CT imaging of the abdomen and pelvis was performed using the standard protocol following bolus administration of intravenous contrast.  CONTRAST:  OMNIPAQUE IOHEXOL 300 MG/ML  SOLN  COMPARISON:  CT scan 08/10/2012  FINDINGS: Cardiomegaly is noted. Sagittal images of the spine shows diffuse osteopenia. No destructive bony lesions are noted.  There is perihepatic ascites. There is progression in size and number of liver metastatic disease. The largest lesion in the right hepatic dome measures 4.2 cm increased in size from prior exam. There are additional at least 2 lesions in right hepatic dome. The largest lesion in left hepatic lobe anteriorly measures 4.4 cm. Stable from prior exam.  Atherosclerotic calcifications of the abdominal aorta, splenic artery, hepatic artery SMA and iliac arteries again noted. No evidence of aortic aneurysm.  The pancreas, spleen and adrenal glands are unremarkable. There are multiple distended small bowel loops throughout the abdomen with multiple air-fluid levels consistent with significant ileus or partial  small bowel obstruction. Mild distended gallbladder. Mild pericholecystic fluid. No calcified gallstones are noted within gallbladder. There is significant distension of the right colon and transverse colon with fluid. There is a obstructing apple core malignant lesion in distal transverse colon best seen in axial image 22 measures 3.6 cm in length by 2.3 cm. Findings are consistent with colonic obstruction.  The left colon and sigmoid colon is collapsed with small caliber. Some residual stool noted within distal sigmoid colon and rectum.  Enhanced kidneys are symmetrical in size. No hydronephrosis  or hydroureter. Mild bilateral cortical thinning probable due to atrophy.  Delayed renal images shows bilateral renal symmetrical excretion. Bilateral visualized proximal ureter is unremarkable.  IMPRESSION: 1. There are multiple distended small bowel loops throughout the abdomen with multiple air-fluid levels consistent with significant ileus or partial small bowel obstruction. Mild distended gallbladder. Mild pericholecystic fluid. No calcified gallstones are noted within gallbladder. There is significant distension of the right colon and transverse colon with fluid. There is a obstructing apple core malignant lesion in distal transverse colon best seen in axial image 22 measures 3.6 cm in length by 2.3 cm. Findings are consistent with colonic obstruction.  2. No hydronephrosis or hydroureter. 3. Small amount of perihepatic ascites. 4. Progression of metastatic disease within the liver.  CriticalValue/emergent results were called by telephone at the time of interpretation on 09/19/2012 at 7:36 PMto JOSEPH ZAMMIT , who verbally acknowledged these results.   Electronically Signed   By: Natasha Mead   On: 09/19/2012 19:37   Dg Chest Port 1 View  09/22/2012   *RADIOLOGY REPORT*  Clinical Data: Central line placement, initial encounter.  PORTABLE CHEST - 1 VIEW  Comparison: Earlier same day; 08/10/2012, 01/11/2012  Findings:  Interval placement of right subclavian vein approach central venous catheter with tip overlying the superior cavoatrial junction. Interval placement of enteric tube with tip and sideport projecting over the gastric fundus.  Grossly unchanged enlarged cardiac silhouette and mediastinal contours post median sternotomy and CABG.  Atherosclerotic plaque within the thoracic aorta.  Left anterior chest wall single lead pacemaker, unchanged.  Mild cephalization of flow without frank evidence of edema. Grossly unchanged mild diffuse nodular thickening of the interstitium.  Left basilar heterogeneous  opacities are unchanged. Trace left-sided effusion is not excluded.  No pneumothorax. Unchanged bones.  IMPRESSION: 1.  Appropriately positioned support apparatus as above.  No pneumothorax. 2.  Pulmonary congestion without frank evidence of edema. 3.  Grossly unchanged left basilar airspace opacities, atelectasis versus infiltrate.   Original Report Authenticated By: Tacey Ruiz, MD   Dg Chest Port 1 View  09/21/2012   CLINICAL DATA:  Hypertension  EXAM: PORTABLE CHEST - 1 VIEW  COMPARISON:  08/10/2012  FINDINGS: Cardiomegaly again noted. Status post median sternotomy. There is trace left pleural effusion with left basilar atelectasis or infiltrate. No pulmonary edema. NG tube in place. Single lead cardiac pacemaker is unchanged in position.  IMPRESSION: No pulmonary edema. Trace left pleural effusion with left basilar atelectasis or infiltrate. NG tube in place.   Electronically Signed   By: Natasha Mead   On: 09/21/2012 14:17    Microbiology: Recent Results (from the past 240 hour(s))  SURGICAL PCR SCREEN     Status: None   Collection Time    09/22/12 12:16 AM      Result Value Range Status   MRSA, PCR NEGATIVE  NEGATIVE Final   Staphylococcus aureus NEGATIVE  NEGATIVE Final   Comment:  The Xpert SA Assay (FDA     approved for NASAL specimens     in patients over 92 years of age),     is one component of     a comprehensive surveillance     program.  Test performance has     been validated by [employer] for patients greater     than or equal to 11 year old.     It is not intended     to diagnose infection nor to     guide or monitor treatment.     Labs: Basic Metabolic Panel:  Recent Labs Lab 09/19/12 1703 09/20/12 0546 09/21/12 0836 09/22/12 0606 09/23/12 0618  NA 136 140 140 136 140  K 4.4 4.6 4.5 4.0 3.7  CL 100 106 105 103 105  CO2 28 27 29 22 27   GLUCOSE 159* 68* 91 123* 194*  BUN 17 17 18 16 14   CREATININE 1.19* 1.11* 1.17* 0.89 0.90  CALCIUM 10.0  9.2 9.1 8.9 8.4   Liver Function Tests:  Recent Labs Lab 09/19/12 1703 09/20/12 0546  AST 18 17  ALT <5 <5  ALKPHOS 61 55  BILITOT 0.7 0.6  PROT 6.9 6.0  ALBUMIN 3.1* 2.7*   CBC:  Recent Labs Lab 09/19/12 1703 09/20/12 0546 09/21/12 0836 09/22/12 0606 09/23/12 0618  WBC 6.0 4.4 3.9* 5.7 4.5  NEUTROABS 4.9  --   --   --   --   HGB 9.6* 9.2* 8.5* 12.8 11.7*  HCT 30.0* 29.2* 26.5* 38.5 35.9*  MCV 78.5 78.9 79.3 81.7 82.5  PLT 308 264 263 262 217   BG:  Recent Labs Lab 09/24/12 1614 09/24/12 2050 09/25/12 09/25/12 0619 09/25/12 0733  GLUCAP 140* 159* 169* 163* 152*    Principal Problem:   Colonic obstruction Active Problems:   Coronary artery disease   Diabetes mellitus   S/P placement of cardiac pacemaker   Colonic mass   Liver metastases   Metastatic colon cancer to liver   Time coordinating discharge: 35 minutes  Signed:  [name], MD [facility] 09/25/2012, 10:20 AM

## 2012-09-25 NOTE — Consult Note (Signed)
WOC ostomy consult  Stoma type/location: Stoma to right lower quad; current pouch is leaking R/T very large stoma size.  Attempted to locate a large pouch in the supply room and the OR.  There are no more available at [facility].  Store room has called courier at the [place] supply location to drive up a box of supplies for this patient.  She is waiting to discharge to home today with Hospice assistance.  Demonstrated pouch application to son while awaiting supplies to be delivered.  He is able to open and close velcro to empty.  He states his sister and the patient's Granddaughter are both nursing assistants and are familiar with pouching routines and application. Stomal assessment/size: 3 1/2 inches.  ROD INTACT TO STOMA which will need to be removed in 7-10 days.  This creates difficult pouching situation to maintain seal. Peristomal assessment: Intact skin surrounding. Output  Large amt liquid [place] stool. Ostomy pouching: 2pc. Pouch with large opening Education provided: Discussed stoma pouching needs and rod with Hospice nursing team and son.Will apply pouch as soon as supplies are delivered. Cammie Mcgee MSN, RN, CWOCN, [place], CNS [phone]

## 2012-09-25 NOTE — Progress Notes (Signed)
1430 - AVS reviewed with patient's son.  Prescription provided to patient's son earlier so that he could go and fill the medication.  Pt's son verbalized understanding of discharge and follow-up instructions.  Pt educated by [facility] nurse about ostomy care.  Verbalized understanding.  Pt's foley catheter removed prior to discharge.  Pt's son educated that if his mother has not voided within 8 hours to come to the ED.  Pt's IV removed.  Site WNL.  Pt transported by NT via w/c to main entrance for discharge.  Hospice to resume care at discharge.

## 2012-09-25 NOTE — Progress Notes (Signed)
[facility] PROGRESS NOTE  Cindy Abbott ZOX:[id number] DOB: 1922/09/26 DOA: 09/19/2012 PCP: [name], MD  Assessment/Plan: 1. Colonic obstruction: Doing very well status post palliative transverse loop colostomy. Secondary to apple core malignant lesion in transverse colon. Progression of metastatic disease within the liver seen on CT.  2. Ileus versus partial small bowel obstruction: Resolved. Presumably secondary to colonic obstruction. 3. Presumed colon cancer with metastases: On last admission 08/2012 family declined further evaluation, biopsy, chemotherapy and went home with hospice. Plan resume hospice on discharge. 4. Diabetes mellitus type 2: Hypoglycemia has resolved. Resume insulin as diet improves.  5. Severe multivessel coronary artery disease and multivessel disease throughout the visceral vasculature: Remains asymptomatic. This was seen on CT on previous admission. 6. History of stroke treated with TPA 2013 with residual left-sided weakness as well as CABG and multiple stents  Discussed with son at bedside. We discussed resuming her insulin and pain medication as needed. He plans to resume hospice for his mother on discharge. He understands that she has metastatic cancer and does not desire further evaluation or intervention at this point.   Wean oxygen  Discussed with Dr. Lovell Sheehan -- clear for discharge. Wound ostomy nurse education this morning.  Discharge home today  Pending studies:   none  Code Status: DNR DVT prophylaxis: TED hose Family Communication: None present Disposition Plan: As above  [name], MD  [facility]  Pager [phone] If 7PM-7AM, please contact night-coverage at [email or web address], password [facility] 09/25/2012, 9:15 AM  LOS: 6 days   Summary: 79 year old woman with history of abnormal CT of the abdomen and pelvis with an apple core lesion suggesting partial colonic obstruction, worrisome for malignancy initially hospitalized 05/2012.  Surgery was recommended at that time the patient and family declined inpatient was discharged home. Was admitted again 08/2012 4 suspected metastatic colon cancer, diarrhea and infectious colitis. CT at that time was suspicious for hepatic metastases, primary colonic neoplasm. Patient was seen in consultation with general surgery, family declined surgical intervention, tissue diagnosis or chemotherapy and patient was discharged with hospice. She was admitted 9/16 for colonic obstruction secondary to presumed colon cancer with metastases  Consultants:  General surgery  Procedures:  9/19 palliative transverse loop colostomy  Transfusion packed red blood cells  9/19 Right subclavian central line  Antibiotics:    HPI/Subjective: No pain. No vomiting. Eating well. The patient denies complaints. Son at bedside. He is quite pleased with his mother's improvement. No issues overnight.  Objective: Filed Vitals:   09/24/12 1234 09/24/12 1334 09/24/12 2047 09/25/12 0403  BP: 175/61 102/65 120/41 121/42  Pulse: 66  59 59  Temp:   98.2 F (36.8 C) 97.8 F (36.6 C)  TempSrc:   Oral Oral  Resp:   16 16  Height:      Weight:    58.06 kg (128 lb)  SpO2:   100% 100%    Intake/Output Summary (Last 24 hours) at 09/25/12 0915 Last data filed at 09/25/12 0404  Gross per 24 hour  Intake 3854.25 ml  Output   1325 ml  Net 2529.25 ml     Filed Weights   09/20/12 0929 09/23/12 0418 09/25/12 0403  Weight: 53.2 kg (117 lb 4.6 oz) 57.335 kg (126 lb 6.4 oz) 58.06 kg (128 lb)    Exam:   Afebrile, vitals stable.   General: Appears calm and comfortable. Well-appearing.  Cardiovascular: Regular rate and rhythm. No murmur, rub, gallop. No lower extremity edema. Feet warm and dry.  Respiratory: Clear  to auscultation bilaterally. No wheezes, rales, rhonchi. Normal respiratory effort.  Abdomen: Soft, nontender, nondistended. Ostomy bag in place.  Psychiatric: Grossly normal mood and affect.  Follows commands. Speech fluent and appropriate. Alert. Interacts appropriately.  Data Reviewed:  Capillary blood sugars stable.   Scheduled Meds: . antiseptic oral rinse  15 mL Mouth Rinse q12n4p  . aspirin  325 mg Oral q morning - 10a  . chlorhexidine  15 mL Mouth Rinse BID  . cloNIDine  0.2 mg Transdermal Weekly  . enoxaparin (LOVENOX) injection  40 mg Subcutaneous Q24H  . isosorbide mononitrate  30 mg Oral Daily  . metoprolol  50 mg Oral BID  . sodium chloride  10-40 mL Intracatheter Q12H   Continuous Infusions: . dextrose 5 % and 0.45% NaCl 10 mL/hr at 09/24/12 1122    Principal Problem:   Colonic obstruction Active Problems:   Coronary artery disease   Diabetes mellitus   S/P placement of cardiac pacemaker   Colonic mass   Liver metastases   Metastatic colon cancer to liver

## 2012-10-04 ENCOUNTER — Emergency Department (HOSPITAL_COMMUNITY)

## 2012-10-04 ENCOUNTER — Encounter (HOSPITAL_COMMUNITY): Payer: Self-pay | Admitting: Emergency Medicine

## 2012-10-04 ENCOUNTER — Emergency Department (HOSPITAL_COMMUNITY)
Admission: EM | Admit: 2012-10-04 | Discharge: 2012-10-04 | Disposition: A | Discharge status: Home / Self Care | Attending: Emergency Medicine | Admitting: Emergency Medicine

## 2012-10-04 DIAGNOSIS — J45909 Unspecified asthma, uncomplicated: Secondary | ICD-10-CM | POA: Insufficient documentation

## 2012-10-04 DIAGNOSIS — Z7982 Long term (current) use of aspirin: Secondary | ICD-10-CM | POA: Insufficient documentation

## 2012-10-04 DIAGNOSIS — I2510 Atherosclerotic heart disease of native coronary artery without angina pectoris: Secondary | ICD-10-CM | POA: Insufficient documentation

## 2012-10-04 DIAGNOSIS — Z860100 Personal history of colon polyps, unspecified: Secondary | ICD-10-CM | POA: Insufficient documentation

## 2012-10-04 DIAGNOSIS — E119 Type 2 diabetes mellitus without complications: Secondary | ICD-10-CM | POA: Insufficient documentation

## 2012-10-04 DIAGNOSIS — R011 Cardiac murmur, unspecified: Secondary | ICD-10-CM | POA: Insufficient documentation

## 2012-10-04 DIAGNOSIS — Z85038 Personal history of other malignant neoplasm of large intestine: Secondary | ICD-10-CM | POA: Insufficient documentation

## 2012-10-04 DIAGNOSIS — Z951 Presence of aortocoronary bypass graft: Secondary | ICD-10-CM | POA: Insufficient documentation

## 2012-10-04 DIAGNOSIS — Z45018 Encounter for adjustment and management of other part of cardiac pacemaker: Secondary | ICD-10-CM | POA: Insufficient documentation

## 2012-10-04 DIAGNOSIS — Z862 Personal history of diseases of the blood and blood-forming organs and certain disorders involving the immune mechanism: Secondary | ICD-10-CM | POA: Insufficient documentation

## 2012-10-04 DIAGNOSIS — I4891 Unspecified atrial fibrillation: Secondary | ICD-10-CM | POA: Insufficient documentation

## 2012-10-04 DIAGNOSIS — Z9889 Other specified postprocedural states: Secondary | ICD-10-CM | POA: Insufficient documentation

## 2012-10-04 DIAGNOSIS — Z9861 Coronary angioplasty status: Secondary | ICD-10-CM | POA: Insufficient documentation

## 2012-10-04 DIAGNOSIS — Z794 Long term (current) use of insulin: Secondary | ICD-10-CM | POA: Insufficient documentation

## 2012-10-04 DIAGNOSIS — Z8673 Personal history of transient ischemic attack (TIA), and cerebral infarction without residual deficits: Secondary | ICD-10-CM | POA: Insufficient documentation

## 2012-10-04 DIAGNOSIS — T82128A Displacement of other cardiac electronic device, initial encounter: Secondary | ICD-10-CM

## 2012-10-04 DIAGNOSIS — Z79899 Other long term (current) drug therapy: Secondary | ICD-10-CM | POA: Insufficient documentation

## 2012-10-04 NOTE — ED Notes (Signed)
Used Medtronic on patient's pacemaker. Sent results via phone. Awaiting official results via fax.

## 2012-10-04 NOTE — ED Notes (Signed)
Assisted patient into hospital gown, placed callbell within reach and gave instructions on use.

## 2012-10-04 NOTE — ED Notes (Signed)
Per EMS - family states patient's "pacemaker is sticking out too far. They feel it is out of place." States patient has lost 25 lbs in the last three months.

## 2012-10-04 NOTE — ED Provider Notes (Addendum)
CSN: [id number]     Arrival date & time 10/04/12  2054 History  This chart was scribed for Ward Givens, MD by Dorothey Baseman, ED Scribe. This patient was seen in room APA18/APA18 and the patient's care was started at 10:22 PM.    Chief Complaint  Patient presents with  . Check pacemaker placement    The history is provided by the patient and a relative (son). No language interpreter was used.   HPI Comments: Cindy Abbott is a 79 y.o. female who presents to the Emergency Department requesting a check of pacemaker placement. Her son reports that her pacemaker was protruding from her chest and appeared to be out of position. He states it appeared to be standing on its side instead of laying flat. He states that the pacemaker appears normal now. He states that he manually took her pulse at home and it appeared normal. He reports that EMS stated the pacemaker appeared to be out of place. Her son reports that the patient had a colostomy on 09/21/2012 and was diagnosed with colon cancer. Her son states that she recently lost weight over 20 pounds and her pacemaker is much more visible now, but that her appetite has been gradually returning. Patient is currently placed in hospice care.   PCP In [place], [place] Cardiologist in [place], [place]  Past Medical History  Diagnosis Date  . Coronary artery disease   . Diabetes mellitus   . Pacemaker   . Asthma   . Stroke, acute, thrombotic 08/19/2011    Unknown when in the past but she has received TPA for a CVA  . Anemia   . Colon polyps     Followed by GI at [facility]. h/o numerous polyps 2008 with large one that could not be removed endoscopically. patient opted against surgical resection. yearly colonoscopies until 2011. previously on coumadin but was held due to slow GI bleed related to large polyp  . A-fib     previously on coumadin but withheld due to chronic GI bleed  . Cancer 2014    colon   Past Surgical History  Procedure Laterality Date  . Cardiac surgery     . Coronary angioplasty with stent placement    . Coronary artery bypass graft    . Abdominal hysterectomy    . Colostomy N/A 09/22/2012    Procedure: TRANSVERSE LOOP COLOSTOMY;  Surgeon: Dalia Heading, MD;  Location: [facility];  Service: General;  Laterality: N/A;  . Central venous catheter insertion Right 09/22/2012    Procedure: INSERTION CENTRAL LINE ADULT;  Surgeon: Dalia Heading, MD;  Location: [facility];  Service: General;  Laterality: Right;   Family History  Problem Relation Age of Onset  . Colon cancer Neg Hx   . Diabetes Son    History  Substance Use Topics  . Smoking status: Never Smoker   . Smokeless tobacco: Not on file  . Alcohol Use: No  lives at home Lives with son  OB History   Grav Para Term Preterm Abortions TAB SAB Ect Mult Living                 Review of Systems  A complete 10 system review of systems was obtained and all systems are negative except as noted in the HPI and PMH.   Allergies  Lisinopril  Home Medications   Current Outpatient Rx  Name  Route  Sig  Dispense  Refill  . aspirin 325 MG tablet   Oral  Take 325 mg by mouth every morning.          . cloNIDine (CATAPRES - DOSED IN MG/24 HR) 0.2 mg/24hr patch   Transdermal   Place 1 patch onto the skin once a week.          . insulin lispro (HUMALOG) 100 UNIT/ML injection   Subcutaneous   Inject 2-6 Units into the skin 3 (three) times daily before meals. Per sliding scale         . ipratropium-albuterol (DUONEB) 0.5-2.5 (3) MG/3ML SOLN   Nebulization   Take 3 mLs by nebulization every 6 (six) hours as needed (for shortness of breath).          . isosorbide mononitrate (IMDUR) 30 MG 24 hr tablet   Oral   Take 30 mg by mouth daily.         . metoprolol (LOPRESSOR) 50 MG tablet   Oral   Take 50 mg by mouth 2 (two) times daily.         Marland Kitchen oxyCODONE (OXY IR/ROXICODONE) 5 MG immediate release tablet   Oral   Take 1 tablet (5 mg total) by mouth every 6 (six) hours as needed  (moderate to severe pain).   60 tablet   0   . potassium chloride SA (K-DUR,KLOR-CON) 20 MEQ tablet   Oral   Take 20 mEq by mouth daily.         . bumetanide (BUMEX) 1 MG tablet   Oral   Take 1 mg by mouth daily as needed (for fluid retention).         . nitroGLYCERIN (NITROSTAT) 0.4 MG SL tablet   Sublingual   Place 0.4 mg under the tongue every 5 (five) minutes as needed for chest pain.          Triage Vitals: BP 170/59  Pulse 62  Temp(Src) 99.2 F (37.3 C) (Oral)  Resp 16  Ht 5\' 5"  (1.651 m)  Wt 105 lb (47.628 kg)  BMI 17.47 kg/m2  SpO2 100%  Vital signs normal except hypertension   Physical Exam  Nursing note and vitals reviewed. Constitutional: She is oriented to person, place, and time. She appears well-developed and well-nourished.  Non-toxic appearance. She does not appear ill. No distress.  Thin, underweight.   HENT:  Head: Normocephalic and atraumatic.  Nose: No mucosal edema or rhinorrhea.  Mouth/Throat: Oropharynx is clear and moist and mucous membranes are normal. No dental abscesses or edematous.  Eyes: Conjunctivae and EOM are normal. Pupils are equal, round, and reactive to light.  Neck: Normal range of motion and full passive range of motion without pain. Neck supple.  Cardiovascular: Normal rate and regular rhythm.  Exam reveals no gallop and no friction rub.   Murmur heard. Pulmonary/Chest: Effort normal. No respiratory distress. She has no rhonchi. She exhibits no crepitus.  Pacemaker is easily visible No swelling or bruising seen in the area.   Abdominal: Normal appearance.  Musculoskeletal: Normal range of motion. She exhibits no edema and no tenderness.  Moves all extremities well.   Neurological: She is alert and oriented to person, place, and time. She has normal strength. No cranial nerve deficit.  Skin: Skin is warm, dry and intact. No rash noted. No erythema. No pallor.  Psychiatric: She has a normal mood and affect. Her speech is  normal and behavior is normal. Her mood appears not anxious.    ED Course  Procedures (including critical care time)  DIAGNOSTIC STUDIES: Oxygen Saturation is  100% on room air, normal by my interpretation.    COORDINATION OF CARE: 10:26PM- Discussed normal chest x-ray results indicate that the pacemaker is in proper placement. Discussed treatment plan with patient and her son at bedside and her son verbalized agreement on the patient's behalf.   Medtronic interrogation done and shows no problem with the pacemaker. They recommend she see her cardiologist tomorrow, not to lay on her left side. If it happens again they should take a picture and if asymptomatic can wait and see her cardiologist the next day.  Son given copies of her EKG tonight, the medtronic report and her chest xray   Imaging Review Dg Chest 2 View  10/04/2012   CLINICAL DATA:  Recent weight loss ; questionable pacemaker displacement  EXAM: CHEST  2 VIEW  COMPARISON:  September 24, 2012  FINDINGS: Pacemaker lead is attached to the right ventricle. The pacemaker position does not appear appreciably changed compared to recent prior study.  No pneumothorax. No edema or consolidation. Heart is mildly enlarged with normal pulmonary vascularity. There is atherosclerotic change in the aorta. No adenopathy. Patient is status post coronary artery bypass graft  IMPRESSION: Pacemaker lead position does not appear demonstrable unchanged compared to recent prior study. The heart is mildly enlarged with stable. There is atherosclerotic change in the aorta. There is no edema or consolidation.   Electronically Signed   By: Bretta Bang   On: 10/04/2012 21:48    Date: 10/04/2012  Rate: 60  Rhythm: Ventricular paced rhythm   Old EKG Reviewed: unchanged from 09/21/2012    MDM   1. Pacemaker displacement, initial encounter     Plan discharge   Devoria Albe, MD, FACEP    I personally performed the services described in this  documentation, which was scribed in my presence. The recorded information has been reviewed and considered.  Devoria Albe, MD, Armando Gang    Ward Givens, MD 10/04/12 1610  Ward Givens, MD 10/04/12 [phone]

## 2012-11-05 ENCOUNTER — Encounter (HOSPITAL_COMMUNITY): Payer: Self-pay | Admitting: Emergency Medicine

## 2012-11-05 ENCOUNTER — Emergency Department (HOSPITAL_COMMUNITY)
Admission: EM | Admit: 2012-11-05 | Discharge: 2012-11-05 | Disposition: A | Discharge status: Hospice | Attending: Emergency Medicine | Admitting: Emergency Medicine

## 2012-11-05 DIAGNOSIS — Z9861 Coronary angioplasty status: Secondary | ICD-10-CM | POA: Insufficient documentation

## 2012-11-05 DIAGNOSIS — Y833 Surgical operation with formation of external stoma as the cause of abnormal reaction of the patient, or of later complication, without mention of misadventure at the time of the procedure: Secondary | ICD-10-CM | POA: Insufficient documentation

## 2012-11-05 DIAGNOSIS — K9403 Colostomy malfunction: Secondary | ICD-10-CM | POA: Insufficient documentation

## 2012-11-05 DIAGNOSIS — Z860100 Personal history of colon polyps, unspecified: Secondary | ICD-10-CM | POA: Insufficient documentation

## 2012-11-05 DIAGNOSIS — C785 Secondary malignant neoplasm of large intestine and rectum: Secondary | ICD-10-CM | POA: Insufficient documentation

## 2012-11-05 DIAGNOSIS — Z862 Personal history of diseases of the blood and blood-forming organs and certain disorders involving the immune mechanism: Secondary | ICD-10-CM | POA: Insufficient documentation

## 2012-11-05 DIAGNOSIS — I2510 Atherosclerotic heart disease of native coronary artery without angina pectoris: Secondary | ICD-10-CM | POA: Insufficient documentation

## 2012-11-05 DIAGNOSIS — Z794 Long term (current) use of insulin: Secondary | ICD-10-CM | POA: Insufficient documentation

## 2012-11-05 DIAGNOSIS — Z7982 Long term (current) use of aspirin: Secondary | ICD-10-CM | POA: Insufficient documentation

## 2012-11-05 DIAGNOSIS — Z951 Presence of aortocoronary bypass graft: Secondary | ICD-10-CM | POA: Insufficient documentation

## 2012-11-05 DIAGNOSIS — Z79899 Other long term (current) drug therapy: Secondary | ICD-10-CM | POA: Insufficient documentation

## 2012-11-05 DIAGNOSIS — Z8673 Personal history of transient ischemic attack (TIA), and cerebral infarction without residual deficits: Secondary | ICD-10-CM | POA: Insufficient documentation

## 2012-11-05 DIAGNOSIS — K9413 Enterostomy malfunction: Secondary | ICD-10-CM | POA: Insufficient documentation

## 2012-11-05 DIAGNOSIS — C799 Secondary malignant neoplasm of unspecified site: Secondary | ICD-10-CM

## 2012-11-05 DIAGNOSIS — J45909 Unspecified asthma, uncomplicated: Secondary | ICD-10-CM | POA: Insufficient documentation

## 2012-11-05 DIAGNOSIS — Z950 Presence of cardiac pacemaker: Secondary | ICD-10-CM | POA: Insufficient documentation

## 2012-11-05 DIAGNOSIS — F0390 Unspecified dementia without behavioral disturbance: Secondary | ICD-10-CM | POA: Insufficient documentation

## 2012-11-05 DIAGNOSIS — Z90710 Acquired absence of both cervix and uterus: Secondary | ICD-10-CM | POA: Insufficient documentation

## 2012-11-05 DIAGNOSIS — E119 Type 2 diabetes mellitus without complications: Secondary | ICD-10-CM | POA: Insufficient documentation

## 2012-11-05 LAB — GLUCOSE, CAPILLARY: Glucose-Capillary: 186 mg/dL — ABNORMAL HIGH (ref 70–99)

## 2012-11-05 MED ORDER — ALPRAZOLAM 0.25 MG PO TABS: 0.2500 mg | ORAL_TABLET | ORAL | Status: AC | PRN

## 2012-11-05 NOTE — ED Notes (Signed)
Pt arrived by ems from home, she is a hospice pt who is being taken care of by her son. During the night she pulled off her colostomy bag.  Son found her with stool and blood covering her and her bed. Pt is confused which is her normal.   Son stated that she is going into respite care in the am.   At arrival pt was cleaned of blood/stool from her hands and body. New colostomy bag placed over stoma.

## 2012-11-05 NOTE — Progress Notes (Signed)
89 yr. Old African American S/P loop colostomy in Sept in hospice care who became agitated and removed bar on colostomy device.  There is no active bleeding or obstruction and periphery of stoma is adhesied and mildly edematous.  Did not need to try and reinsert bar.  Pt is very stable and tried to explain to son that she could not be admitted to acute care and that she should return to hospice care.  Suggested to Dr. Rosalia Hammers the need for something for her periotic agitation  Son  Will follow up with Dr.Jenkins as OP.  Filed Vitals:   11/05/12 0931  BP: 160/80  Pulse:   Temp:   Resp:   .

## 2012-11-05 NOTE — ED Notes (Signed)
Pt arrived by cc-ems from home.  Son takes care of her.  Woke this am to find that the pt had pulled her colostomy out, pt is bleeding from her ab. Controlled w/ pressure dressing. Pt has end stage colon cancer. Is on hospice care and is confused.

## 2012-11-05 NOTE — ED Notes (Signed)
[facility] called for transport to [facility] in [place]

## 2012-11-05 NOTE — ED Notes (Signed)
rn spoke to [facility] for [facility] [place].  The pt has been accepted at the hospice home of [facility] and she can be discharged to the facility at this time.

## 2012-11-05 NOTE — ED Provider Notes (Addendum)
CSN: [id number]     Arrival date & time 11/05/12  4332 History   This chart was scribed for Hilario Quarry, MD by Valera Castle, ED Scribe. This patient was seen in room APA18/APA18 and the patient's care was started at 7:38 AM.    Chief Complaint  Patient presents with  . Abdominal Pain   Level 5 Caveat - Pt's Condition  The history is provided by the patient. The history is limited by the condition of the patient (dementia, confusion). No language interpreter was used.   HPI Comments: Cindy Abbott is a 79 y.o. female with a h/o colon cancer, brought in by EMS who presents to the Emergency Department complaining of sudden, moderate bleeding from a wound in her abdomen. Her son takes care of her at home and woke up this morning to find that she had pulled out her stent. She is on hospice care and is confused.   Past Medical History  Diagnosis Date  . Coronary artery disease   . Diabetes mellitus   . Pacemaker   . Asthma   . Stroke, acute, thrombotic 08/19/2011    Unknown when in the past but she has received TPA for a CVA  . Anemia   . Colon polyps     Followed by GI at [facility] h/o numerous polyps 2008 with large one that could not be removed endoscopically. patient opted against surgical resection. yearly colonoscopies until 2011. previously on coumadin but was held due to slow GI bleed related to large polyp  . A-fib     previously on coumadin but withheld due to chronic GI bleed  . Cancer 2014    colon   Past Surgical History  Procedure Laterality Date  . Cardiac surgery    . Coronary angioplasty with stent placement    . Coronary artery bypass graft    . Abdominal hysterectomy    . Colostomy N/A 09/22/2012    Procedure: TRANSVERSE LOOP COLOSTOMY;  Surgeon: Dalia Heading, MD;  Location: [facility];  Service: General;  Laterality: N/A;  . Central venous catheter insertion Right 09/22/2012    Procedure: INSERTION CENTRAL LINE ADULT;  Surgeon: Dalia Heading, MD;  Location: [facility];   Service: General;  Laterality: Right;   Family History  Problem Relation Age of Onset  . Colon cancer Neg Hx   . Diabetes Son    History  Substance Use Topics  . Smoking status: Never Smoker   . Smokeless tobacco: Not on file  . Alcohol Use: No   OB History   Grav Para Term Preterm Abortions TAB SAB Ect Mult Living                 Review of Systems  Unable to perform ROS: Dementia    Allergies  Lisinopril  Home Medications   Current Outpatient Rx  Name  Route  Sig  Dispense  Refill  . aspirin 325 MG tablet   Oral   Take 325 mg by mouth every morning.          . bumetanide (BUMEX) 1 MG tablet   Oral   Take 1 mg by mouth daily as needed (for fluid retention).         . EXPIRED: cloNIDine (CATAPRES - DOSED IN MG/24 HR) 0.2 mg/24hr patch   Transdermal   Place 1 patch onto the skin once a week.          . insulin lispro (HUMALOG) 100 UNIT/ML injection  Subcutaneous   Inject 2-6 Units into the skin 3 (three) times daily before meals. Per sliding scale         . ipratropium-albuterol (DUONEB) 0.5-2.5 (3) MG/3ML SOLN   Nebulization   Take 3 mLs by nebulization every 6 (six) hours as needed (for shortness of breath).          . isosorbide mononitrate (IMDUR) 30 MG 24 hr tablet   Oral   Take 30 mg by mouth daily.         . metoprolol (LOPRESSOR) 50 MG tablet   Oral   Take 50 mg by mouth 2 (two) times daily.         . nitroGLYCERIN (NITROSTAT) 0.4 MG SL tablet   Sublingual   Place 0.4 mg under the tongue every 5 (five) minutes as needed for chest pain.         Marland Kitchen oxyCODONE (OXY IR/ROXICODONE) 5 MG immediate release tablet   Oral   Take 1 tablet (5 mg total) by mouth every 6 (six) hours as needed (moderate to severe pain).   60 tablet   0   . potassium chloride SA (K-DUR,KLOR-CON) 20 MEQ tablet   Oral   Take 20 mEq by mouth daily.          Triage Vitals: BP 178/84  Pulse 61  Temp(Src) 99.9 F (37.7 C) (Rectal)  Resp 18  SpO2  94%  Physical Exam  Nursing note and vitals reviewed. Constitutional: She appears well-developed.  Chronically ill appearing  HENT:  Head: Normocephalic.  Mouth/Throat: Oropharynx is clear and moist.  Neck: Normal range of motion.  Cardiovascular: Normal rate, normal heart sounds and intact distal pulses.   Pulmonary/Chest: Effort normal and breath sounds normal.  Abdominal:  Ostomy site no bleeding, diffuse mild ttp  Neurological: She is alert.  Moans to pain, does not verbalize responses  Skin: Skin is warm and dry.    ED Course  Procedures (including critical care time)  DIAGNOSTIC STUDIES: Oxygen Saturation is 94% on room air, normal by my interpretation.    COORDINATION OF CARE: 7:42 AM-Discussed treatment plan which includes Glucose, capillary with pt at bedside and pt agreed to plan.   Labs Review Labs Reviewed  GLUCOSE, CAPILLARY - Abnormal; Notable for the following:    Glucose-Capillary 186 (*)    All other components within normal limits   Imaging Review No results found.  EKG Interpretation   None      No orders of the defined types were placed in this encounter.     MDM  No diagnosis found. 79 year old female with presumed colon cancer metastatic disease presents today after pulling out the bar and her ostomy. Dr. Malvin Johns from general surgery has consulted on the patient. The patient is stable with no obstruction. The patient is being cared for at home but the son has apparently unable to care for her at this time. We have [facility] and in the patient has been accepted to their palliative care unit in [facility] be at the hospice home. The accepting physician is Dr. Yetta Flock.  Patient's family is aware and in agreement.    Hilario Quarry, MD 11/05/12 1032  I personally performed the services described in this documentation, which was scribed in my presence. The recorded information has been reviewed and  considered.   Hilario Quarry, MD 10-Dec-2012 1610  Hilario Quarry, MD 12/07/12 9604  Hilario Quarry, MD 01/09/13 [phone]

## 2012-11-05 NOTE — ED Notes (Signed)
ems arrived for transport. Pt left by ems

## 2012-11-05 NOTE — ED Notes (Signed)
Report called to jo, rn at the hospice home, [phone]

## 2012-11-05 NOTE — ED Notes (Signed)
Son at bedside, pt resting in bed at present, requesting that his mother bed. He is requesting that his mother be admitted to the hosp. Because her room is dirty at home.  She is going into respite care in the am.  To "give me a break"

## 2012-12-04 DEATH — deceased

## 2014-11-04 IMAGING — CT CT ABD-PELV W/ CM
2 of 5 series · 15 of 46 positions shown, 17 images · IV contrast (Omnipaque 300)
Comparison: CT scan 08/10/2012

CLINICAL DATA: Abdominal pain, vomiting

EXAM:
CT ABDOMEN AND PELVIS WITH CONTRAST
TECHNIQUE: Multidetector CT imaging of the abdomen and pelvis was performed
using the standard protocol following bolus administration of
intravenous contrast.
CONTRAST:  100mL OMNIPAQUE IOHEXOL 300 MG/ML  SOLN

[Series 2: abd_pel_with 5.0 b40f · axial · 0.66mm/px · z∈[-332,+4]mm · 12 of 77 slices shown, 14 images]
[im 5/77  soft-tissue]
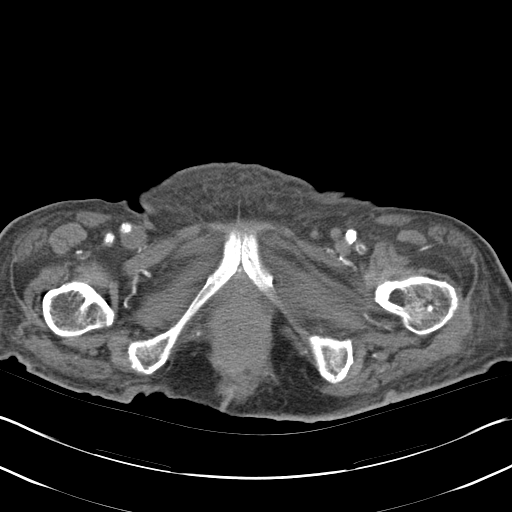
[im 5/77  bone]
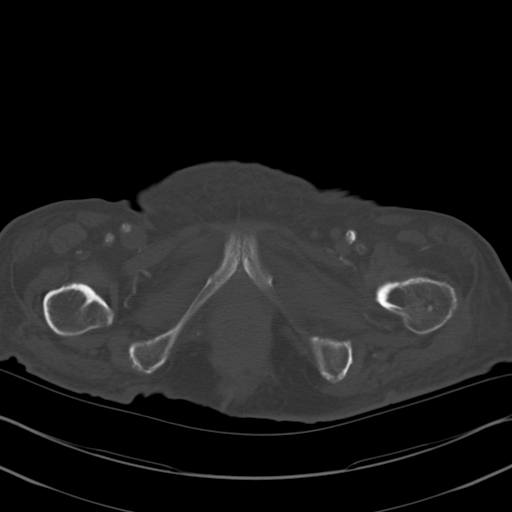
[im 13/77  soft-tissue]
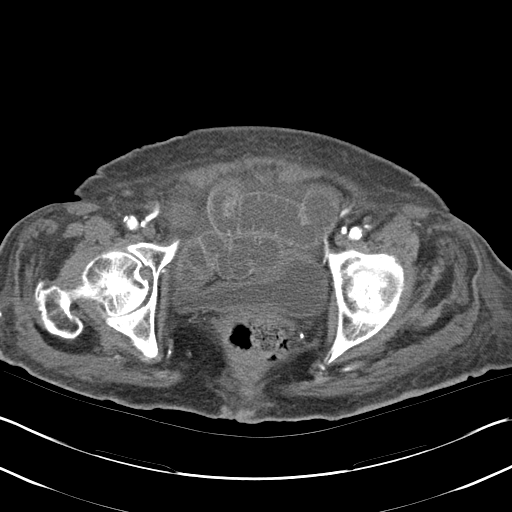
[im 17/77  soft-tissue]
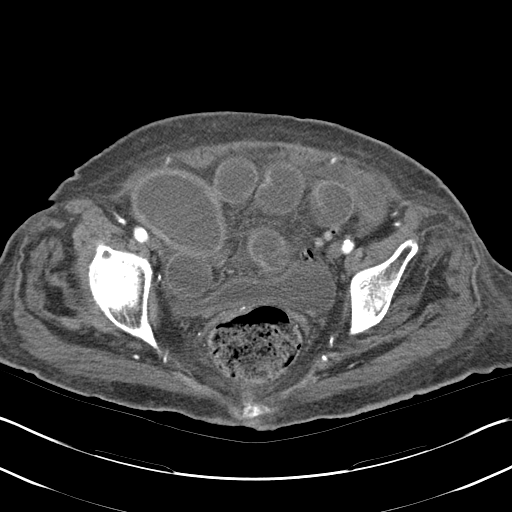
[im 22/77  soft-tissue]
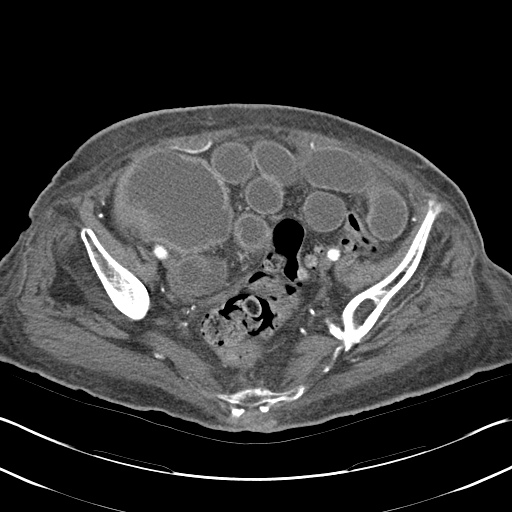
[im 30/77  soft-tissue]
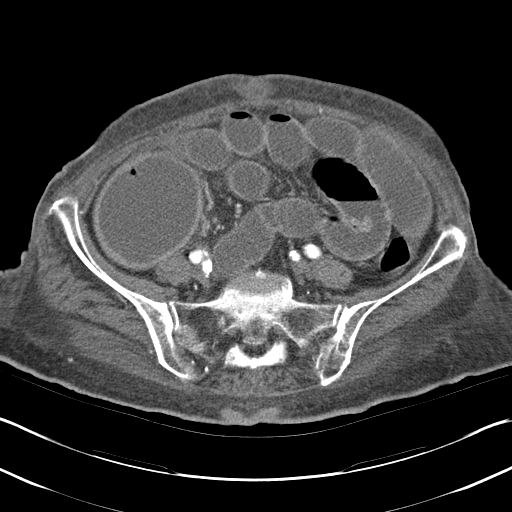
[im 34/77  soft-tissue]
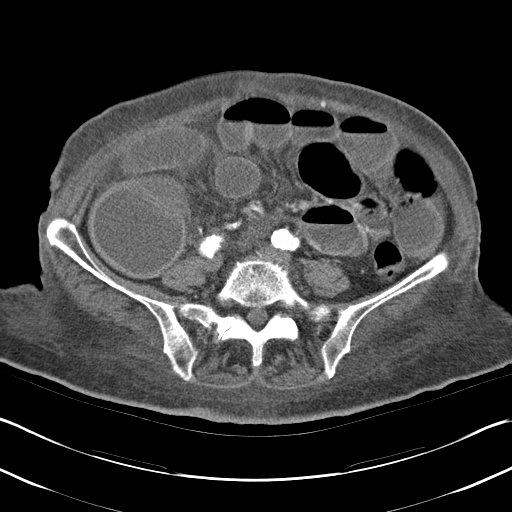
[im 43/77  soft-tissue]
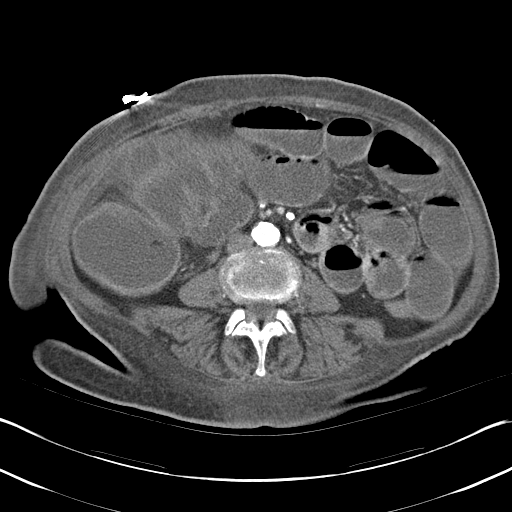
[im 47/77  soft-tissue]
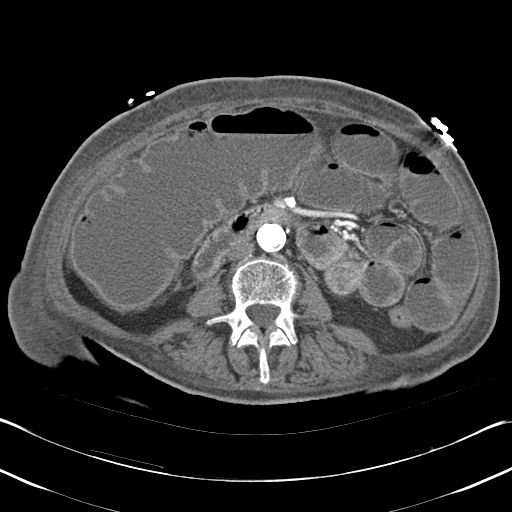
[im 55/77  soft-tissue]
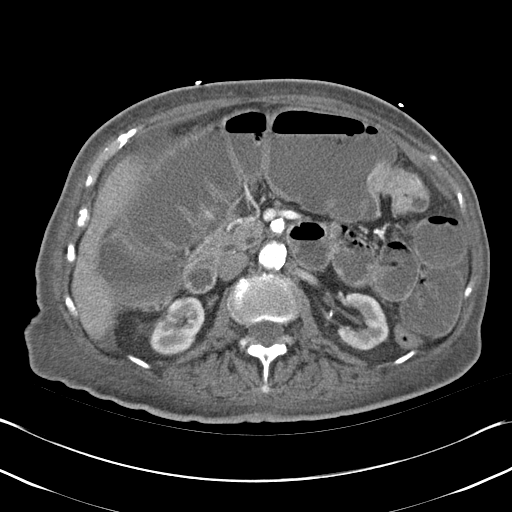
[im 55/77  bone]
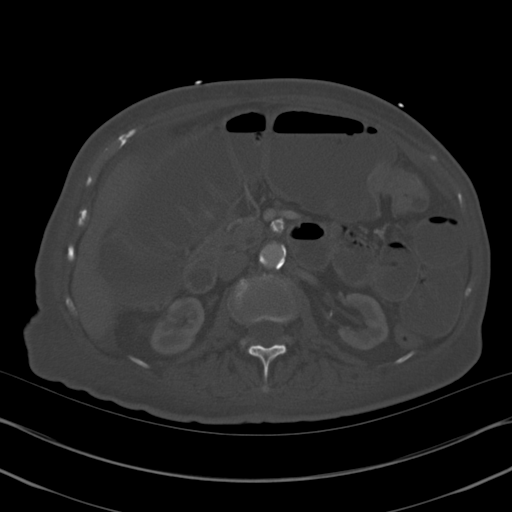
[im 60/77  soft-tissue]
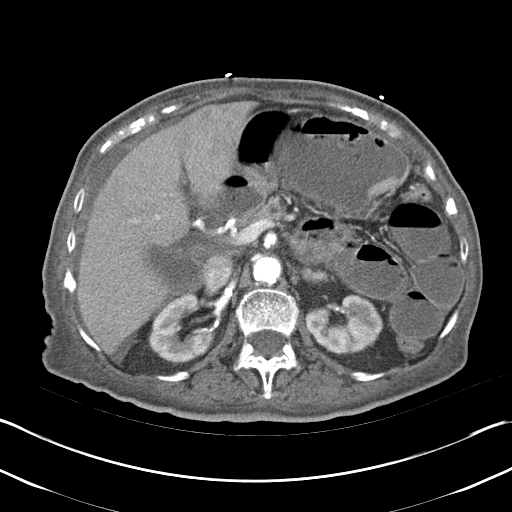
[im 64/77  soft-tissue]
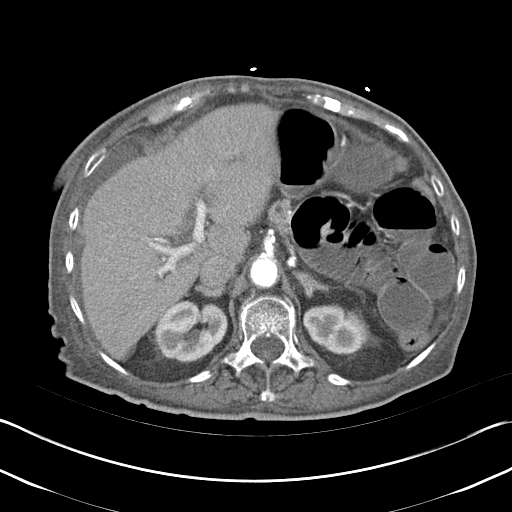
[im 72/77  soft-tissue]
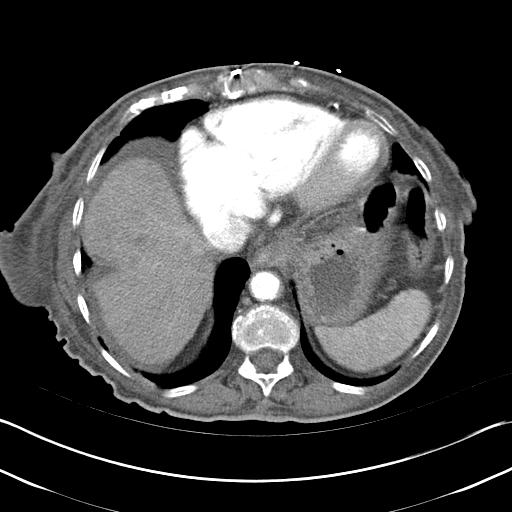

[Series 4: abd_pel_with 3.0 spo · coronal · 0.67mm/px · 3 of 80 slices shown]
[im 27/80  soft-tissue]
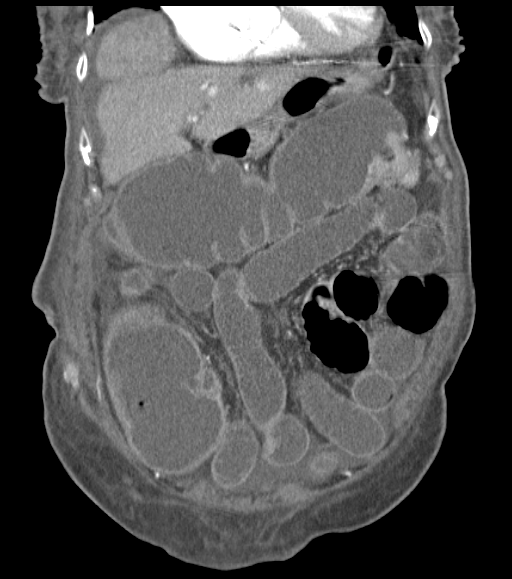
[im 36/80  soft-tissue]
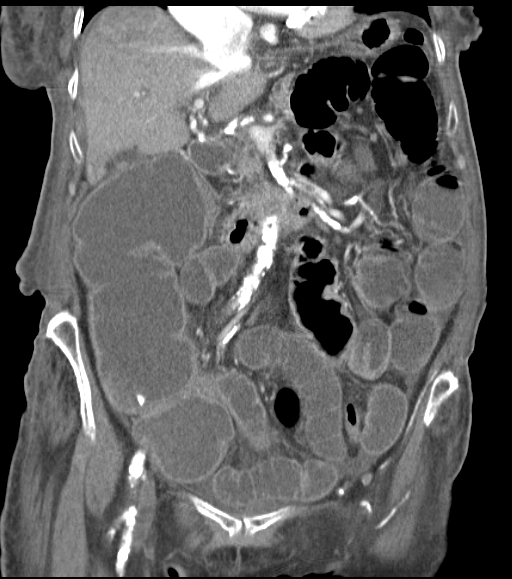
[im 44/80  soft-tissue]
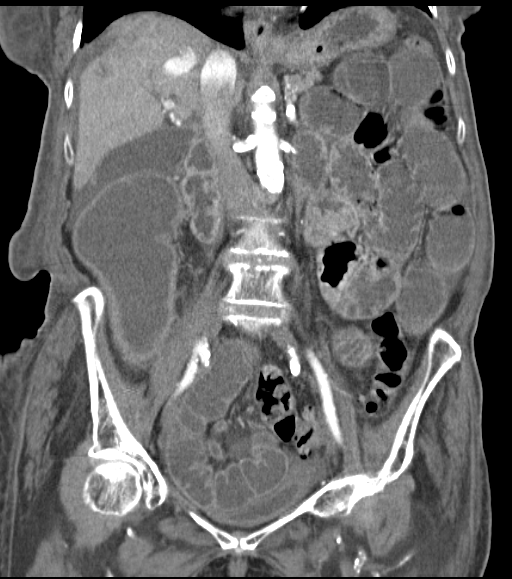

[15 of 46 positions shown; findings below may reference images not displayed]

FINDINGS: Cardiomegaly is noted. Sagittal images of the spine shows diffuse
osteopenia. No destructive bony lesions are noted.

There is perihepatic ascites. There is progression in size and
number of liver metastatic disease. The largest lesion in the right
hepatic dome measures 4.2 cm increased in size from prior exam.
There are additional at least 2 lesions in right hepatic dome. The
largest lesion in left hepatic lobe anteriorly measures 4.4 cm.
Stable from prior exam.

Atherosclerotic calcifications of the abdominal aorta, splenic
artery, hepatic artery SMA and iliac arteries again noted. No
evidence of aortic aneurysm.

The pancreas, spleen and adrenal glands are unremarkable. There are
multiple distended small bowel loops throughout the abdomen with
multiple air-fluid levels consistent with significant ileus or
partial small bowel obstruction. Mild distended gallbladder. Mild
pericholecystic fluid. No calcified gallstones are noted within
gallbladder. There is significant distension of the right colon and
transverse colon with fluid. There is a obstructing apple core
malignant lesion in distal transverse colon best seen in axial image
22 measures 3.6 cm in length by 2.3 cm. Findings are consistent with
colonic obstruction.

The left colon and sigmoid colon is collapsed with small caliber.
Some residual stool noted within distal sigmoid colon and rectum.

Enhanced kidneys are symmetrical in size. No hydronephrosis or
hydroureter. Mild bilateral cortical thinning probable due to
atrophy.

Delayed renal images shows bilateral renal symmetrical excretion.
Bilateral visualized proximal ureter is unremarkable.
IMPRESSION: 1. There are multiple distended small bowel loops throughout the
abdomen with multiple air-fluid levels consistent with significant
ileus or partial small bowel obstruction. Mild distended
gallbladder. Mild pericholecystic fluid. No calcified gallstones are
noted within gallbladder. There is significant distension of the
right colon and transverse colon with fluid. There is a obstructing
apple core malignant lesion in distal transverse colon best seen in
axial image 22 measures 3.6 cm in length by 2.3 cm. Findings are
consistent with colonic obstruction.

2. No hydronephrosis or hydroureter.
3. Small amount of perihepatic ascites.
4. Progression of metastatic disease within the liver.

CriticalValue/emergent results were called by telephone at the time
of interpretation on 09/19/2012 at [DATE] Izuho Dalziel , who
verbally acknowledged these results.
